# Patient Record
Sex: Female | Born: 1965
Health system: Southern US, Community
[De-identification: ages and names within clinical notes are randomized; demographics above are authoritative.]

## PROBLEM LIST (undated history)

## (undated) DIAGNOSIS — M722 Plantar fascial fibromatosis: Secondary | ICD-10-CM

## (undated) DIAGNOSIS — R7303 Prediabetes: Secondary | ICD-10-CM

## (undated) DIAGNOSIS — Z9289 Personal history of other medical treatment: Secondary | ICD-10-CM

## (undated) DIAGNOSIS — K219 Gastro-esophageal reflux disease without esophagitis: Secondary | ICD-10-CM

## (undated) DIAGNOSIS — C9590 Leukemia, unspecified not having achieved remission: Secondary | ICD-10-CM

## (undated) DIAGNOSIS — K589 Irritable bowel syndrome without diarrhea: Secondary | ICD-10-CM

## (undated) DIAGNOSIS — Z8719 Personal history of other diseases of the digestive system: Secondary | ICD-10-CM

## (undated) DIAGNOSIS — C4490 Unspecified malignant neoplasm of skin, unspecified: Secondary | ICD-10-CM

## (undated) DIAGNOSIS — E041 Nontoxic single thyroid nodule: Secondary | ICD-10-CM

## (undated) DIAGNOSIS — K6289 Other specified diseases of anus and rectum: Secondary | ICD-10-CM

## (undated) DIAGNOSIS — D649 Anemia, unspecified: Secondary | ICD-10-CM

## (undated) HISTORY — DX: Other specified diseases of anus and rectum: K62.89

## (undated) HISTORY — PX: PLANTAR FASCIA SURGERY: SHX746

## (undated) HISTORY — DX: Anemia, unspecified: D64.9

## (undated) HISTORY — DX: Plantar fascial fibromatosis: M72.2

## (undated) HISTORY — DX: Unspecified malignant neoplasm of skin, unspecified: C44.90

## (undated) HISTORY — DX: Leukemia, unspecified not having achieved remission: C95.90

## (undated) HISTORY — DX: Nontoxic single thyroid nodule: E04.1

## (undated) HISTORY — DX: Irritable bowel syndrome, unspecified: K58.9

## (undated) HISTORY — DX: Gastro-esophageal reflux disease without esophagitis: K21.9

## (undated) HISTORY — PX: RADICAL HYSTERECTOMY: SHX2283

---
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
## 1997-09-24 HISTORY — PX: NASAL SINUS SURGERY: SHX719

## 1998-02-08 ENCOUNTER — Other Ambulatory Visit: Admission: RE | Admit: 1998-02-08 | Discharge: 1998-02-08 | Payer: Self-pay | Admitting: *Deleted

## 1999-10-12 ENCOUNTER — Other Ambulatory Visit: Admission: RE | Admit: 1999-10-12 | Discharge: 1999-10-12 | Payer: Self-pay | Admitting: Obstetrics and Gynecology

## 2000-10-15 ENCOUNTER — Other Ambulatory Visit: Admission: RE | Admit: 2000-10-15 | Discharge: 2000-10-15 | Payer: Self-pay | Admitting: Obstetrics and Gynecology

## 2000-11-05 ENCOUNTER — Ambulatory Visit (HOSPITAL_COMMUNITY): Admission: RE | Admit: 2000-11-05 | Discharge: 2000-11-05 | Payer: Self-pay | Admitting: Internal Medicine

## 2000-11-05 ENCOUNTER — Encounter: Payer: Self-pay | Admitting: Internal Medicine

## 2001-09-24 HISTORY — PX: ABDOMINAL HYSTERECTOMY: SHX81

## 2001-09-25 ENCOUNTER — Ambulatory Visit (HOSPITAL_COMMUNITY): Admission: RE | Admit: 2001-09-25 | Discharge: 2001-09-25 | Payer: Self-pay | Admitting: Gynecology

## 2001-09-25 ENCOUNTER — Encounter (INDEPENDENT_AMBULATORY_CARE_PROVIDER_SITE_OTHER): Payer: Self-pay

## 2001-11-04 ENCOUNTER — Other Ambulatory Visit: Admission: RE | Admit: 2001-11-04 | Discharge: 2001-11-04 | Payer: Self-pay | Admitting: Gynecology

## 2002-03-19 ENCOUNTER — Inpatient Hospital Stay (HOSPITAL_COMMUNITY): Admission: RE | Admit: 2002-03-19 | Discharge: 2002-03-22 | Payer: Self-pay | Admitting: Gynecology

## 2002-03-19 ENCOUNTER — Encounter (INDEPENDENT_AMBULATORY_CARE_PROVIDER_SITE_OTHER): Payer: Self-pay | Admitting: Specialist

## 2002-09-24 HISTORY — PX: OTHER SURGICAL HISTORY: SHX169

## 2002-10-13 ENCOUNTER — Other Ambulatory Visit: Admission: RE | Admit: 2002-10-13 | Discharge: 2002-10-13 | Payer: Self-pay | Admitting: Gynecology

## 2003-07-12 ENCOUNTER — Emergency Department (HOSPITAL_COMMUNITY): Admission: AD | Admit: 2003-07-12 | Discharge: 2003-07-12 | Payer: Self-pay | Admitting: Family Medicine

## 2003-12-01 ENCOUNTER — Other Ambulatory Visit: Admission: RE | Admit: 2003-12-01 | Discharge: 2003-12-01 | Payer: Self-pay | Admitting: Gynecology

## 2005-01-09 ENCOUNTER — Other Ambulatory Visit: Admission: RE | Admit: 2005-01-09 | Discharge: 2005-01-09 | Payer: Self-pay | Admitting: Gynecology

## 2005-12-20 ENCOUNTER — Ambulatory Visit (HOSPITAL_COMMUNITY): Admission: RE | Admit: 2005-12-20 | Discharge: 2005-12-20 | Payer: Self-pay | Admitting: Gynecology

## 2006-01-14 ENCOUNTER — Other Ambulatory Visit: Admission: RE | Admit: 2006-01-14 | Discharge: 2006-01-14 | Payer: Self-pay | Admitting: Gynecology

## 2006-12-24 ENCOUNTER — Ambulatory Visit (HOSPITAL_COMMUNITY): Admission: RE | Admit: 2006-12-24 | Discharge: 2006-12-24 | Payer: Self-pay | Admitting: Gynecology

## 2007-01-16 ENCOUNTER — Other Ambulatory Visit: Admission: RE | Admit: 2007-01-16 | Discharge: 2007-01-16 | Payer: Self-pay | Admitting: Gynecology

## 2007-03-24 ENCOUNTER — Encounter: Admission: RE | Admit: 2007-03-24 | Discharge: 2007-03-24 | Payer: Self-pay | Admitting: Family Medicine

## 2007-12-14 ENCOUNTER — Encounter: Admission: RE | Admit: 2007-12-14 | Discharge: 2007-12-14 | Payer: Self-pay | Admitting: Family Medicine

## 2007-12-31 ENCOUNTER — Ambulatory Visit (HOSPITAL_COMMUNITY): Admission: RE | Admit: 2007-12-31 | Discharge: 2007-12-31 | Payer: Self-pay | Admitting: Gynecology

## 2008-01-19 ENCOUNTER — Other Ambulatory Visit: Admission: RE | Admit: 2008-01-19 | Discharge: 2008-01-19 | Payer: Self-pay | Admitting: Gynecology

## 2008-04-28 ENCOUNTER — Emergency Department (HOSPITAL_COMMUNITY): Admission: EM | Admit: 2008-04-28 | Discharge: 2008-04-29 | Payer: Self-pay | Admitting: Emergency Medicine

## 2008-11-10 ENCOUNTER — Ambulatory Visit: Payer: Self-pay | Admitting: Women's Health

## 2008-12-31 ENCOUNTER — Ambulatory Visit (HOSPITAL_COMMUNITY): Admission: RE | Admit: 2008-12-31 | Discharge: 2008-12-31 | Payer: Self-pay | Admitting: Gynecology

## 2009-01-24 ENCOUNTER — Ambulatory Visit: Payer: Self-pay | Admitting: Women's Health

## 2009-01-24 ENCOUNTER — Other Ambulatory Visit: Admission: RE | Admit: 2009-01-24 | Discharge: 2009-01-24 | Payer: Self-pay | Admitting: Gynecology

## 2009-01-24 ENCOUNTER — Encounter: Payer: Self-pay | Admitting: Women's Health

## 2009-02-09 ENCOUNTER — Ambulatory Visit: Payer: Self-pay | Admitting: Obstetrics and Gynecology

## 2009-09-21 ENCOUNTER — Encounter: Admission: RE | Admit: 2009-09-21 | Discharge: 2009-09-21 | Payer: Self-pay | Admitting: Gastroenterology

## 2009-11-22 ENCOUNTER — Emergency Department (HOSPITAL_COMMUNITY): Admission: EM | Admit: 2009-11-22 | Discharge: 2009-11-22 | Payer: Self-pay | Admitting: Family Medicine

## 2009-11-22 HISTORY — PX: BIOPSY THYROID: PRO38

## 2009-11-30 ENCOUNTER — Other Ambulatory Visit: Admission: RE | Admit: 2009-11-30 | Discharge: 2009-11-30 | Payer: Self-pay | Admitting: Diagnostic Radiology

## 2009-11-30 ENCOUNTER — Encounter: Admission: RE | Admit: 2009-11-30 | Discharge: 2009-11-30 | Payer: Self-pay | Admitting: Family Medicine

## 2009-12-30 ENCOUNTER — Encounter: Admission: RE | Admit: 2009-12-30 | Discharge: 2009-12-30 | Payer: Self-pay | Admitting: Gynecology

## 2010-01-25 ENCOUNTER — Other Ambulatory Visit: Admission: RE | Admit: 2010-01-25 | Discharge: 2010-01-25 | Payer: Self-pay | Admitting: Obstetrics and Gynecology

## 2010-01-25 ENCOUNTER — Ambulatory Visit: Payer: Self-pay | Admitting: Women's Health

## 2010-05-11 ENCOUNTER — Ambulatory Visit: Payer: Self-pay | Admitting: Women's Health

## 2010-11-29 ENCOUNTER — Ambulatory Visit
Admission: RE | Admit: 2010-11-29 | Discharge: 2010-11-29 | Disposition: A | Discharge status: Home / Self Care | Payer: BC Managed Care – PPO | Source: Ambulatory Visit | Attending: Allergy | Admitting: Allergy

## 2010-11-29 ENCOUNTER — Other Ambulatory Visit: Payer: Self-pay | Admitting: Allergy

## 2010-11-29 DIAGNOSIS — R05 Cough: Secondary | ICD-10-CM

## 2010-11-29 DIAGNOSIS — R062 Wheezing: Secondary | ICD-10-CM

## 2010-12-12 ENCOUNTER — Ambulatory Visit (INDEPENDENT_AMBULATORY_CARE_PROVIDER_SITE_OTHER): Payer: BC Managed Care – PPO | Admitting: Gynecology

## 2010-12-12 DIAGNOSIS — N898 Other specified noninflammatory disorders of vagina: Secondary | ICD-10-CM

## 2010-12-12 DIAGNOSIS — B373 Candidiasis of vulva and vagina: Secondary | ICD-10-CM

## 2010-12-12 DIAGNOSIS — M549 Dorsalgia, unspecified: Secondary | ICD-10-CM

## 2010-12-13 ENCOUNTER — Other Ambulatory Visit: Payer: Self-pay | Admitting: Gynecology

## 2010-12-13 DIAGNOSIS — Z1231 Encounter for screening mammogram for malignant neoplasm of breast: Secondary | ICD-10-CM

## 2011-01-01 ENCOUNTER — Ambulatory Visit
Admission: RE | Admit: 2011-01-01 | Discharge: 2011-01-01 | Disposition: A | Discharge status: Home / Self Care | Payer: BC Managed Care – PPO | Source: Ambulatory Visit | Attending: Gynecology | Admitting: Gynecology

## 2011-01-01 DIAGNOSIS — Z1231 Encounter for screening mammogram for malignant neoplasm of breast: Secondary | ICD-10-CM

## 2011-01-10 ENCOUNTER — Ambulatory Visit (INDEPENDENT_AMBULATORY_CARE_PROVIDER_SITE_OTHER): Payer: BC Managed Care – PPO | Admitting: Women's Health

## 2011-01-10 DIAGNOSIS — N898 Other specified noninflammatory disorders of vagina: Secondary | ICD-10-CM

## 2011-01-10 DIAGNOSIS — N3941 Urge incontinence: Secondary | ICD-10-CM

## 2011-01-10 DIAGNOSIS — R350 Frequency of micturition: Secondary | ICD-10-CM

## 2011-01-10 DIAGNOSIS — B373 Candidiasis of vulva and vagina: Secondary | ICD-10-CM

## 2011-01-10 DIAGNOSIS — R82998 Other abnormal findings in urine: Secondary | ICD-10-CM

## 2011-01-23 ENCOUNTER — Encounter (INDEPENDENT_AMBULATORY_CARE_PROVIDER_SITE_OTHER): Payer: Self-pay | Admitting: Surgery

## 2011-01-29 ENCOUNTER — Other Ambulatory Visit: Payer: Self-pay | Admitting: Women's Health

## 2011-01-29 ENCOUNTER — Encounter (INDEPENDENT_AMBULATORY_CARE_PROVIDER_SITE_OTHER): Payer: BC Managed Care – PPO | Admitting: Women's Health

## 2011-01-29 ENCOUNTER — Other Ambulatory Visit (HOSPITAL_COMMUNITY)
Admission: RE | Admit: 2011-01-29 | Discharge: 2011-01-29 | Disposition: A | Discharge status: Home / Self Care | Payer: BC Managed Care – PPO | Source: Ambulatory Visit | Attending: Gynecology | Admitting: Gynecology

## 2011-01-29 DIAGNOSIS — Z01419 Encounter for gynecological examination (general) (routine) without abnormal findings: Secondary | ICD-10-CM

## 2011-01-29 DIAGNOSIS — E079 Disorder of thyroid, unspecified: Secondary | ICD-10-CM

## 2011-01-29 DIAGNOSIS — Z124 Encounter for screening for malignant neoplasm of cervix: Secondary | ICD-10-CM | POA: Insufficient documentation

## 2011-01-29 DIAGNOSIS — Z13220 Encounter for screening for lipoid disorders: Secondary | ICD-10-CM

## 2011-02-09 NOTE — H&P (Signed)
[facility]  Patient:    Marissa [name], Marissa [name] Visit Number: [id number] MRN: [id number]          Service Type: GYN Location: 9300 [id number] Attending Physician:  Douglass Rivers Dictated by:   Douglass Rivers, M.D. Admit Date:  03/19/2002                           History and Physical  CHIEF COMPLAINT:              History of endometriosis, failed conservative management.  HISTORY OF PRESENT ILLNESS:   Patient is a 45 year old gravida 0 with approximately a 82-month history of pelvic pain which she states was approximately 20 of every 30 days, initially began on the right but, over the past month, has been increasing being more concentrated on the left, sometimes radiation around her back, most often radiation down her legs.  She had been evaluated by sports medicine including an MRI of the hip and pelvis which was remarkable only for a hydrosalpinx that was confirmed by an ultrasound.  The patient reports dyspareunia.  She underwent a laparoscopy in January 2002 and was indeed found to have endometriosis in her pelvis, left side greater than the right.  She was also noted to have a hydrosalpinx on the left which we thought might also attribute to her disease and was removed.  The patients cul de sac was felt to be inadequately treated and a lot of her disease was felt to be peritoneal.  As a result of that, she was placed on Depo-Lupron which she has been on since surgery, initially did well.  But, when she got her period, again had a flare of her pain.  The pain has not ever resolved. Since surgery, it is slightly better.  She is becoming increasingly tolerant of the side effects of Lupron, mainly the sleeplessness and leg cramps and, unfortunately, was intolerant of the quinine.  She was given Effexor and Elavil also to counter the effects of the Lupron but discontinued those, as well.  The patient feels that she is not interested in childbearing and  after six months of therapy without resolution of the symptoms, she would prefer to have definitive therapy including a hysterectomy with bilateral salpingo-oophorectomy and fulguration of any grossly evident endometriosis that we may see at the time.  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  She has never been able to obtain a pregnancy and is not currently interested in childbearing at this time.  PAST MEDICAL HISTORY:         Significant for ALL.  She has been disease-free and cleared by the oncologist for over 10 years now.  She had a TSH and prolactin done which were normal as well as an FSH despite which she has mostly irregular periods.  PAST SURGICAL HISTORY:        Significant for laparoscopy in January with fulguration of endometriosis and a surgery for a draining chalazion in 1979 and 1982.  MEDICATIONS:                  She is on allergy medications of Flonase and Allegra p.r.n.  She is currently on Lupron.  She discontinued her Elavil,  quinine, and Effexor.  ALLERGIES:                    ALL SEASONAL.  SOCIAL HISTORY:               She is single.  Negative for tobacco.  Social alcohol.  Rare caffeine and rare exercise.  FAMILY HISTORY:               Significant for heart disease in her father as well as diabetes in both her father and mother.  PHYSICAL EXAMINATION:  GENERAL:                      She is a well-appearing female in no acute distress.  HEENT:                        Unremarkable.  NECK:                         The thyroid is smooth, nontender, and mobile.  HEART:                        Regular rate.  LUNGS:                        Clear to auscultation.  BREASTS:                      Without mass, discharge,  retractions in supine and upright position.  ABDOMEN:                      Obese, soft, nontender.  GYNECOLOGIC:                  She has normal external female genitalia.  The BUS is negative.  The vagina is pink and moist.  There is slight cervical tenderness.  The uterus is anteverted and mobile.  Slight tenderness on the left.  The right adnexa is negative.  However, rectovaginal exam shows marked tenderness of the left uterosacral ligament, cul de sac, and posterior uterus, as well as the side wall on the left.  ASSESSMENT:                   Documented endometriosis with failed conservative management with Lupron.  PLAN:                         A total abdominal hysterectomy, bilateral salpingo-oophorectomy, fulguration of the endometriosis of the cul de sac as well as left side wall.  All questions were addressed.  The patient will present in the morning of June 26 for definitive surgery. Dictated by:   Douglass Rivers, M.D. Attending Physician:  Douglass Rivers DD:  03/18/02 TD:  03/18/02 Job: [id number]

## 2011-02-09 NOTE — Op Note (Signed)
[facility]  Patient:    Marissa [name], Marissa [name] Visit Number: [id number] MRN: [id number]          Service Type: GYN Location: 9300 [id number] Attending Physician:  Douglass Rivers Dictated by:   Douglass Rivers, M.D. Proc. Date: 03/19/02 Admit Date:  03/19/2002                             Operative Report  PREOPERATIVE DIAGNOSIS:       1. Pelvic pain.                               2. Endometriosis.                               3. History of ALL.  POSTOPERATIVE DIAGNOSES:      1. Pelvic pain.                               2. Endometriosis.                               3. History of ALL.  PROCEDURES:                   1. Total abdominal hysterectomy                               2. Right salpingo-oophorectomy.                               3. Left oophorectomy.  SURGEON:                      Douglass Rivers, M.D.  ASSISTANT:                    Katy Fitch, M.D.  ANESTHESIA:                   General.  ESTIMATED BLOOD LOSS:         100 cc.  URINE OUTPUT:                 400 cc clear urine.  FINDINGS:                     ________ perineum, anterior and posterior cul-de-sac with some scarring of the colon to the posterior sacrals.  The uterus and ovaries were unremarkable.  COMPLICATIONS:                None.  PATHOLOGY:                    Uterus, right tube and ovary and left ovary.  DESCRIPTION OF PROCEDURE:     The patient was taken to the operating room. General anesthesia was induced.  The patient was placed in dorsal supine position, then prepped and draped in the usual sterile fashion.  A Pfannenstiel skin incision was made with the scalpel, carried through to the underlying layer of fascia with the second blade.  The fascia was scored in the midline; the incision was extended laterally  with the Mayo scissors.  The inferior aspect of the fascial incision was done with Kochers.  The underlying rectus muscles were dissected off by blunt and  sharp dissection. In a similar fashion the superior aspect of the incision was grasped with Kochers, and left lying rectus muscles were dissected off.  The rectus muscles were separated in the midline and peritoneum was entered bluntly. Peritoneal incision was done inferiorly and superiorly.  Inferiorly there was good visualization of underlying bladder.  The bowels were packed away with moist pack lap x3, and Balfour retractor was then placed in the incision.  The uterine angles were then grasped with long Kelly clamps.  The round ligament was identified, transected and suture ligated bilaterally.  The anterior leaf of the broad ligament was incised.  The bladder flap was attempted.  The peritoneum was noted to be morbidly thickened, and the bladder was markedly adherent to the uterus anteriorly.  The posterior leaf was then extended and entered bluntly.  The infundibulopelvic ligament on the right was then transected and suture ligated.  Then similarly on the left retroperitoneal space was identified.  The ureter on that side was unable to be identified; however, the peritoneum over the IP was opened and the two vessels were clearly seen; transected and then suture ligated x2.  The uterine vessels were skeletonized; again the bladder was sharply taken down -- it was markedly scarred anteriorly.  The uterine vessels were then clamped, and that was tied with 0 Vicryl.  This, again, was done bilaterally.  The cardinal ligaments were also taken down sharply.  Careful attention to the bladder anteriorly, that again continued to be sharply taken down.  As we progressed down the cervix, the bowel was noted to be markedly adherent to the posterior cul-de-sac and uterosacral ligaments.  It was felt that this was not going to be safe to take any further bites with peritoneum opened posteriorly, after the uterus was transected and the rectovaginal spaces opened.  Once the space was opened we were  able to adequately dissect down.  The uterosacral ligaments were transected, suture ligated and held.  The cervix was then sharply dissected off.  The vaginal mucosa was then inverted and transfixed to the epsilateral cardinal ligament.  The vagina and posterior peritoneum were closed in three figure-of-eight sutures, and noted to hemostatic.  The pelvis was irrigated with copious amounts of warm saline, and hemostasis posteriorly as well as anteriorly was achieved.  Inspection of all pedicles also assured [place] of hemostasis.  The retractor was then removed as well as the lap sponges. There was some bleeding noted in the empiric bladder, and this is treated with cautery.  The pelvis again was irrigated and hemostasis of this area was assured.  The retractor was removed.  We gently separated the rectus muscles in the midline and reinspected the area over the bladder; failed to see any blood welling up into the area.  At this point the fascia was closed with 0 Vicryl in a running fashion bilaterally.  The subcutaneous was irrigated. Areas on the fascia, muscle, peritoneum and subcutaneous were treated appropriately with electrocautery.  The dead space was reapproximated with three interrupted sutures of 2-0 plain.  The skin was closed with staples. The patient tolerated the procedure well.  Sponge, lap and needle counts were correct x2.  She was transferred to the PACU in stable condition. Dictated by:   Douglass Rivers, M.D. Attending Physician:  Douglass Rivers DD:  03/19/02 TD:  03/20/02 Job: [id number]

## 2011-02-09 NOTE — Op Note (Signed)
[facility]  Patient:    Marissa [name], Marissa [name] Visit Number: [id number] MRN: [id number]          Service Type: DSU Location: [facility] Attending Physician:  Douglass Rivers Dictated by:   Douglass Rivers, M.D. Proc. Date: 09/25/01 Admit Date:  09/25/2001                             Operative Report  PREOPERATIVE DIAGNOSES:       1. Pelvic pain.                               2. Left hydrosalpinx.  POSTOPERATIVE DIAGNOSIS:      1. Pelvic pain.                               2. Left hydrosalpinx.                               3. Clinical endometriosis.  PROCEDURES:                   1. Left salpingectomy.                               2. Lysis of adhesions.                               3. Fulguration of endometriosis.  SURGEON:                      Douglass Rivers, M.D.  ANESTHESIA:                   General.  ESTIMATED BLOOD LOSS:         Minimal.  FINDINGS:                     There was grittiness noted of the uterosacral ligaments bilaterally.  Black plaques noted after treatment.  White plaques in the cul-de-sac.  Left hydrosalpinx.  Both ovaries and the right tube were normal.  Normal-appearing appendix and upper abdomen.  COMPLICATIONS:                None.  PATHOLOGY:                    Left tube.  INDICATIONS:                  The patient is a 45 year old G0 with a history of increasing pelvic pain since April 2002 with radiation to the gluteal area and right hip.  CT scan and MRI were remarkable only for a hydrosalpinx on the left.  She was noted to have tender uterosacral ligaments that appeared gritty on bimanual exam.  She presented for definitive therapy.  DESCRIPTION OF PROCEDURE:     The patient was taken to the operating room. General anesthesia was induced and she was placed in the dorsal lithotomy position, prepped and draped in the usual sterile fashion.  a bivalve speculum was placed in the vagina.  The cervix was visualized.  The  uterine manipulator was placed.  Gloves were changed.  Attention was then  turned to the abdomen, where an infraumbilical incision was made with a scalpel, through which a Veress needle was inserted.  Opening pressure was 4.  Pneumoperitoneum was created until tympani was appreciated above the liver.  The Veress needle was then removed and the #10 disposable trocar was inserted through the infraumbilical port.  Placement in the abdominal cavity was confirmed.  The patient was then placed in Trendelenburg position and inspection of the pelvis ensued.  Initially, the pelvis was only remarkable for the left hydrosalpinx. However, the patient had a long pelvis, and it was difficult to truly visualize the cul-de-sac.  Two 5 mm ports were placed laterally with careful attention to vessels.  Under direct visualization using these two ports, the bowel was able to be removed from the cul-de-sac.  The pelvis was irrigated with warm saline, at this point we were able to note a grittiness on the uterosacral ligaments bilaterally.  There also were thickened white plaques in the cul-de-sac.  A red plaque was also noted in the cul-de-sac.  THe ovaries were elevated and noted to be normal.  The peritoneum was noted to be normal there.  The anterior cul-de-sac was also unremarkable.  The left tube was then grasped at the fimbriated end and held on tension.  The proximal portion was treated with the Kleppinger in three serial regions and then sharply transected.  The tube was elevated and the mesosalpinx was also treated with cautery and then sharply dissected off.  The area was noted to be hemostatic. The tube was removed from the infraumbilical port with the trocar in place. Attention was then turned toward the cul-de-sac.  The uterus was held on gentle tension and the uterosacral ligaments were treated with a sweeping fashion with cautery.  As we cauterized, what appeared to be definitive black plaques  consistent with endometriosis was seen.  There were also a few scattered areas on the left of peritoneum that were able to be elevated and again treated with cautery.  In a similar fashion, black plaques were noted afterward.  Both uterosacral ligaments were treated.  The disease was more extensive on the left than on the right.  The area in the cul-de-sac was also treated with cautery.  The pelvis was then inspected and felt to be adequately treated.  There were bowel adhesions on the left proximal to where the left tube had been.  These filmy adhesions were sharply dissected off after hydrodissection.  A small area of bleeding on the side wall was treated with cautery and made hemostatic.  The appendix was identified and noted to be normal.  The upper abdomen, liver and gallbladder were also noted to be unremarkable.  The trocars were then removed under direct visualization. Pneumoperitoneum was also released under direct visualization.  The infraumbilical fascia was grasped with an Allis clamp, elevated and closed with a figure-of-eight 0 Vicryl.  The subcu was closed with 3-0 plain on the infraumbilical port only.  The three ports were injected with a total of 13 c of 0.25% Marcaine solution.  The 5 mm ports were reapproximated with tincture of benzoin and Steri-Strips.  Instruments were removed from the vagina and the cervix was noted to be hemostatic.  The patient tolerated the procedure well. Sponge, lap, and needle counts were correct x 2.  She was extubated in the OR and transferred to the PACU in stable condition. Dictated by:   Douglass Rivers, M.D. Attending Physician:  Douglass Rivers DD:  09/25/01 TD:  09/25/01 Job: [id number]

## 2011-02-09 NOTE — Discharge Summary (Signed)
[facility]  Patient:    Marissa [name], Marissa [name] Visit Number: [id number] MRN: [id number]          Service Type: GYN Location: 9300 [id number] Attending Physician:  Douglass Rivers Dictated by:   Antony Contras, [facility] Admit Date:  03/19/2002 Discharge Date: 03/22/2002                             Discharge Summary  DISCHARGE DIAGNOSES:          1. Pelvic pain.                               2. Endometriosis.                               3. History of acute lymphoblastic leukemia.  PROCEDURES:                   Total abdominal hysterectomy, right salpingo-oophorectomy, left oophorectomy.  HISTORY OF PRESENT ILLNESS:   The patient is a 45 year old nulliparous female with a 72-month history of pelvic pain which she states was approximately 20 to every 30 days.  She did undergo a laparoscopy in January 2002 and was found to have endometriosis in her pelvis and also noted to have a hydrosalpinx on the left, which was thought to be attributing to her disease and was removed. The patient also was placed on Depot Lupron after surgery.  Initially did well but then did have a flare of her pain after her period.  The patient did have a history of ALL.  She has been disease free for 10 years.  She has had a history of irregular periods, which was felt to be secondary to her chemotherapy and radiation.  She is now currently admitted for definitive therapy.  HOSPITAL COURSE AND TREATMENT:                    Total abdominal hysterectomy, right salpingo-oophorectomy, left oophorectomy performed by Dr. Farrel Gobble and assisted by Dr. Penni Homans under general anesthesia.  Findings included anteroposterior questionable/perineum, anterior and posterior cul-de-sac with some scarring of the colon, posterior sacrals.  Uterus and ovaries were unremarkable.  POSTOPERATIVE COURSE:         The patient remained afebrile and was able to be discharged on her second postoperative day in  satisfactory condition.  DISPOSITION:                  Followup in two weeks.  The patient already has pain medication at home.  LABORATORY DATA:              Postoperative CBC:  Hematocrit 34.1, hemoglobin 11.5, WBC 10.2, platelets 274. Dictated by:   Antony Contras, [facility] Attending Physician:  Douglass Rivers DD:  04/09/02 TD:  04/16/02 Job: [id number]

## 2011-03-21 ENCOUNTER — Encounter (INDEPENDENT_AMBULATORY_CARE_PROVIDER_SITE_OTHER): Payer: BC Managed Care – PPO

## 2011-03-21 DIAGNOSIS — Z13820 Encounter for screening for osteoporosis: Secondary | ICD-10-CM

## 2011-04-26 ENCOUNTER — Encounter: Payer: Self-pay | Admitting: Women's Health

## 2011-04-26 ENCOUNTER — Ambulatory Visit (INDEPENDENT_AMBULATORY_CARE_PROVIDER_SITE_OTHER): Payer: BC Managed Care – PPO | Admitting: Women's Health

## 2011-04-26 VITALS — BP 150/80

## 2011-04-26 DIAGNOSIS — N898 Other specified noninflammatory disorders of vagina: Secondary | ICD-10-CM

## 2011-04-26 DIAGNOSIS — B373 Candidiasis of vulva and vagina: Secondary | ICD-10-CM

## 2011-04-26 DIAGNOSIS — R350 Frequency of micturition: Secondary | ICD-10-CM

## 2011-04-26 MED ORDER — TERCONAZOLE 0.8 % VA CREA: 1.0000 | TOPICAL_CREAM | Freq: Every day | VAGINAL | Status: AC

## 2011-04-26 MED ORDER — NITROFURANTOIN MONOHYD MACRO 100 MG PO CAPS: 100.0000 mg | ORAL_CAPSULE | Freq: Two times a day (BID) | ORAL | Status: AC

## 2011-04-26 NOTE — Progress Notes (Signed)
  Presents with several complaints. States is having some vaginal itching and increased frequency, urgency, right-sided lower back pain. Of significance she is morbidly obese especially in the trunk area. She had been on a patient at the beach last [place] and had increased frequency of intercourse. UA is completely negative, will check a urine culture. States she was up 3-4 times last night, and that is not her normal she states she sleeps all night. We'll treat with Macrobid one by mouth twice a day for 7 days. If the urine culture is negative will only take for 3 days. Reviewed UTI prevention. External genitalia is erythematous and introitus speculum exam moderate amount of curdy discharge was noted. Wet prep is positive for yeast she does have a history of a TAH/BSO and is not on no ERT. We'll treat with Terazol 3 one applicator HS x3 prescription properties was given and reviewed. Will call or return if no relief. Reviewed importance of losing some weight especially in her abdomen, which may help with some her frequency and urgency which she has had a problem with in the past.

## 2011-04-26 NOTE — Progress Notes (Signed)
Addended by: Landis Martins R on: 04/26/2011 11:18 AM   Modules accepted: Orders

## 2011-04-27 ENCOUNTER — Telehealth: Payer: Self-pay | Admitting: Women's Health

## 2011-04-27 NOTE — Telephone Encounter (Signed)
Left message on cell phone. Informed urine culture was positive for 25,000 pure . Will finish out the Macrobid  for one week, return to the office if still symptoms.

## 2011-04-30 ENCOUNTER — Telehealth: Payer: Self-pay | Admitting: Women's Health

## 2011-04-30 NOTE — Telephone Encounter (Signed)
Left message on his cell phone to finish out antibiotic. Urine culture did show 25,000 pure, she was treated at the office visit with classic UTI symptoms. Instructed to call or return if no relief of symptoms.

## 2011-05-07 ENCOUNTER — Other Ambulatory Visit: Payer: Self-pay | Admitting: Family Medicine

## 2011-05-12 ENCOUNTER — Ambulatory Visit
Admission: RE | Admit: 2011-05-12 | Discharge: 2011-05-12 | Disposition: A | Discharge status: Home / Self Care | Payer: BC Managed Care – PPO | Source: Ambulatory Visit | Attending: Family Medicine | Admitting: Family Medicine

## 2011-07-16 ENCOUNTER — Other Ambulatory Visit (INDEPENDENT_AMBULATORY_CARE_PROVIDER_SITE_OTHER): Payer: Self-pay | Admitting: Surgery

## 2011-07-16 DIAGNOSIS — E041 Nontoxic single thyroid nodule: Secondary | ICD-10-CM

## 2011-07-17 ENCOUNTER — Ambulatory Visit
Admission: RE | Admit: 2011-07-17 | Discharge: 2011-07-17 | Disposition: A | Discharge status: Home / Self Care | Payer: BC Managed Care – PPO | Source: Ambulatory Visit | Attending: Surgery | Admitting: Surgery

## 2011-07-17 ENCOUNTER — Encounter (INDEPENDENT_AMBULATORY_CARE_PROVIDER_SITE_OTHER): Payer: Self-pay | Admitting: Surgery

## 2011-07-17 DIAGNOSIS — E041 Nontoxic single thyroid nodule: Secondary | ICD-10-CM

## 2011-07-18 ENCOUNTER — Ambulatory Visit (INDEPENDENT_AMBULATORY_CARE_PROVIDER_SITE_OTHER): Payer: BC Managed Care – PPO | Admitting: Surgery

## 2011-07-18 ENCOUNTER — Encounter (INDEPENDENT_AMBULATORY_CARE_PROVIDER_SITE_OTHER): Payer: Self-pay | Admitting: Surgery

## 2011-07-18 VITALS — BP 132/88 | HR 64 | Temp 97.4°F | Resp 20 | Ht 63.5 in | Wt 273.2 lb

## 2011-07-18 DIAGNOSIS — E042 Nontoxic multinodular goiter: Secondary | ICD-10-CM | POA: Insufficient documentation

## 2011-07-18 NOTE — Progress Notes (Signed)
Visit Diagnoses: 1. Multinodular goiter (nontoxic), dominant nodule left lobe     HISTORY: Patient returns for scheduled followup of thyroid nodule. It has been one year since her last office visit. Patient notes no particular change in self-examination. She has developed no new symptoms.  At my request the patient underwent a followup thyroid ultrasound. This shows a normal sized right thyroid lobe at 5.0 cm. Left thyroid lobe was enlarged at 6.8 cm. The dominant nodule in the left lobe has become less distinct. The cystic component has diminished while the solid component remains. Margins are indistinct. Overall the nodule measures approximately 2.9 cm in size which is slightly decreased from my prior measurement of 3.2 cm. No lymphadenopathy is identified.  Patient had a TSH level drawn yesterday. It is normal at 2.6-2 on no thyroid medication.   PERTINENT REVIEW OF SYSTEMS: Patient denies compressive symptoms. She denies tremor. She denies palpitations.   EXAM: HEENT: normocephalic; pupils equal and reactive; sclerae clear; dentition good; mucous membranes moist NECK:  Palpation of the neck shows no dominant or discrete masses. There is some fullness in the lower left neck. It is difficult to appreciate a nodule as it extends beneath the left clavicle; symmetric on extension; no palpable anterior or posterior cervical lymphadenopathy; no supraclavicular masses; no tenderness CHEST: clear to auscultation bilaterally without rales, rhonchi, or wheezes CARDIAC: regular rate and rhythm without significant murmur; peripheral pulses are full EXT:  non-tender without edema; no deformity NEURO: no gross focal deficits; no sign of tremor   IMPRESSION: #1 left thyroid nodule, 2.9 cm, less distinct than on previous studies #2 small thyroid goiter   PLAN: The patient will return in one year for followup. We will repeat a thyroid ultrasound and a TSH level at that time. At this point she does  not require any intervention nor thyroid hormone supplementation.   Marissa Heckler, MD, FACS General & Endocrine Surgery [facility]

## 2011-07-24 ENCOUNTER — Encounter: Payer: Self-pay | Admitting: *Deleted

## 2011-07-24 ENCOUNTER — Ambulatory Visit (INDEPENDENT_AMBULATORY_CARE_PROVIDER_SITE_OTHER): Payer: BC Managed Care – PPO | Admitting: Gynecology

## 2011-07-24 ENCOUNTER — Encounter: Payer: Self-pay | Admitting: Gynecology

## 2011-07-24 VITALS — BP 130/84

## 2011-07-24 DIAGNOSIS — N898 Other specified noninflammatory disorders of vagina: Secondary | ICD-10-CM

## 2011-07-24 DIAGNOSIS — L293 Anogenital pruritus, unspecified: Secondary | ICD-10-CM

## 2011-07-24 DIAGNOSIS — B373 Candidiasis of vulva and vagina: Secondary | ICD-10-CM

## 2011-07-24 MED ORDER — FLUCONAZOLE 200 MG PO TABS: 200.0000 mg | ORAL_TABLET | Freq: Every day | ORAL | Status: AC

## 2011-07-24 NOTE — Progress Notes (Signed)
  Did a prior auth on Fluconazole 200 mg #5.  Was approved through insurance for one year and would be a $5 copay.  Informed [facility] [phone].

## 2011-07-24 NOTE — Progress Notes (Signed)
Patient presents with one-week history of vaginal discharge and itching. She does have a history of recurrent yeast infections in the past.  Patient also has some cracking around her lips that she attributes to yeast the dentist told her that it would cause that at times.  Exam Oral with cracking around corners of her mouth no overt evidence of oral thrush Pelvic external BUS vagina White discharge, bimanual without masses or tenderness.  Assessment and plan: Wet prep is positive for yeast. Given the cracking around her mouth I'm going to treat her with a higher and longer dose of Diflucan 200 daily x5. Follow up if symptoms persist. I discussed the possible treatment for her recurrent yeast such as a suppressive regimen of boric acid suppositories but she wants to wait and see how she responds this higher longer dose of Diflucan.

## 2011-08-03 ENCOUNTER — Other Ambulatory Visit: Payer: Self-pay | Admitting: Gastroenterology

## 2011-08-27 ENCOUNTER — Encounter: Payer: Self-pay | Admitting: Women's Health

## 2011-09-19 ENCOUNTER — Encounter: Payer: Self-pay | Admitting: Women's Health

## 2011-09-19 ENCOUNTER — Ambulatory Visit (INDEPENDENT_AMBULATORY_CARE_PROVIDER_SITE_OTHER): Payer: BC Managed Care – PPO | Admitting: Women's Health

## 2011-09-19 DIAGNOSIS — B373 Candidiasis of vulva and vagina: Secondary | ICD-10-CM

## 2011-09-19 DIAGNOSIS — R319 Hematuria, unspecified: Secondary | ICD-10-CM

## 2011-09-19 DIAGNOSIS — R109 Unspecified abdominal pain: Secondary | ICD-10-CM

## 2011-09-19 DIAGNOSIS — N390 Urinary tract infection, site not specified: Secondary | ICD-10-CM

## 2011-09-19 MED ORDER — FLUCONAZOLE 150 MG PO TABS: 150.0000 mg | ORAL_TABLET | Freq: Once | ORAL | Status: AC

## 2011-09-19 MED ORDER — NITROFURANTOIN MONOHYD MACRO 100 MG PO CAPS: 100.0000 mg | ORAL_CAPSULE | Freq: Two times a day (BID) | ORAL | Status: AC

## 2011-09-19 NOTE — Progress Notes (Signed)
Patient ID: Marissa [name], female   DOB: [birth year], 45 y.o.   MRN: [id number] Presents with a complaint of increased urinary frequency, urgency, [name] at the end of the stream of urination. Has also had increased IBS problems. Abdominal [name] mostly on the left lower quadrant. Slight burning at initiation of urination and some vaginal itching. Has had chills none now.  Exam: No CVAT, abdomen morbidly obese, soft, nontender. External genitalia erythematous, speculum exam scant white discharge vaginal walls are also erythematous. Wet prep positive for yeast. UA 15-20 WBCs and +2 bacteria.  UTI Yeast  Plan: Macrobid one by mouth twice a day for 7 days. Instructed to take with food. Diflucan 150 by mouth x1 dose with a refill. Yeast and UTI prevention discussed. Call if no relief of symptoms.

## 2011-09-27 ENCOUNTER — Telehealth: Payer: Self-pay | Admitting: *Deleted

## 2011-09-27 MED ORDER — CIPROFLOXACIN HCL 500 MG PO TABS: 500.0000 mg | ORAL_TABLET | Freq: Two times a day (BID) | ORAL | Status: AC

## 2011-09-27 NOTE — Telephone Encounter (Signed)
PT WAS TREATED FOR UTI ON 09/19/11 AND GIVING MACROBID 100 MG. PT HAS TAKEN ALL RX AND HER SYMPTOMS ARE STILL THERE. PT SYMPTOMS DID GO AWAY BUT LAST COUPLE OF DAY THEY HAVE RETURNED NOT AS SEVERE. PLEASE ADVISE

## 2011-09-27 NOTE — Telephone Encounter (Signed)
Please call patient and call in Cipro 500 by mouth twice a day for 3 days. Office visit for UA and culture if no relief.

## 2011-09-27 NOTE — Telephone Encounter (Signed)
Pt informed with the below note, rx sent to pharmacy. Pt will do the below if no relief.

## 2011-11-28 ENCOUNTER — Encounter: Payer: Self-pay | Admitting: Women's Health

## 2011-11-28 ENCOUNTER — Other Ambulatory Visit: Payer: Self-pay | Admitting: Women's Health

## 2011-11-28 ENCOUNTER — Ambulatory Visit (INDEPENDENT_AMBULATORY_CARE_PROVIDER_SITE_OTHER): Payer: BC Managed Care – PPO | Admitting: Women's Health

## 2011-11-28 DIAGNOSIS — R300 Dysuria: Secondary | ICD-10-CM

## 2011-11-28 DIAGNOSIS — N898 Other specified noninflammatory disorders of vagina: Secondary | ICD-10-CM

## 2011-11-28 DIAGNOSIS — N899 Noninflammatory disorder of vagina, unspecified: Secondary | ICD-10-CM

## 2011-11-28 DIAGNOSIS — N390 Urinary tract infection, site not specified: Secondary | ICD-10-CM

## 2011-11-28 DIAGNOSIS — E669 Obesity, unspecified: Secondary | ICD-10-CM

## 2011-11-28 LAB — WET PREP FOR TRICH, YEAST, CLUE: Trich, Wet Prep: NONE SEEN

## 2011-11-28 LAB — URINALYSIS W MICROSCOPIC + REFLEX CULTURE
Glucose, UA: NEGATIVE mg/dL
Nitrite: NEGATIVE
Protein, ur: NEGATIVE mg/dL
pH: 5 (ref 5.0–8.0)

## 2011-11-28 MED ORDER — NYSTATIN 100000 UNIT/GM EX CREA: TOPICAL_CREAM | Freq: Two times a day (BID) | CUTANEOUS | Status: DC

## 2011-11-28 MED ORDER — FLUCONAZOLE 150 MG PO TABS: 150.0000 mg | ORAL_TABLET | Freq: Once | ORAL | Status: AC

## 2011-11-28 MED ORDER — NITROFURANTOIN MONOHYD MACRO 100 MG PO CAPS: 100.0000 mg | ORAL_CAPSULE | Freq: Two times a day (BID) | ORAL | Status: AC

## 2011-11-28 NOTE — Progress Notes (Signed)
Patient ID: Marissa [name], female   DOB: 08/19/1966, 46 y.o.   MRN: [id number] Presents with complaint of vaginal burning with urination mostly at the initiation of stream, frequency, pressure and some low back discomfort. Denies a fever. Also states is being treated for MRSA/sinus with doxycycline per primary care.  Exam: No CVAT, abdomen obese, nontender. External genitalia is extremely erythematous, wet prep done with a Q-tip, negative wet prep. UA-TNTC WBCs, 21-50 RBCs, few bacteria.  Symptomatic yeast UTI  Plan: Macrobid 1 by mouth twice a day for 7 days with food, nystatin cream to external genitalia twice daily, Diflucan 150 by mouth x1 dose prescription proper use given and reviewed. Urine culture pending. Will call if no relief.

## 2011-11-28 NOTE — Patient Instructions (Signed)

## 2011-12-01 LAB — URINE CULTURE

## 2011-12-10 ENCOUNTER — Ambulatory Visit (INDEPENDENT_AMBULATORY_CARE_PROVIDER_SITE_OTHER): Payer: BC Managed Care – PPO | Admitting: Women's Health

## 2011-12-10 ENCOUNTER — Encounter: Payer: Self-pay | Admitting: Women's Health

## 2011-12-10 DIAGNOSIS — R109 Unspecified abdominal pain: Secondary | ICD-10-CM

## 2011-12-10 DIAGNOSIS — N899 Noninflammatory disorder of vagina, unspecified: Secondary | ICD-10-CM

## 2011-12-10 DIAGNOSIS — N898 Other specified noninflammatory disorders of vagina: Secondary | ICD-10-CM

## 2011-12-10 DIAGNOSIS — R1030 Lower abdominal pain, unspecified: Secondary | ICD-10-CM

## 2011-12-10 LAB — WET PREP FOR TRICH, YEAST, CLUE
Clue Cells Wet Prep HPF POC: NONE SEEN
Trich, Wet Prep: NONE SEEN
Yeast Wet Prep HPF POC: NONE SEEN

## 2011-12-10 LAB — URINALYSIS W MICROSCOPIC + REFLEX CULTURE
Ketones, ur: NEGATIVE mg/dL
Leukocytes, UA: NEGATIVE
Nitrite: NEGATIVE
Specific Gravity, Urine: 1.025 (ref 1.005–1.030)
pH: 5.5 (ref 5.0–8.0)

## 2011-12-10 MED ORDER — FLUCONAZOLE 150 MG PO TABS: 150.0000 mg | ORAL_TABLET | Freq: Once | ORAL | Status: AC

## 2011-12-10 NOTE — Progress Notes (Signed)
Addended by: Venora Maples on: 12/10/2011 02:40 PM   Modules accepted: Orders

## 2011-12-10 NOTE — Progress Notes (Signed)
Patient ID: Marissa [name], female   DOB: 1966/04/14, 46 y.o.   MRN: [id number] Presents with complaint of vaginal irritation, states is somewhat better but not 100% from office visit 11/28/11 when treated for yeast and a UTI. Took one Diflucan 150 for yeast.  Finished Macrobid, urinary symptoms relieved . Denies a fever. History of the TAH/BSO.  UA today negative. Exam: No CVAT, abdomen obese, external genitalia erythematous at introitus. Wet prep done with a Q-tip. Wet prep negative.  Vaginal erythema/irritation  Plan: Continue A&D ointment twice a day,  Diflucan 150 mg times one dose, yeast prevention discussed,  call if symptoms persist.

## 2011-12-12 LAB — URINE CULTURE

## 2011-12-13 ENCOUNTER — Other Ambulatory Visit: Payer: Self-pay | Admitting: *Deleted

## 2011-12-13 MED ORDER — CIPROFLOXACIN HCL 500 MG PO TABS: 500.0000 mg | ORAL_TABLET | Freq: Two times a day (BID) | ORAL | Status: AC

## 2011-12-20 ENCOUNTER — Other Ambulatory Visit: Payer: BC Managed Care – PPO

## 2011-12-20 ENCOUNTER — Other Ambulatory Visit: Payer: Self-pay | Admitting: *Deleted

## 2011-12-20 DIAGNOSIS — N390 Urinary tract infection, site not specified: Secondary | ICD-10-CM

## 2011-12-21 LAB — URINALYSIS W MICROSCOPIC + REFLEX CULTURE
Casts: NONE SEEN
Hgb urine dipstick: NEGATIVE
Leukocytes, UA: NEGATIVE
Nitrite: NEGATIVE
Urobilinogen, UA: 0.2 mg/dL (ref 0.0–1.0)
pH: 5.5 (ref 5.0–8.0)

## 2012-01-09 ENCOUNTER — Other Ambulatory Visit: Payer: Self-pay | Admitting: Gynecology

## 2012-01-09 DIAGNOSIS — Z1231 Encounter for screening mammogram for malignant neoplasm of breast: Secondary | ICD-10-CM

## 2012-01-21 ENCOUNTER — Other Ambulatory Visit: Payer: Self-pay | Admitting: Women's Health

## 2012-01-21 ENCOUNTER — Encounter: Payer: Self-pay | Admitting: Women's Health

## 2012-01-21 ENCOUNTER — Ambulatory Visit (INDEPENDENT_AMBULATORY_CARE_PROVIDER_SITE_OTHER): Payer: BC Managed Care – PPO | Admitting: Women's Health

## 2012-01-21 DIAGNOSIS — N898 Other specified noninflammatory disorders of vagina: Secondary | ICD-10-CM

## 2012-01-21 DIAGNOSIS — C9101 Acute lymphoblastic leukemia, in remission: Secondary | ICD-10-CM | POA: Insufficient documentation

## 2012-01-21 DIAGNOSIS — N809 Endometriosis, unspecified: Secondary | ICD-10-CM | POA: Insufficient documentation

## 2012-01-21 DIAGNOSIS — Z856 Personal history of leukemia: Secondary | ICD-10-CM | POA: Insufficient documentation

## 2012-01-21 DIAGNOSIS — R109 Unspecified abdominal pain: Secondary | ICD-10-CM

## 2012-01-21 LAB — WET PREP FOR TRICH, YEAST, CLUE
Clue Cells Wet Prep HPF POC: NONE SEEN
Trich, Wet Prep: NONE SEEN
Yeast Wet Prep HPF POC: NONE SEEN

## 2012-01-21 LAB — URINALYSIS W MICROSCOPIC + REFLEX CULTURE
Nitrite: POSITIVE — AB
Protein, ur: 30 mg/dL — AB
Urobilinogen, UA: 0.2 mg/dL (ref 0.0–1.0)
pH: 6 (ref 5.0–8.0)

## 2012-01-21 MED ORDER — NYSTATIN 100000 UNIT/GM EX CREA: TOPICAL_CREAM | Freq: Two times a day (BID) | CUTANEOUS | Status: DC

## 2012-01-21 MED ORDER — CIPROFLOXACIN HCL 500 MG PO TABS: 500.0000 mg | ORAL_TABLET | Freq: Two times a day (BID) | ORAL | Status: AC

## 2012-01-21 MED ORDER — FLUCONAZOLE 150 MG PO TABS: 150.0000 mg | ORAL_TABLET | Freq: Once | ORAL | Status: AC

## 2012-01-21 NOTE — Progress Notes (Signed)
Patient ID: Marissa [name], female   DOB: 1966/03/20, 46 y.o.   MRN: [id number] Presents with a two-day history of [name] and burning with urination at initiation and at the end of stream. Complained of some vaginal irritation with increased yellow discharge. Denies any fever.  History of TAH with BSO in 03. Not sexually active at this time. Questions why she is getting more  UTIs. Review of chart, she had one March of 2013 with a negative urine culture following treatment. One prior to that was March of 2012.  Exam: No CVAT, external genitalia erythematous, speculum exam scant discharge, wet prep negative. UA: Large leukocytes, moderate blood, WBCs 11-20 rbc's 7-10.  UTI  Plan: Cipro 500 by mouth twice a day for 7 days, Diflucan 150 by mouth x1 dose if become symptomatic after Cipro. Will check a test of cure urine in 2 weeks. UTI prevention discussed. Urine culture pending. Nystatin cream to external genitalia.

## 2012-01-21 NOTE — Patient Instructions (Addendum)
Vit E twice dailyUrinary Tract Infection Infections of the urinary tract can start in several places. A bladder infection (cystitis), a kidney infection (pyelonephritis), and a prostate infection (prostatitis) are different types of urinary tract infections (UTIs). They usually get better if treated with medicines (antibiotics) that kill germs. Take all the medicine until it is gone. You or your child may feel better in a few days, but TAKE ALL MEDICINE or the infection may not respond and may become more difficult to treat. HOME CARE INSTRUCTIONS   Drink enough water and fluids to keep the urine clear or pale yellow. Cranberry juice is especially recommended, in addition to large amounts of water.   Avoid caffeine, tea, and carbonated beverages. They tend to irritate the bladder.   Alcohol may irritate the prostate.   Only take over-the-counter or prescription medicines for pain, discomfort, or fever as directed by your caregiver.  To prevent further infections:  Empty the bladder often. Avoid holding urine for long periods of time.   After a bowel movement, women should cleanse from front to back. Use each tissue only once.   Empty the bladder before and after sexual intercourse.  FINDING OUT THE RESULTS OF YOUR TEST Not all test results are available during your visit. If your or your child's test results are not back during the visit, make an appointment with your caregiver to find out the results. Do not assume everything is normal if you have not heard from your caregiver or the medical facility. It is important for you to follow up on all test results. SEEK MEDICAL CARE IF:   There is back pain.   Your baby is older than 3 months with a rectal temperature of 100.5 F (38.1 C) or higher for more than 1 day.   Your or your child's problems (symptoms) are no better in 3 days. Return sooner if you or your child is getting worse.  SEEK IMMEDIATE MEDICAL CARE IF:   There is severe back  pain or lower abdominal pain.   You or your child develops chills.   You have a fever.   Your baby is older than 3 months with a rectal temperature of 102 F (38.9 C) or higher.   Your baby is 100 months old or younger with a rectal temperature of 100.4 F (38 C) or higher.   There is nausea or vomiting.   There is continued burning or discomfort with urination.  MAKE SURE YOU:   Understand these instructions.   Will watch your condition.   Will get help right away if you are not doing well or get worse.  Document Released: 06/20/2005 Document Revised: 08/30/2011 Document Reviewed: 01/23/2007 [facility] Patient Information 2012 [place], [place].

## 2012-01-23 ENCOUNTER — Other Ambulatory Visit: Payer: Self-pay | Admitting: *Deleted

## 2012-01-23 DIAGNOSIS — R8290 Unspecified abnormal findings in urine: Secondary | ICD-10-CM

## 2012-01-23 DIAGNOSIS — R7989 Other specified abnormal findings of blood chemistry: Secondary | ICD-10-CM

## 2012-01-23 LAB — URINE CULTURE: Colony Count: >=100000

## 2012-01-23 NOTE — Progress Notes (Signed)
Addended byValeda Malm L on: 01/23/2012 03:23 PM   Modules accepted: Orders

## 2012-01-25 ENCOUNTER — Other Ambulatory Visit: Payer: BC Managed Care – PPO

## 2012-01-30 ENCOUNTER — Ambulatory Visit: Payer: BC Managed Care – PPO

## 2012-01-30 ENCOUNTER — Ambulatory Visit
Admission: RE | Admit: 2012-01-30 | Discharge: 2012-01-30 | Disposition: A | Discharge status: Home / Self Care | Payer: BC Managed Care – PPO | Source: Ambulatory Visit | Attending: Gynecology | Admitting: Gynecology

## 2012-01-30 DIAGNOSIS — Z1231 Encounter for screening mammogram for malignant neoplasm of breast: Secondary | ICD-10-CM

## 2012-02-01 ENCOUNTER — Encounter: Payer: Self-pay | Admitting: Women's Health

## 2012-02-01 ENCOUNTER — Ambulatory Visit (INDEPENDENT_AMBULATORY_CARE_PROVIDER_SITE_OTHER): Payer: BC Managed Care – PPO | Admitting: Women's Health

## 2012-02-01 VITALS — BP 120/80 | Ht 63.5 in | Wt 280.0 lb

## 2012-02-01 DIAGNOSIS — Z01419 Encounter for gynecological examination (general) (routine) without abnormal findings: Secondary | ICD-10-CM

## 2012-02-01 DIAGNOSIS — Z833 Family history of diabetes mellitus: Secondary | ICD-10-CM

## 2012-02-01 DIAGNOSIS — Z13220 Encounter for screening for lipoid disorders: Secondary | ICD-10-CM

## 2012-02-01 LAB — CBC WITH DIFFERENTIAL/PLATELET
Basophils Absolute: 0 10*3/uL (ref 0.0–0.1)
Basophils Relative: 0 % (ref 0–1)
HCT: 43.6 % (ref 36.0–46.0)
Lymphocytes Relative: 36 % (ref 12–46)
MCHC: 32.8 g/dL (ref 30.0–36.0)
Monocytes Absolute: 0.5 10*3/uL (ref 0.1–1.0)
Neutro Abs: 5.1 10*3/uL (ref 1.7–7.7)
Neutrophils Relative %: 56 % (ref 43–77)
Platelets: 268 10*3/uL (ref 150–400)
RDW: 13.3 % (ref 11.5–15.5)
WBC: 9 10*3/uL (ref 4.0–10.5)

## 2012-02-01 LAB — LIPID PANEL
HDL: 50 mg/dL (ref >39)
LDL Cholesterol: 89 mg/dL (ref 0–99)
Triglycerides: 195 mg/dL — ABNORMAL HIGH (ref <150)

## 2012-02-01 NOTE — Progress Notes (Signed)
Marissa [name] 10-Feb-1966 [id number]    History:    The patient presents for annual exam.  History of a TAH with BSO for endometriosis on no HRT. 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 Normal bone density in 2012. Morbid obesity, only medical problem is asthma. History of normal mammograms and Paps. Had a normal colonoscopy  2012.   Past medical history, past surgical history, family history and social history were all reviewed and documented in the EPIC chart. 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 Currently laid off from a job of greater than 20 years.   ROS:  A  ROS was performed and pertinent positives and negatives are included in the history.  Exam:  Filed Vitals:   02/01/12 0930  BP: 120/80    General appearance:  Normal Head/Neck:  Normal, without cervical or supraclavicular adenopathy. Thyroid:  Symmetrical, normal in size, without palpable masses or nodularity. Respiratory  Effort:  Normal  Auscultation:  Clear without wheezing or rhonchi Cardiovascular  Auscultation:  Regular rate, without rubs, murmurs or gallops  Edema/varicosities:  Not grossly evident Abdominal  Soft,nontender, without masses, guarding or rebound.  Liver/spleen:  No organomegaly noted  Hernia:  None appreciated  Skin  Inspection:  Grossly normal  Palpation:  Grossly normal Neurologic/psychiatric  Orientation:  Normal with appropriate conversation.  Mood/affect:  Normal  Genitourinary    Breasts: Examined lying and sitting.     Right: Without masses, retractions, discharge or axillary adenopathy.     Left: Without masses, retractions, discharge or axillary adenopathy.   Inguinal/mons:  Normal without inguinal adenopathy  External genitalia:  Normal  BUS/Urethra/Skene's glands:  Normal  Bladder:  Normal  Vagina:  Normal  Cervix:    Uterus:  Absent  Adnexa/parametria:     Rt: Without masses or tenderness.   Lt: Without masses or tenderness.  Anus and  perineum: Normal  Digital rectal exam: Normal sphincter tone without palpated masses or tenderness  Assessment/Plan:  46 y.o. SWF G0 for annual exam.     History of a TAH with BSO for endometriosis on no ERT Morbid obesity Normal DEXA - bilateral hip average T score 0  02/2011  Plan: CBC, hemoglobin A1c, lipid panel, UA. SBE's, continue annual mammogram, calcium rich diet, vitamin D 2000 daily encouraged. Reviewed importance of cutting calories and increasing exercise for weight loss and health.   Harrington Challenger WHNP, 1:06 PM 02/01/2012

## 2012-02-01 NOTE — Patient Instructions (Signed)

## 2012-02-02 LAB — URINALYSIS W MICROSCOPIC + REFLEX CULTURE
Casts: NONE SEEN
Crystals: NONE SEEN
Glucose, UA: NEGATIVE mg/dL
Ketones, ur: NEGATIVE mg/dL
Leukocytes, UA: NEGATIVE
Nitrite: NEGATIVE
Specific Gravity, Urine: 1.018 (ref 1.005–1.030)
pH: 5.5 (ref 5.0–8.0)

## 2012-05-30 ENCOUNTER — Encounter (INDEPENDENT_AMBULATORY_CARE_PROVIDER_SITE_OTHER): Payer: Self-pay

## 2012-05-30 ENCOUNTER — Telehealth (INDEPENDENT_AMBULATORY_CARE_PROVIDER_SITE_OTHER): Payer: Self-pay

## 2012-05-30 NOTE — Telephone Encounter (Signed)
Recall letter mailed to pt . Due for thyroid ultrasound and tsh. Orders in epic.

## 2012-06-11 ENCOUNTER — Ambulatory Visit (INDEPENDENT_AMBULATORY_CARE_PROVIDER_SITE_OTHER): Payer: BC Managed Care – PPO | Admitting: Women's Health

## 2012-06-11 ENCOUNTER — Encounter: Payer: Self-pay | Admitting: Women's Health

## 2012-06-11 DIAGNOSIS — Z23 Encounter for immunization: Secondary | ICD-10-CM

## 2012-06-11 DIAGNOSIS — N899 Noninflammatory disorder of vagina, unspecified: Secondary | ICD-10-CM

## 2012-06-11 DIAGNOSIS — N898 Other specified noninflammatory disorders of vagina: Secondary | ICD-10-CM

## 2012-06-11 LAB — WET PREP FOR TRICH, YEAST, CLUE
Clue Cells Wet Prep HPF POC: NONE SEEN
Trich, Wet Prep: NONE SEEN

## 2012-06-11 LAB — URINALYSIS W MICROSCOPIC + REFLEX CULTURE
Leukocytes, UA: NEGATIVE
Nitrite: NEGATIVE
Protein, ur: NEGATIVE mg/dL
Specific Gravity, Urine: 1.025 (ref 1.005–1.030)
Urobilinogen, UA: 0.2 mg/dL (ref 0.0–1.0)

## 2012-06-11 MED ORDER — FLUCONAZOLE 100 MG PO TABS: ORAL_TABLET | ORAL | Status: DC

## 2012-06-11 MED ORDER — NYSTATIN-TRIAMCINOLONE 100000-0.1 UNIT/GM-% EX OINT: TOPICAL_OINTMENT | Freq: Two times a day (BID) | CUTANEOUS | Status: DC

## 2012-06-11 NOTE — Patient Instructions (Signed)
Monilial Vaginitis Vaginitis in a soreness, swelling and redness (inflammation) of the vagina and vulva. Monilial vaginitis is not a sexually transmitted infection. CAUSES  Yeast vaginitis is caused by yeast (candida) that is normally found in your vagina. With a yeast infection, the candida has overgrown in number to a point that upsets the chemical balance. SYMPTOMS   White, thick vaginal discharge.   Swelling, itching, redness and irritation of the vagina and possibly the lips of the vagina (vulva).   Burning or painful urination.   Painful intercourse.  DIAGNOSIS  Things that may contribute to monilial vaginitis are:  Postmenopausal and virginal states.   Pregnancy.   Infections.   Being tired, sick or stressed, especially if you had monilial vaginitis in the past.   Diabetes. Good control will help lower the chance.   Birth control pills.   Tight fitting garments.   Using bubble bath, feminine sprays, douches or deodorant tampons.   Taking certain medications that kill germs (antibiotics).   Sporadic recurrence can occur if you become ill.  TREATMENT  Your caregiver will give you medication.  There are several kinds of anti monilial vaginal creams and suppositories specific for monilial vaginitis. For recurrent yeast infections, use a suppository or cream in the vagina 2 times a week, or as directed.   Anti-monilial or steroid cream for the itching or irritation of the vulva may also be used. Get your caregiver's permission.   Painting the vagina with methylene blue solution may help if the monilial cream does not work.   Eating yogurt may help prevent monilial vaginitis.  HOME CARE INSTRUCTIONS   Finish all medication as prescribed.   Do not have sex until treatment is completed or after your caregiver tells you it is okay.   Take warm sitz baths.   Do not douche.   Do not use tampons, especially scented ones.   Wear cotton underwear.   Avoid tight  pants and panty hose.   Tell your sexual partner that you have a yeast infection. They should go to their caregiver if they have symptoms such as mild rash or itching.   Your sexual partner should be treated as well if your infection is difficult to eliminate.   Practice safer sex. Use condoms.   Some vaginal medications cause latex condoms to fail. Vaginal medications that harm condoms are:   Cleocin cream.   Butoconazole (Femstat).   Terconazole (Terazol) vaginal suppository.   Miconazole (Monistat) (may be purchased over the counter).  SEEK MEDICAL CARE IF:   You have a temperature by mouth above 102 F (38.9 C).   The infection is getting worse after 2 days of treatment.   The infection is not getting better after 3 days of treatment.   You develop blisters in or around your vagina.   You develop vaginal bleeding, and it is not your menstrual period.   You have pain when you urinate.   You develop intestinal problems.   You have pain with sexual intercourse.  Document Released: 06/20/2005 Document Revised: 08/30/2011 Document Reviewed: 03/04/2009 [employer] Patient Information 2012 [employer]. 

## 2012-06-11 NOTE — Progress Notes (Signed)
Patient ID: Marissa [name], female   DOB: December 17, 1965, 46 y.o.   MRN: [id number] Presents with the complaint of vaginal irritation with itching and skin irritation at groin. Denies urinary symptoms. History of TAH with BSO 03 for endometriosis.  Exam: Morbidly obese, erythematous at pannus, groin, vaginal introitus. Speculum exam vaginal walls slightly erythematous but dry, wet prep negative. UA negative leukocytes with few bacteria.  Probable skin monilial infection  Plan: Encouraged to try to keep area clean and dry, cotton clothing, Mycolog twice daily to affected areas, instructed to call if no relief of symptoms. It is aware of need for weight loss. Urine culture pending the

## 2012-06-13 LAB — URINE CULTURE: Colony Count: 25000

## 2012-07-07 ENCOUNTER — Ambulatory Visit (INDEPENDENT_AMBULATORY_CARE_PROVIDER_SITE_OTHER): Payer: BC Managed Care – PPO | Admitting: Surgery

## 2012-07-07 ENCOUNTER — Encounter (INDEPENDENT_AMBULATORY_CARE_PROVIDER_SITE_OTHER): Payer: Self-pay | Admitting: Surgery

## 2012-07-07 VITALS — BP 130/86 | HR 91 | Temp 97.7°F | Resp 18 | Ht 63.0 in | Wt 290.8 lb

## 2012-07-07 DIAGNOSIS — K602 Anal fissure, unspecified: Secondary | ICD-10-CM

## 2012-07-07 DIAGNOSIS — E042 Nontoxic multinodular goiter: Secondary | ICD-10-CM

## 2012-07-07 NOTE — Patient Instructions (Signed)
ANORECTAL PROCEDURES: 1.  Tub soaks 2-3 times daily in warm water (may add Epsom salts if desired) 2.  Stool softener for one month (store brand Miralax or Colace) 3.  Avoid toilet paper - use baby wipes or Tucks pads 4.  Increase water intake - 6-8 glasses daily 5.  Apply dry pad to area until drainage stops 

## 2012-07-07 NOTE — Progress Notes (Signed)
General Surgery [facility]  Visit Diagnoses: 1. Multinodular goiter (nontoxic), dominant nodule left lobe   2. Anal fissure     HISTORY: Patient is a 46 year old white female followed in our practice for dominant left thyroid nodule. It has been a little over one year since her last office visit. She does not take thyroid hormone suppression. She did have a TSH level checked in May 2013 at the time of her gynecologic assessment.  Patient has a long-standing problem with hemorrhoids. She has had anal fissures in the past. She had a colonoscopy last year. She has had no prior anorectal surgery.  Over the past several weeks she has noted bleeding with bowel movements. She is having pain. She presents today for assessment.  PERTINENT REVIEW OF SYSTEMS: Bleeding with bowel movements, pain with bowel movements. No current laxative use. Denies tremors. Denies palpitations. Denies new neck masses. Denies dysphagia.  EXAM: HEENT: normocephalic; pupils equal and reactive; sclerae clear; dentition good; mucous membranes moist NECK:  Palpation reveals dominant nodule in inferior left thyroid lobe, approximately 3 cm, mobile with swallowing, nontender; right thyroid lobe is without palpable abnormality; symmetric on extension; no palpable anterior or posterior cervical lymphadenopathy; no supraclavicular masses; no tenderness CHEST: clear to auscultation bilaterally without rales, rhonchi, or wheezes CARDIAC: regular rate and rhythm without significant murmur; peripheral pulses are full GU:  External examination shows multiple anal skin tags. There is a prominent tag in the anterior midline. There are some superficial fissures in the right posterior position externally. Digital rectal exam is performed. There are no palpable masses. Anoscopy is performed and a good 360 view of the lower rectal mucosa is obtained. Grade 1 internal hemorrhoids are noted. There is no evidence of recent  bleeding. On removal of the scope however there is a acute fissure in the anterior midline which appears friable and moderately deep. EXT:  non-tender without edema; no deformity NEURO: no gross focal deficits; no sign of tremor   IMPRESSION: #1 acute anal fissure, symptomatic #2 grade 1-2 internal hemorrhoids, quiescent #3 dominant left thyroid nodule, clinically stable  PLAN: Patient will undergo thyroid ultrasound in the near future. I will review the study and contact her with the results. We will obtain a copy of her TSH level from her gynecologist.  Patient has an acute anal fissure. I have started her on treatment with topical diltiazem cream. I've asked her to begin a stool softener and use baby wipes. I've asked her to begin tub soaks. She will return in 4 weeks for evaluation. If there is not significant improvement, she may require surgical management.  Velora Heckler, MD, [facility] Office: [phone]

## 2012-07-10 ENCOUNTER — Ambulatory Visit
Admission: RE | Admit: 2012-07-10 | Discharge: 2012-07-10 | Disposition: A | Discharge status: Home / Self Care | Payer: BC Managed Care – PPO | Source: Ambulatory Visit | Attending: Surgery | Admitting: Surgery

## 2012-07-10 ENCOUNTER — Other Ambulatory Visit (INDEPENDENT_AMBULATORY_CARE_PROVIDER_SITE_OTHER): Payer: Self-pay | Admitting: Surgery

## 2012-07-10 DIAGNOSIS — E042 Nontoxic multinodular goiter: Secondary | ICD-10-CM

## 2012-08-18 ENCOUNTER — Encounter (INDEPENDENT_AMBULATORY_CARE_PROVIDER_SITE_OTHER): Payer: Self-pay | Admitting: Surgery

## 2012-08-18 ENCOUNTER — Ambulatory Visit (INDEPENDENT_AMBULATORY_CARE_PROVIDER_SITE_OTHER): Payer: BC Managed Care – PPO | Admitting: Surgery

## 2012-08-18 DIAGNOSIS — K602 Anal fissure, unspecified: Secondary | ICD-10-CM

## 2012-08-18 DIAGNOSIS — E042 Nontoxic multinodular goiter: Secondary | ICD-10-CM

## 2012-08-18 NOTE — Progress Notes (Signed)
General Surgery [facility]  Visit Diagnoses: 1. Multinodular goiter (nontoxic), dominant nodule left lobe   2. Anal fissure     HISTORY: The patient returns for followup. As for her thyroid, she did undergo thyroid ultrasound. This demonstrated slight interval enlargement of the left lower lobe nodule from 2.9 cm to 3.5 cm over the interval since March of 2011.  2 additional subcentimeter nodules have also developed in the left lobe. There were no findings suspicious for malignancy. TSH level remains normal at 2.011 without thyroid hormone supplementation.  In regards to her anal fissure, the patient has noted symptomatic improvement. She is having minimal pain with bowel movements. She is only having a small amount of intermittent bleeding associated with bowel movements. Overall she feels she is markedly improved.  EXAM: External examination of the anus shows benign anal skin tags. Eversion shows epithelialization of the fissure. There is minimal granulation tissue still present. There is no sign of fistula. There is minimal tenderness.  IMPRESSION: #1 multinodular thyroid goiter, clinically stable #2 acute anal fissure, resolving with medical management  PLAN: Patient will continue topical creams to the anus for one additional month. She will remain on stool softeners for the next 6 weeks. If her symptoms continue to resolve, she does not have to return for followup regarding the anal fissure. Certainly if she develops worsening symptoms she should notify our office immediately.  Patient will return in one year for follow-up of her thyroid nodules. We will repeat a thyroid ultrasound and a TSH level prior to that office visit.  Velora Heckler, MD, FACS General & Endocrine Surgery [facility]

## 2012-08-18 NOTE — Patient Instructions (Signed)
ANORECTAL PROCEDURES: 1.  Tub soaks 2-3 times daily in warm water (may add Epsom salts if desired) 2.  Stool softener for one month (store brand Miralax or Colace) 3.  Avoid toilet paper - use baby wipes or Tucks pads 4.  Increase water intake - 6-8 glasses daily 5.  Apply dry pad to area until drainage stops 

## 2012-10-22 ENCOUNTER — Other Ambulatory Visit: Payer: Self-pay

## 2012-10-25 DIAGNOSIS — C4490 Unspecified malignant neoplasm of skin, unspecified: Secondary | ICD-10-CM

## 2012-10-25 HISTORY — PX: OTHER SURGICAL HISTORY: SHX169

## 2012-10-25 HISTORY — DX: Unspecified malignant neoplasm of skin, unspecified: C44.90

## 2012-11-04 ENCOUNTER — Encounter (INDEPENDENT_AMBULATORY_CARE_PROVIDER_SITE_OTHER): Payer: Self-pay

## 2012-11-11 ENCOUNTER — Other Ambulatory Visit: Payer: Self-pay | Admitting: Dermatology

## 2012-12-30 ENCOUNTER — Other Ambulatory Visit: Payer: Self-pay

## 2012-12-30 DIAGNOSIS — Z1231 Encounter for screening mammogram for malignant neoplasm of breast: Secondary | ICD-10-CM

## 2013-01-01 ENCOUNTER — Encounter: Payer: Self-pay | Admitting: Women's Health

## 2013-01-01 ENCOUNTER — Ambulatory Visit (INDEPENDENT_AMBULATORY_CARE_PROVIDER_SITE_OTHER): Payer: BC Managed Care – PPO | Admitting: Women's Health

## 2013-01-01 DIAGNOSIS — N390 Urinary tract infection, site not specified: Secondary | ICD-10-CM

## 2013-01-01 DIAGNOSIS — N760 Acute vaginitis: Secondary | ICD-10-CM

## 2013-01-01 DIAGNOSIS — A499 Bacterial infection, unspecified: Secondary | ICD-10-CM

## 2013-01-01 LAB — URINALYSIS W MICROSCOPIC + REFLEX CULTURE
Casts: NONE SEEN
Glucose, UA: 500 mg/dL — AB
Ketones, ur: NEGATIVE mg/dL
Leukocytes, UA: NEGATIVE
pH: 7 (ref 5.0–8.0)

## 2013-01-01 MED ORDER — METRONIDAZOLE 500 MG PO TABS: 500.0000 mg | ORAL_TABLET | Freq: Two times a day (BID) | ORAL | Status: DC

## 2013-01-01 NOTE — Progress Notes (Signed)
Patient ID: Marissa [name], female   DOB: 1966/05/04, 47 y.o.   MRN: [id number] Presents with questionable blood in urine, slight [name] burning with urination, has used AZO. Also had some vaginal discharge with minimal odor. Denies abdominal [name], fever. TAH with BSO.   Exam: Morbidly obese, external genitalia erythematous, no visible ulcerated areas, moderate irritation. Wet prep with a Q-tip, wet prep positive for amines, clue cells, moderate bacteria. UA 0 - 2 WBCs,  21-50 RBC's, moderate blood, moderate bacteria.  Bacteria vaginosis  Plan: Flagyl 500 twice daily twice a day for 7 days #14, alcohol precautions reviewed. Urine culture pending, encouraged to increase by mouth fluids.

## 2013-01-03 LAB — URINE CULTURE: Colony Count: 75000

## 2013-01-07 ENCOUNTER — Other Ambulatory Visit: Payer: Self-pay | Admitting: Women's Health

## 2013-01-07 DIAGNOSIS — R3129 Other microscopic hematuria: Secondary | ICD-10-CM

## 2013-01-07 DIAGNOSIS — R81 Glycosuria: Secondary | ICD-10-CM

## 2013-01-07 LAB — WET PREP FOR TRICH, YEAST, CLUE: Yeast Wet Prep HPF POC: NONE SEEN

## 2013-01-08 ENCOUNTER — Other Ambulatory Visit: Payer: BC Managed Care – PPO

## 2013-01-08 DIAGNOSIS — R81 Glycosuria: Secondary | ICD-10-CM

## 2013-01-08 DIAGNOSIS — R3129 Other microscopic hematuria: Secondary | ICD-10-CM

## 2013-01-08 LAB — URINALYSIS W MICROSCOPIC + REFLEX CULTURE
Hgb urine dipstick: NEGATIVE
Nitrite: NEGATIVE
pH: 5.5 (ref 5.0–8.0)

## 2013-01-10 LAB — URINE CULTURE: Colony Count: 40000

## 2013-01-30 ENCOUNTER — Ambulatory Visit
Admission: RE | Admit: 2013-01-30 | Discharge: 2013-01-30 | Disposition: A | Discharge status: Home / Self Care | Payer: BC Managed Care – PPO | Source: Ambulatory Visit

## 2013-01-30 DIAGNOSIS — Z1231 Encounter for screening mammogram for malignant neoplasm of breast: Secondary | ICD-10-CM

## 2013-02-02 ENCOUNTER — Other Ambulatory Visit (HOSPITAL_COMMUNITY)
Admission: RE | Admit: 2013-02-02 | Discharge: 2013-02-02 | Disposition: A | Discharge status: Home / Self Care | Payer: BC Managed Care – PPO | Source: Ambulatory Visit | Attending: Obstetrics and Gynecology | Admitting: Obstetrics and Gynecology

## 2013-02-02 ENCOUNTER — Ambulatory Visit (INDEPENDENT_AMBULATORY_CARE_PROVIDER_SITE_OTHER): Payer: BC Managed Care – PPO | Admitting: Women's Health

## 2013-02-02 ENCOUNTER — Encounter: Payer: Self-pay | Admitting: Women's Health

## 2013-02-02 VITALS — BP 118/74 | Ht 63.5 in | Wt 283.0 lb

## 2013-02-02 DIAGNOSIS — Z01419 Encounter for gynecological examination (general) (routine) without abnormal findings: Secondary | ICD-10-CM | POA: Insufficient documentation

## 2013-02-02 DIAGNOSIS — Z833 Family history of diabetes mellitus: Secondary | ICD-10-CM

## 2013-02-02 DIAGNOSIS — N898 Other specified noninflammatory disorders of vagina: Secondary | ICD-10-CM

## 2013-02-02 DIAGNOSIS — Z780 Asymptomatic menopausal state: Secondary | ICD-10-CM

## 2013-02-02 DIAGNOSIS — Z1272 Encounter for screening for malignant neoplasm of vagina: Secondary | ICD-10-CM

## 2013-02-02 DIAGNOSIS — Z13220 Encounter for screening for lipoid disorders: Secondary | ICD-10-CM

## 2013-02-02 DIAGNOSIS — N899 Noninflammatory disorder of vagina, unspecified: Secondary | ICD-10-CM

## 2013-02-02 LAB — LIPID PANEL
HDL: 64 mg/dL (ref >39)
LDL Cholesterol: 100 mg/dL — ABNORMAL HIGH (ref 0–99)

## 2013-02-02 LAB — CBC WITH DIFFERENTIAL/PLATELET
Basophils Absolute: 0 10*3/uL (ref 0.0–0.1)
Basophils Relative: 0 % (ref 0–1)
Eosinophils Absolute: 0.1 10*3/uL (ref 0.0–0.7)
Eosinophils Relative: 2 % (ref 0–5)
Lymphs Abs: 3 10*3/uL (ref 0.7–4.0)
MCH: 30.9 pg (ref 26.0–34.0)
Neutrophils Relative %: 56 % (ref 43–77)
Platelets: 264 10*3/uL (ref 150–400)
RBC: 4.85 MIL/uL (ref 3.87–5.11)
RDW: 13.6 % (ref 11.5–15.5)
WBC: 8.2 10*3/uL (ref 4.0–10.5)

## 2013-02-02 LAB — GLUCOSE, RANDOM: Glucose, Bld: 125 mg/dL — ABNORMAL HIGH (ref 70–99)

## 2013-02-02 MED ORDER — FLUCONAZOLE 100 MG PO TABS: ORAL_TABLET | ORAL | Status: DC

## 2013-02-02 MED ORDER — NYSTATIN-TRIAMCINOLONE 100000-0.1 UNIT/GM-% EX OINT: TOPICAL_OINTMENT | Freq: Two times a day (BID) | CUTANEOUS | Status: DC

## 2013-02-02 NOTE — Patient Instructions (Signed)

## 2013-02-02 NOTE — Progress Notes (Signed)
Marissa [name] 1966/06/18 [id number]    History:    The patient presents for annual exam.  TAH with BSO in 2003 for pelvic pain and endometriosis. A LL in 79 has been in remission since. History of thyroid nodules being followed by ultrasound by endocrinologist/benign biopsy. IBS. Normal DEXA 2012 T score -1 at right femoral neck but has had a decrease in both right and left hip. On no HRT. Normal Pap and mammogram history.  Past medical history, past surgical history, family history and social history were all reviewed and documented in the EPIC chart. Was laid off from a job she had 20 years. 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 Mother died of lung cancer, diabetes also. Father diabetic, stroke, heart disease.   ROS:  A  ROS was performed and pertinent positives and negatives are included in the history.  Exam:  Filed Vitals:   02/02/13 0932  BP: 118/74    General appearance:  Normal Head/Neck:  Normal, without cervical or supraclavicular adenopathy. Thyroid:  Symmetrical, normal in size, without palpable masses or nodularity. Respiratory  Effort:  Normal  Auscultation:  Clear without wheezing or rhonchi Cardiovascular  Auscultation:  Regular rate, without rubs, murmurs or gallops  Edema/varicosities:  Not grossly evident Abdominal  Soft,nontender, without masses, guarding or rebound.  Liver/spleen:  No organomegaly noted  Hernia:  None appreciated  Skin  Inspection:  Grossly normal  Palpation:  Grossly normal Neurologic/psychiatric  Orientation:  Normal with appropriate conversation.  Mood/affect:  Normal  Genitourinary    Breasts: Examined lying and sitting.     Right: Without masses, retractions, discharge or axillary adenopathy.     Left: Without masses, retractions, discharge or axillary adenopathy.   Inguinal/mons:  Normal without inguinal adenopathy  External genitalia:  Normal  BUS/Urethra/Skene's glands:  Normal  Bladder:  Normal  Vagina:   Normal  Cervix:  absent  Uterus: absent  Adnexa/parametria:     Rt: Without masses or tenderness.   Lt: Without masses or tenderness.  Anus and perineum: Normal  Digital rectal exam: Normal sphincter tone without palpated masses or tenderness  Assessment/Plan:  47 y.o. SWF G0 for annual exam With no complaints.  TAH with BSO 2003  ALL 1979 remission Benign thyroid nodule IBS Normal DEXA 2012 obesity  Plan: CBC, glucose, lipid panel, TSH, UA. SBE's, continue annual mammogram, repeat DEXA. Reviewed importance of weight loss and exercise for health. Condoms encouraged if become sexually active.   Harrington Challenger WHNP, 2:16 PM 02/02/2013

## 2013-02-04 ENCOUNTER — Other Ambulatory Visit: Payer: Self-pay | Admitting: *Deleted

## 2013-02-04 DIAGNOSIS — Z8639 Personal history of other endocrine, nutritional and metabolic disease: Secondary | ICD-10-CM

## 2013-07-07 ENCOUNTER — Ambulatory Visit (INDEPENDENT_AMBULATORY_CARE_PROVIDER_SITE_OTHER): Payer: BC Managed Care – PPO | Admitting: Women's Health

## 2013-07-07 ENCOUNTER — Encounter: Payer: Self-pay | Admitting: Women's Health

## 2013-07-07 DIAGNOSIS — R350 Frequency of micturition: Secondary | ICD-10-CM

## 2013-07-07 DIAGNOSIS — N390 Urinary tract infection, site not specified: Secondary | ICD-10-CM

## 2013-07-07 LAB — URINALYSIS W MICROSCOPIC + REFLEX CULTURE
Casts: NONE SEEN
Crystals: NONE SEEN
Glucose, UA: NEGATIVE mg/dL
Nitrite: POSITIVE — AB
Specific Gravity, Urine: >1.03 — ABNORMAL HIGH (ref 1.005–1.030)
pH: 5.5 (ref 5.0–8.0)

## 2013-07-07 MED ORDER — CIPROFLOXACIN HCL 250 MG PO TABS: 250.0000 mg | ORAL_TABLET | Freq: Two times a day (BID) | ORAL | Status: DC

## 2013-07-07 NOTE — Patient Instructions (Signed)
Urinary Tract Infection  Urinary tract infections (UTIs) can develop anywhere along your urinary tract. Your urinary tract is your body's drainage system for removing wastes and extra water. Your urinary tract includes two kidneys, two ureters, a bladder, and a urethra. Your kidneys are a pair of bean-shaped organs. Each kidney is about the size of your fist. They are located below your ribs, one on each side of your spine.  CAUSES  Infections are caused by microbes, which are microscopic organisms, including fungi, viruses, and bacteria. These organisms are so small that they can only be seen through a microscope. Bacteria are the microbes that most commonly cause UTIs.  SYMPTOMS   Symptoms of UTIs may vary by age and gender of the patient and by the location of the infection. Symptoms in Marissa [name] women typically include a frequent and intense urge to urinate and a painful, burning feeling in the bladder or urethra during urination. Older women and men are more likely to be tired, shaky, and weak and have muscle aches and abdominal pain. A fever may mean the infection is in your kidneys. Other symptoms of a kidney infection include pain in your back or sides below the ribs, nausea, and vomiting.  DIAGNOSIS  To diagnose a UTI, your caregiver will ask you about your symptoms. Your caregiver also will ask to provide a urine sample. The urine sample will be tested for bacteria and white blood cells. White blood cells are made by your body to help fight infection.  TREATMENT   Typically, UTIs can be treated with medication. Because most UTIs are caused by a bacterial infection, they usually can be treated with the use of antibiotics. The choice of antibiotic and length of treatment depend on your symptoms and the type of bacteria causing your infection.  HOME CARE INSTRUCTIONS   If you were prescribed antibiotics, take them exactly as your caregiver instructs you. Finish the medication even if you feel better after you  have only taken some of the medication.   Drink enough water and fluids to keep your urine clear or pale yellow.   Avoid caffeine, tea, and carbonated beverages. They tend to irritate your bladder.   Empty your bladder often. Avoid holding urine for long periods of time.   Empty your bladder before and after sexual intercourse.   After a bowel movement, women should cleanse from front to back. Use each tissue only once.  SEEK MEDICAL CARE IF:    You have back pain.   You develop a fever.   Your symptoms do not begin to resolve within 3 days.  SEEK IMMEDIATE MEDICAL CARE IF:    You have severe back pain or lower abdominal pain.   You develop chills.   You have nausea or vomiting.   You have continued burning or discomfort with urination.  MAKE SURE YOU:    Understand these instructions.   Will watch your condition.   Will get help right away if you are not doing well or get worse.  Document Released: 06/20/2005 Document Revised: 03/11/2012 Document Reviewed: 10/19/2011  ExitCare Patient Information 2014 [employer].

## 2013-07-07 NOTE — Progress Notes (Signed)
Patient ID: Marissa [name], female   DOB: 1965-10-22, 46 y.o.   MRN: [id number] Presents with UTI-like symptoms with urgency, frequency, [name] with urination, lower abdominal/ back [name] x 3 days.  Waking up 2-3 times per night, urine has had a strong odor/ darker color.  Took AZO OTC without relief.  Not sexually active x 1 year.  Denies vaginal discharge, odor, irritation or fever. Second UTI  this year. TAH BSO on no HRT.  Exam:  Patient appears well, obese.  No CVA tenderness.  UA: orange, cloudy with positive nitrites, moderate leukocytes, TNTC WBC/ RBCs, many bacteria.  Urinary Tract Infection  Plan:  Ciprofloxacin 250 mg po x 5 days, take only for 3 days if better.  Diflucan 150 mg po once if needed. Administered Flu vaccine.  Encouraged cranberry juice for UTI prevention.  Urine culture pending. Instructed to call if if symptoms do not resolve.

## 2013-07-10 LAB — URINE CULTURE: Colony Count: >=100000

## 2013-07-13 ENCOUNTER — Ambulatory Visit (INDEPENDENT_AMBULATORY_CARE_PROVIDER_SITE_OTHER): Payer: BC Managed Care – PPO | Admitting: Gynecology

## 2013-07-13 ENCOUNTER — Encounter: Payer: Self-pay | Admitting: Gynecology

## 2013-07-13 DIAGNOSIS — R300 Dysuria: Secondary | ICD-10-CM

## 2013-07-13 DIAGNOSIS — N390 Urinary tract infection, site not specified: Secondary | ICD-10-CM

## 2013-07-13 LAB — URINALYSIS W MICROSCOPIC + REFLEX CULTURE
Bilirubin Urine: NEGATIVE
Casts: NONE SEEN
Crystals: NONE SEEN
Ketones, ur: NEGATIVE mg/dL
Specific Gravity, Urine: 1.025 (ref 1.005–1.030)
Urobilinogen, UA: 0.2 mg/dL (ref 0.0–1.0)
pH: 5.5 (ref 5.0–8.0)

## 2013-07-13 MED ORDER — NITROFURANTOIN MONOHYD MACRO 100 MG PO CAPS: 100.0000 mg | ORAL_CAPSULE | Freq: Two times a day (BID) | ORAL | Status: DC

## 2013-07-13 NOTE — Progress Notes (Signed)
Patient presents complaining of dysuria. Was recently seen by Harriett Sine and treated for UTI with ciprofloxacin. Her urine did grow out Escherichia coli that was resistant to ciprofloxacin. Her symptoms seemed to transiently get better but now she has stopped her ciprofloxacin her symptoms have returned with dysuria. No back pain fever chills nausea vomiting or other constitutional symptoms.  Exam Spine straight without CVA tenderness Abdomen soft without gross masses or tenderness.  Urinalysis shows positive nitrites with 0-2 RBC 0-2 PC rare bacteria and many epithelial cells. Urine culture pending.  Assessment and plan: Return of urinary symptoms following treatment with antibiotics at the bacteria is resistant to. It is sensitive to Macrobid. Patient is allergic to sulfa. Will treat with Macrobid 100 mg twice a day x7 days. Followup if symptoms persist worsen or recur.

## 2013-07-13 NOTE — Patient Instructions (Signed)
Take Macrobid antibiotic twice daily for 7 days. Followup if symptoms persist, worsen or recur.

## 2013-07-14 LAB — URINE CULTURE

## 2013-08-05 ENCOUNTER — Telehealth (INDEPENDENT_AMBULATORY_CARE_PROVIDER_SITE_OTHER): Payer: Self-pay | Admitting: General Surgery

## 2013-08-05 ENCOUNTER — Other Ambulatory Visit (INDEPENDENT_AMBULATORY_CARE_PROVIDER_SITE_OTHER): Payer: Self-pay

## 2013-08-05 ENCOUNTER — Telehealth (INDEPENDENT_AMBULATORY_CARE_PROVIDER_SITE_OTHER): Payer: Self-pay | Admitting: *Deleted

## 2013-08-05 DIAGNOSIS — E041 Nontoxic single thyroid nodule: Secondary | ICD-10-CM

## 2013-08-05 NOTE — Telephone Encounter (Signed)
Received a call from the [facility] that patient just walked in over there stating she is suppose to have a soft tissue neck XR.  There is documentation of this study but it is from 2013.  Dr. Ardine Eng previous note states: "Patient will return in one year for follow-up of her thyroid nodules. We will repeat a thyroid ultrasound and a TSH level prior to that office visit." however there is not a current order in the system.  I explained that I would send a message to Dr. Gerrit Friends to review then someone would give the patient a call to update her.  She will let the patient know at this time.

## 2013-08-05 NOTE — Telephone Encounter (Signed)
Patient called and tried to make follow up Ultrasound at [facility]. They state we need to make appt. [place] Order printed and given to referral coordinator to schedule.

## 2013-08-05 NOTE — Telephone Encounter (Signed)
I spoke with pt to inform her of her appt for the [place] head/neck at [facility] on 11/13 with an arrival time of 1:00pm.  Pt is agreeable to this appt.

## 2013-08-05 NOTE — Telephone Encounter (Signed)
Marissa [name] has the orders and labs slip. She is working on contacting pt re: testing and will call pt.

## 2013-08-06 ENCOUNTER — Ambulatory Visit
Admission: RE | Admit: 2013-08-06 | Discharge: 2013-08-06 | Disposition: A | Discharge status: Home / Self Care | Payer: BC Managed Care – PPO | Source: Ambulatory Visit | Attending: Surgery | Admitting: Surgery

## 2013-08-06 DIAGNOSIS — E042 Nontoxic multinodular goiter: Secondary | ICD-10-CM

## 2013-08-18 ENCOUNTER — Ambulatory Visit (INDEPENDENT_AMBULATORY_CARE_PROVIDER_SITE_OTHER): Payer: BC Managed Care – PPO | Admitting: Surgery

## 2013-08-18 ENCOUNTER — Telehealth (INDEPENDENT_AMBULATORY_CARE_PROVIDER_SITE_OTHER): Payer: Self-pay | Admitting: Surgery

## 2013-08-18 ENCOUNTER — Encounter (INDEPENDENT_AMBULATORY_CARE_PROVIDER_SITE_OTHER): Payer: Self-pay

## 2013-08-18 ENCOUNTER — Encounter (INDEPENDENT_AMBULATORY_CARE_PROVIDER_SITE_OTHER): Payer: Self-pay | Admitting: Surgery

## 2013-08-18 VITALS — BP 130/74 | HR 68 | Temp 97.0°F | Resp 18 | Ht 62.0 in | Wt 300.0 lb

## 2013-08-18 DIAGNOSIS — E042 Nontoxic multinodular goiter: Secondary | ICD-10-CM

## 2013-08-18 NOTE — Patient Instructions (Signed)

## 2013-08-18 NOTE — Progress Notes (Signed)
General Surgery [facility]  Chief Complaint  Patient presents with  . Follow-up    thyroid nodules    HISTORY: Patient is a 47 year old female followed for a dominant left-sided thyroid nodule. She has undergone previous fine-needle aspiration biopsies which showed this to be benign. However on sequential ultrasound scanning it has continued to increase in size from 2.9 cm to 3.5 cm to its current maximum dimension of 4.4 cm. Right thyroid lobe has subcentimeter nodules present and is relatively normal in size.  Patient underwent testing for TSH level. This has increased since one year ago and now measures 3.42.  PERTINENT REVIEW OF SYSTEMS: Patient does note increasing pressure on the neck when lying recumbent. She has had minor discomfort. She has had mild shortness of breath when supine.  EXAM: HEENT: normocephalic; pupils equal and reactive; sclerae clear; dentition good; mucous membranes moist NECK:  Dominant mass palpable in the lower left thyroid lobe extending beneath the left clavicle; mobile with swallowing; nontender; symmetric on extension; no palpable anterior or posterior cervical lymphadenopathy; no supraclavicular masses; no tenderness CHEST: clear to auscultation bilaterally without rales, rhonchi, or wheezes CARDIAC: regular rate and rhythm without significant murmur; peripheral pulses are full EXT:  non-tender without edema; no deformity NEURO: no gross focal deficits; no sign of tremor   IMPRESSION: Dominant left thyroid nodule with interval enlargement, 4.4 cm  PLAN: Patient and I discussed the above findings at length. I am recommending that she undergo a left thyroid lobectomy for removal of the dominant left thyroid nodule. This has persistently increased in size and she is now developing mild compressive symptoms.  Patient and I also discussed her TSH level which has risen to 3.42. With partial thyroidectomy, she will likely require  thyroid hormone replacement. She understands.  Patient would like to discuss left thyroid lobectomy surgery with her family. She will contact us regarding timing of surgery if she decides to proceed. I will go ahead and enter hours into the system so we are prepared to move ahead at her request.  The risks and benefits of the procedure have been discussed at length with the patient.  The patient understands the proposed procedure, potential alternative treatments, and the course of recovery to be expected.  All of the patient's questions have been answered at this time.  The patient wishes to proceed with surgery.  Velora Heckler, MD, [facility] Office: [phone]  Visit Diagnoses: 1. Multinodular goiter (nontoxic), dominant nodule left lobe

## 2013-08-18 NOTE — Telephone Encounter (Signed)
08/18/13 I met with patient and went over benefits and financial responsibilities. She will call me back when she is ready to schedule. skm

## 2013-09-10 ENCOUNTER — Encounter (HOSPITAL_COMMUNITY): Payer: Self-pay | Admitting: Pharmacy Technician

## 2013-09-14 ENCOUNTER — Encounter (HOSPITAL_COMMUNITY)
Admission: RE | Admit: 2013-09-14 | Discharge: 2013-09-14 | Disposition: A | Discharge status: Home / Self Care | Payer: BC Managed Care – PPO | Source: Ambulatory Visit | Attending: Surgery | Admitting: Surgery

## 2013-09-14 ENCOUNTER — Encounter (HOSPITAL_COMMUNITY): Payer: Self-pay

## 2013-09-14 ENCOUNTER — Encounter (INDEPENDENT_AMBULATORY_CARE_PROVIDER_SITE_OTHER): Payer: Self-pay

## 2013-09-14 ENCOUNTER — Ambulatory Visit (HOSPITAL_COMMUNITY)
Admission: RE | Admit: 2013-09-14 | Discharge: 2013-09-14 | Disposition: A | Discharge status: Home / Self Care | Payer: BC Managed Care – PPO | Source: Ambulatory Visit | Attending: Surgery | Admitting: Surgery

## 2013-09-14 DIAGNOSIS — Z01818 Encounter for other preprocedural examination: Secondary | ICD-10-CM | POA: Insufficient documentation

## 2013-09-14 DIAGNOSIS — Z01812 Encounter for preprocedural laboratory examination: Secondary | ICD-10-CM | POA: Insufficient documentation

## 2013-09-14 DIAGNOSIS — E079 Disorder of thyroid, unspecified: Secondary | ICD-10-CM | POA: Insufficient documentation

## 2013-09-14 HISTORY — DX: Prediabetes: R73.03

## 2013-09-14 HISTORY — DX: Personal history of other diseases of the digestive system: Z87.19

## 2013-09-14 HISTORY — DX: Personal history of other medical treatment: Z92.89

## 2013-09-14 LAB — BASIC METABOLIC PANEL
BUN: 7 mg/dL (ref 6–23)
CO2: 28 mEq/L (ref 19–32)
Glucose, Bld: 197 mg/dL — ABNORMAL HIGH (ref 70–99)
Potassium: 4 mEq/L (ref 3.5–5.1)
Sodium: 137 mEq/L (ref 135–145)

## 2013-09-14 LAB — CBC
HCT: 42.6 % (ref 36.0–46.0)
Hemoglobin: 14.9 g/dL (ref 12.0–15.0)
MCH: 32.5 pg (ref 26.0–34.0)
RBC: 4.59 MIL/uL (ref 3.87–5.11)

## 2013-09-14 NOTE — Patient Instructions (Signed)
NANDA BITTICK  09/14/2013                           YOUR PROCEDURE IS SCHEDULED ON: 09/25/13               PLEASE REPORT TO SHORT STAY CENTER AT : 7:00 AM               CALL THIS NUMBER IF ANY PROBLEMS THE DAY OF SURGERY :               [phone]                      REMEMBER:   Do not eat food or drink liquids AFTER MIDNIGHT   Take these medicines the morning of surgery with A SIP OF WATER:  ALLEGRA   Do not wear jewelry, make-up   Do not wear lotions, powders, or perfumes.   Do not shave legs or underarms 12 hrs. before surgery (men may shave face)  Do not bring valuables to the hospital.  Contacts, dentures or bridgework may not be worn into surgery.  Leave suitcase in the car. After surgery it may be brought to your room.  For patients admitted to the hospital more than one night, checkout time is 11:00                          The day of discharge.   Patients discharged the day of surgery will not be allowed to drive home                             If going home same day of surgery, must have someone stay with you first                           24 hrs at home and arrange for some one to drive you home from hospital.    Special Instructions:   Please read over the following fact sheets that you were given:                                 1. [facility] PREPARING FOR SURGERY SHEET                2. STOP ALL ASPIRIN AND HERBAL MEDS 5 DAYS PREOP                                                X_____________________________________________________________________        Failure to follow these instructions may result in cancellation of your surgery

## 2013-09-25 ENCOUNTER — Encounter (HOSPITAL_COMMUNITY)
Admission: RE | Disposition: A | Discharge status: Home / Self Care | Payer: Self-pay | Source: Ambulatory Visit | Attending: Surgery

## 2013-09-25 ENCOUNTER — Encounter (HOSPITAL_COMMUNITY): Payer: BC Managed Care – PPO | Admitting: Certified Registered Nurse Anesthetist

## 2013-09-25 ENCOUNTER — Observation Stay (HOSPITAL_COMMUNITY)
Admission: RE | Admit: 2013-09-25 | Discharge: 2013-09-26 | Disposition: A | Discharge status: Home / Self Care | Payer: BC Managed Care – PPO | Source: Ambulatory Visit | Attending: Surgery | Admitting: Surgery

## 2013-09-25 ENCOUNTER — Ambulatory Visit (HOSPITAL_COMMUNITY): Payer: BC Managed Care – PPO | Admitting: Certified Registered Nurse Anesthetist

## 2013-09-25 ENCOUNTER — Encounter (HOSPITAL_COMMUNITY): Payer: Self-pay | Admitting: *Deleted

## 2013-09-25 DIAGNOSIS — J45909 Unspecified asthma, uncomplicated: Secondary | ICD-10-CM | POA: Insufficient documentation

## 2013-09-25 DIAGNOSIS — K219 Gastro-esophageal reflux disease without esophagitis: Secondary | ICD-10-CM | POA: Insufficient documentation

## 2013-09-25 DIAGNOSIS — Z880 Allergy status to penicillin: Secondary | ICD-10-CM | POA: Insufficient documentation

## 2013-09-25 DIAGNOSIS — Z882 Allergy status to sulfonamides status: Secondary | ICD-10-CM | POA: Insufficient documentation

## 2013-09-25 DIAGNOSIS — E041 Nontoxic single thyroid nodule: Secondary | ICD-10-CM | POA: Diagnosis present

## 2013-09-25 DIAGNOSIS — K449 Diaphragmatic hernia without obstruction or gangrene: Secondary | ICD-10-CM | POA: Insufficient documentation

## 2013-09-25 DIAGNOSIS — D34 Benign neoplasm of thyroid gland: Principal | ICD-10-CM | POA: Insufficient documentation

## 2013-09-25 DIAGNOSIS — Z856 Personal history of leukemia: Secondary | ICD-10-CM | POA: Insufficient documentation

## 2013-09-25 DIAGNOSIS — E042 Nontoxic multinodular goiter: Secondary | ICD-10-CM | POA: Insufficient documentation

## 2013-09-25 HISTORY — PX: THYROID LOBECTOMY: SHX420

## 2013-09-25 SURGERY — LOBECTOMY, THYROID
Anesthesia: General | Site: Neck | Laterality: Left

## 2013-09-25 MED ORDER — LACTATED RINGERS IV SOLN
INTRAVENOUS | Status: DC
  Administered 2013-09-25 (×3): via INTRAVENOUS

## 2013-09-25 MED ORDER — DEXAMETHASONE SODIUM PHOSPHATE 10 MG/ML IJ SOLN
INTRAMUSCULAR | Status: AC
  Filled 2013-09-25: qty 1

## 2013-09-25 MED ORDER — FENTANYL CITRATE 0.05 MG/ML IJ SOLN
INTRAMUSCULAR | Status: DC | PRN
  Administered 2013-09-25: 100 ug via INTRAVENOUS
  Administered 2013-09-25 (×2): 50 ug via INTRAVENOUS
  Administered 2013-09-25: 100 ug via INTRAVENOUS

## 2013-09-25 MED ORDER — HYDROCODONE-ACETAMINOPHEN 5-325 MG PO TABS
1.0000 | ORAL_TABLET | ORAL | Status: DC | PRN
  Administered 2013-09-26 (×2): 2 via ORAL
  Filled 2013-09-25 (×2): qty 2

## 2013-09-25 MED ORDER — HYDROMORPHONE HCL PF 1 MG/ML IJ SOLN
0.2500 mg | INTRAMUSCULAR | Status: DC | PRN
  Administered 2013-09-25 (×4): 0.5 mg via INTRAVENOUS

## 2013-09-25 MED ORDER — ONDANSETRON HCL 4 MG/2ML IJ SOLN
INTRAMUSCULAR | Status: AC
  Filled 2013-09-25: qty 2

## 2013-09-25 MED ORDER — ONDANSETRON HCL 4 MG/2ML IJ SOLN
INTRAMUSCULAR | Status: DC | PRN
  Administered 2013-09-25: 4 mg via INTRAVENOUS

## 2013-09-25 MED ORDER — PROMETHAZINE HCL 25 MG/ML IJ SOLN: 6.2500 mg | INTRAMUSCULAR | Status: DC | PRN

## 2013-09-25 MED ORDER — ROCURONIUM BROMIDE 100 MG/10ML IV SOLN
INTRAVENOUS | Status: DC | PRN
  Administered 2013-09-25: 60 mg via INTRAVENOUS

## 2013-09-25 MED ORDER — ROCURONIUM BROMIDE 100 MG/10ML IV SOLN
INTRAVENOUS | Status: AC
  Filled 2013-09-25: qty 1

## 2013-09-25 MED ORDER — 0.9 % SODIUM CHLORIDE (POUR BTL) OPTIME
TOPICAL | Status: DC | PRN
  Administered 2013-09-25: 1000 mL

## 2013-09-25 MED ORDER — GLYCOPYRROLATE 0.2 MG/ML IJ SOLN
INTRAMUSCULAR | Status: DC | PRN
  Administered 2013-09-25: .8 mg via INTRAVENOUS

## 2013-09-25 MED ORDER — LIDOCAINE HCL (CARDIAC) 20 MG/ML IV SOLN
INTRAVENOUS | Status: AC
  Filled 2013-09-25: qty 5

## 2013-09-25 MED ORDER — VANCOMYCIN HCL 10 G IV SOLR
1500.0000 mg | INTRAVENOUS | Status: AC
  Administered 2013-09-25: 1500 mg via INTRAVENOUS
  Filled 2013-09-25: qty 1500

## 2013-09-25 MED ORDER — PROPOFOL 10 MG/ML IV BOLUS
INTRAVENOUS | Status: AC
  Filled 2013-09-25: qty 20

## 2013-09-25 MED ORDER — HYDROMORPHONE HCL PF 1 MG/ML IJ SOLN
1.0000 mg | INTRAMUSCULAR | Status: DC | PRN
  Administered 2013-09-25 (×3): 1 mg via INTRAVENOUS
  Filled 2013-09-25 (×3): qty 1

## 2013-09-25 MED ORDER — FENTANYL CITRATE 0.05 MG/ML IJ SOLN
INTRAMUSCULAR | Status: AC
  Filled 2013-09-25: qty 5

## 2013-09-25 MED ORDER — MIDAZOLAM HCL 2 MG/2ML IJ SOLN
INTRAMUSCULAR | Status: AC
  Filled 2013-09-25: qty 2

## 2013-09-25 MED ORDER — MIDAZOLAM HCL 5 MG/5ML IJ SOLN
INTRAMUSCULAR | Status: DC | PRN
  Administered 2013-09-25: 2 mg via INTRAVENOUS

## 2013-09-25 MED ORDER — DEXAMETHASONE SODIUM PHOSPHATE 10 MG/ML IJ SOLN
INTRAMUSCULAR | Status: DC | PRN
  Administered 2013-09-25: 10 mg via INTRAVENOUS

## 2013-09-25 MED ORDER — ONDANSETRON HCL 4 MG PO TABS: 4.0000 mg | ORAL_TABLET | Freq: Four times a day (QID) | ORAL | Status: DC | PRN

## 2013-09-25 MED ORDER — ONDANSETRON HCL 4 MG/2ML IJ SOLN: 4.0000 mg | Freq: Four times a day (QID) | INTRAMUSCULAR | Status: DC | PRN

## 2013-09-25 MED ORDER — ACETAMINOPHEN 325 MG PO TABS: 650.0000 mg | ORAL_TABLET | ORAL | Status: DC | PRN

## 2013-09-25 MED ORDER — FENTANYL CITRATE 0.05 MG/ML IJ SOLN
INTRAMUSCULAR | Status: AC
  Filled 2013-09-25: qty 2

## 2013-09-25 MED ORDER — NEOSTIGMINE METHYLSULFATE 1 MG/ML IJ SOLN
INTRAMUSCULAR | Status: DC | PRN
  Administered 2013-09-25: 5 mg via INTRAVENOUS

## 2013-09-25 MED ORDER — HYDROMORPHONE HCL PF 1 MG/ML IJ SOLN
INTRAMUSCULAR | Status: AC
  Filled 2013-09-25: qty 1

## 2013-09-25 MED ORDER — LIDOCAINE HCL (PF) 2 % IJ SOLN
INTRAMUSCULAR | Status: DC | PRN
  Administered 2013-09-25: 100 mg via INTRADERMAL

## 2013-09-25 MED ORDER — SUCCINYLCHOLINE CHLORIDE 20 MG/ML IJ SOLN
INTRAMUSCULAR | Status: DC | PRN
  Administered 2013-09-25: 200 mg via INTRAVENOUS

## 2013-09-25 MED ORDER — KCL IN DEXTROSE-NACL 20-5-0.45 MEQ/L-%-% IV SOLN
INTRAVENOUS | Status: AC
  Filled 2013-09-25: qty 1000

## 2013-09-25 MED ORDER — KCL IN DEXTROSE-NACL 20-5-0.45 MEQ/L-%-% IV SOLN
INTRAVENOUS | Status: DC
  Administered 2013-09-25: 16:00:00 via INTRAVENOUS
  Filled 2013-09-25 (×2): qty 1000

## 2013-09-25 MED ORDER — PROPOFOL 10 MG/ML IV BOLUS
INTRAVENOUS | Status: DC | PRN
  Administered 2013-09-25: 200 mg via INTRAVENOUS

## 2013-09-25 SURGICAL SUPPLY — 37 items
APL SKNCLS STERI-STRIP NONHPOA (GAUZE/BANDAGES/DRESSINGS) ×1
ATTRACTOMAT 16X20 MAGNETIC DRP (DRAPES) ×2 IMPLANT
BENZOIN TINCTURE PRP APPL 2/3 (GAUZE/BANDAGES/DRESSINGS) ×2 IMPLANT
BLADE HEX COATED 2.75 (ELECTRODE) ×2 IMPLANT
BLADE SURG 15 STRL LF DISP TIS (BLADE) ×1 IMPLANT
BLADE SURG 15 STRL SS (BLADE) ×2
CANISTER SUCTION 2500CC (MISCELLANEOUS) ×2 IMPLANT
CHLORAPREP W/TINT 10.5 ML (MISCELLANEOUS) ×2 IMPLANT
CLIP TI MEDIUM 6 (CLIP) ×8 IMPLANT
CLIP TI WIDE RED SMALL 6 (CLIP) ×5 IMPLANT
DISSECTOR ROUND CHERRY 3/8 STR (MISCELLANEOUS) IMPLANT
DRAPE PED LAPAROTOMY (DRAPES) ×2 IMPLANT
DRESSING SURGICEL FIBRLLR 1X2 (HEMOSTASIS) ×1 IMPLANT
DRSG SURGICEL FIBRILLAR 1X2 (HEMOSTASIS) ×2
ELECT REM PT RETURN 9FT ADLT (ELECTROSURGICAL) ×2
ELECTRODE REM PT RTRN 9FT ADLT (ELECTROSURGICAL) ×1 IMPLANT
GAUZE SPONGE 4X4 16PLY XRAY LF (GAUZE/BANDAGES/DRESSINGS) ×2 IMPLANT
GLOVE SURG ORTHO 8.0 STRL STRW (GLOVE) ×2 IMPLANT
GOWN PREVENTION PLUS LG XLONG (DISPOSABLE) ×2 IMPLANT
GOWN STRL REIN XL XLG (GOWN DISPOSABLE) ×4 IMPLANT
KIT BASIN OR (CUSTOM PROCEDURE TRAY) ×2 IMPLANT
NS IRRIG 1000ML POUR BTL (IV SOLUTION) ×2 IMPLANT
PACK BASIC VI WITH GOWN DISP (CUSTOM PROCEDURE TRAY) ×2 IMPLANT
PENCIL BUTTON HOLSTER BLD 10FT (ELECTRODE) ×2 IMPLANT
SHEARS HARMONIC 9CM CVD (BLADE) ×2 IMPLANT
SPONGE GAUZE 4X4 12PLY (GAUZE/BANDAGES/DRESSINGS) ×1 IMPLANT
STAPLER VISISTAT 35W (STAPLE) ×1 IMPLANT
STRIP CLOSURE SKIN 1/2X4 (GAUZE/BANDAGES/DRESSINGS) ×2 IMPLANT
SUT MNCRL AB 4-0 PS2 18 (SUTURE) ×2 IMPLANT
SUT SILK 2 0 (SUTURE) ×2
SUT SILK 2-0 18XBRD TIE 12 (SUTURE) ×1 IMPLANT
SUT SILK 3 0 (SUTURE)
SUT SILK 3-0 18XBRD TIE 12 (SUTURE) IMPLANT
SUT VIC AB 3-0 SH 18 (SUTURE) ×3 IMPLANT
SYR BULB IRRIGATION 50ML (SYRINGE) ×2 IMPLANT
TOWEL OR 17X26 10 PK STRL BLUE (TOWEL DISPOSABLE) ×2 IMPLANT
YANKAUER SUCT BULB TIP 10FT TU (MISCELLANEOUS) ×2 IMPLANT

## 2013-09-25 NOTE — Discharge Instructions (Signed)

## 2013-09-25 NOTE — H&P (Signed)
Marissa [name] is an 48 y.o. female.    General Surgery [facility]  Chief Complaint:  Enlarging left thyroid nodule  HPI: Patient is a 48 year old female followed for a dominant left-sided thyroid nodule. She has undergone previous fine-needle aspiration biopsies which showed this to be benign. However on sequential ultrasound scanning it has continued to increase in size from 2.9 cm to 3.5 cm to its current maximum dimension of 4.4 cm. Right thyroid lobe has subcentimeter nodules present and is relatively normal in size.  Plan left thyroid lobectomy for definitive diagnosis.  Past Medical History  Diagnosis Date  . IBS (irritable bowel syndrome)   . Anemia   . Hemorrhoids   . Thyroid nodule   . Plantar fasciitis   . Asthma   . GERD (gastroesophageal reflux disease)   . Abdominal pain     INTERMITANT  . Rectal pain   . Plantar fasciitis of left foot FALL 2012    HAD SURGERY TO FIX IT  . 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 H/O hiatal hernia   . History of transfusion   . Prediabetes     Past Surgical History  Procedure Laterality Date  . Carpel tunnel  2004    rt hand  . Plantar fascia surgery  2004, 2012    rt foot, left foot  . Nasal sinus surgery  1999  . Biopsy thyroid  11/2009    benign  . Eye biospy  1979    dx with leukemia from this biospy  . Eye biospy  1979    rt eye, dx: leukemia  . Eye surgery  1982    rt eye  . Radical hysterectomy    . Abdominal hysterectomy  2003    BSO  . Skin cancer removal on shoulder  2/14    Family History  Problem Relation Age of Onset  . Cancer Mother 74    LUNG  . Diabetes Mother   . Diabetes Father   . Stroke Father   . Heart disease Father   . Vision loss Father     GLAUCOMA  . Cancer Maternal Aunt     breast  . Diverticulosis     Social History:  reports that she quit smoking about 22 years ago. She has never used smokeless  tobacco. She reports that she does not drink alcohol or use illicit drugs.  Allergies:  Allergies  Allergen Reactions  . Erythromycin Shortness Of Breath  . Penicillins Shortness Of Breath  . Quinine Derivatives Shortness Of Breath  . Sulfa Antibiotics Shortness Of Breath    Medications Prior to Admission  Medication Sig Dispense Refill  . fexofenadine (ALLEGRA) 180 MG tablet Take 180 mg by mouth daily.      Marland Kitchen ibuprofen (ADVIL,MOTRIN) 200 MG tablet Take 800 mg by mouth every 6 (six) hours as needed for mild pain or moderate pain.         No results found for this or any previous visit (from the past 48 hour(s)). No results found.  Review of Systems  Constitutional: Negative.   HENT: Negative.   Eyes: Negative.   Respiratory: Negative.   Cardiovascular: Negative.   Gastrointestinal: Negative.   Genitourinary: Negative.   Musculoskeletal: Negative.   Skin: Negative.   Neurological: Negative.   Endo/Heme/Allergies: Negative.   Psychiatric/Behavioral: Negative.     Blood pressure  151/78, pulse 96, temperature 98.3 F (36.8 C), temperature source Oral, resp. rate 18, last menstrual period 07/23/2002, SpO2 99.00%. Physical Exam  Constitutional: She is oriented to person, place, and time. She appears well-developed and well-nourished. No distress.  HENT:  Head: Normocephalic and atraumatic.  Right Ear: External ear normal.  Left Ear: External ear normal.  Eyes: Conjunctivae are normal. Pupils are equal, round, and reactive to light. No scleral icterus.  Neck: Normal range of motion. Neck supple. No tracheal deviation present. Thyromegaly present.  Palpable left thyroid nodule  Cardiovascular: Normal rate, regular rhythm and normal heart sounds.   Respiratory: Effort normal and breath sounds normal. She has no wheezes.  GI: Soft. Bowel sounds are normal. She exhibits no distension.  Musculoskeletal: Normal range of motion. She exhibits no edema.  Lymphadenopathy:    She has  no cervical adenopathy.  Neurological: She is alert and oriented to person, place, and time.  Skin: Skin is warm and dry.  Psychiatric: She has a normal mood and affect. Her behavior is normal.     Assessment/Plan Enlarging left thyroid nodule  Plan left thyroid lobectomy  The risks and benefits of the procedure have been discussed at length with the patient.  The patient understands the proposed procedure, potential alternative treatments, and the course of recovery to be expected.  All of the patient's questions have been answered at this time.  The patient wishes to proceed with surgery.  Earnstine Regal, MD, [facility] Office: [place] 09/25/2013, 10:40 AM

## 2013-09-25 NOTE — Op Note (Signed)
NAMEALMINA, Marissa [name]               ACCOUNT NO.:  [id number]  MEDICAL RECORD NO.:  [id number]  LOCATION:  [facility]                         FACILITY:  [facility]  PHYSICIAN:  Earnstine Regal, MD      DATE OF BIRTH:  [birth year]  DATE OF PROCEDURE:  09/25/2013                               OPERATIVE REPORT   PREOPERATIVE DIAGNOSIS:  Enlarging left thyroid nodule.  POSTOPERATIVE DIAGNOSIS:  Enlarging left thyroid nodule.  PROCEDURE:  Left thyroid lobectomy.  SURGEON:  Earnstine Regal, MD, FACS  ANESTHESIA:  General per Dr. Myrtie Soman.  ESTIMATED BLOOD LOSS:  Minimal.  PREPARATION:  ChloraPrep.  COMPLICATIONS:  None.  INDICATIONS:  The patient is a 48 year old female followed in my practice for a dominant left-sided thyroid nodule.  Previous fine needle aspiration biopsy has shown benign cytopathology.  However, sequential ultrasound scanning has shown a significant increase in size to its current dimension of 4.4 cm.  The patient now comes to Surgery for resection for definitive diagnosis.  BODY OF REPORT:  Procedure was done in OR #1 at the [facility].  The patient was brought to the operating room, placed in supine position on the operating room table.  Following administration of general anesthesia, the patient was positioned and then prepped and draped in the usual aseptic fashion.  After ascertaining that an adequate level of anesthesia had been achieved, a Kocher incision was made with a #15 blade.  Dissection was carried through subcutaneous tissues and platysma.  Hemostasis was achieved with the electrocautery.  Skin flaps were elevated cephalad and caudad from the thyroid notch to the sternal notch.  A Mahorner self-retaining retractor was placed for exposure.  Strap muscles were incised in the midline.  Palpation of the right thyroid lobe shows no dominant masses. Left thyroid lobe was exposed by reflecting the strap muscles laterally. Left lobe was gently  dissected out.  There was a dominant mass in the inferior and posterior portion of the left lobe.  Using gentle blunt dissection, the gland was mobilized.  Venous tributaries were divided between Ligaclips with the Harmonic scalpel.  Superior pole was dissected out and superior pole vessels divided between small and medium Ligaclips with the Harmonic scalpel.  Parathyroid tissue was identified and preserved.  Gland was rolled anteriorly.  Branches of the inferior thyroid artery were divided between small and medium Ligaclips. Recurrent laryngeal nerve was identified and preserved along its course. Inferior venous tributaries were divided between Ligaclips.  The dominant nodule appears somewhat inflammatory.  It was adherent to the surrounding tissues.  It was gently separated with blunt dissection and use of the Harmonic scalpel.  The entire lobe was then mobilized anteriorly.  Ligament of Gwenlyn Found was released with the electrocautery and the isthmus was mobilized across the midline.  Isthmus was transected at its junction with the right thyroid lobe with the Harmonic scalpel. Specimen was submitted in its entirety to Pathology for review.  The neck was irrigated with warm saline.  Good hemostasis was noted. Fibrillar was placed throughout the operative field.  Strap muscles were reapproximated in the midline with interrupted 3-0 Vicryl sutures. Platysma was closed with interrupted 3-0 Vicryl  sutures.  Skin was closed with a running 4-0 Monocryl subcuticular suture.  Wound was washed and dried.  Benzoin and Steri-Strips were applied.  Sterile dressings were applied.  The patient was awakened from anesthesia and brought to the recovery room.  The patient tolerated the procedure well.   Earnstine Regal, MD, [facility] Office: [phone]   TMG/MEDQ  D:  09/25/2013  T:  09/25/2013  Job:  [id number]

## 2013-09-25 NOTE — Transfer of Care (Signed)
Immediate Anesthesia Transfer of Care Note  Patient: Marissa [name] Reasons  Procedure(s) Performed: Procedure(s): LEFT THYROID LOBECTOMY (Left)  Patient Location: PACU  Anesthesia Type:General  Level of Consciousness: awake, alert  and oriented  Airway & Oxygen Therapy: Patient Spontanous Breathing, Patient connected to face mask oxygen and patient removed nasal airway on arrival to PACU  Post-op Assessment: Report given to PACU RN, Post -op Vital signs reviewed and stable and Patient moving all extremities X 4  Post vital signs: Reviewed and stable  Complications: No apparent anesthesia complications

## 2013-09-25 NOTE — Anesthesia Postprocedure Evaluation (Signed)
  Anesthesia Post-op Note  Patient: Marissa [name]  Procedure(s) Performed: Procedure(s) (LRB): LEFT THYROID LOBECTOMY (Left)  Patient Location: PACU  Anesthesia Type: General  Level of Consciousness: awake and alert   Airway and Oxygen Therapy: Patient Spontanous Breathing  Post-op Pain: mild  Post-op Assessment: Post-op Vital signs reviewed, Patient's Cardiovascular Status Stable, Respiratory Function Stable, Patent Airway and No signs of Nausea or vomiting  Last Vitals:  Filed Vitals:   09/25/13 1245  BP: 150/86  Pulse: 98  Temp:   Resp: 23    Post-op Vital Signs: stable   Complications: No apparent anesthesia complications

## 2013-09-25 NOTE — Brief Op Note (Signed)
09/25/2013  12:22 PM  PATIENT:  Marissa [name]  48 y.o. female  PRE-OPERATIVE DIAGNOSIS:  enlarging thyroid nodules  POST-OPERATIVE DIAGNOSIS:  enlarging thyroid nodules  PROCEDURE:  Procedure(s): LEFT THYROID LOBECTOMY (Left)  SURGEON:  Surgeon(s) and Role:    * Earnstine Regal, MD - Primary  ANESTHESIA:   general  EBL:  Total I/O In: 800 [I.V.:800] Out: -   BLOOD ADMINISTERED:none  DRAINS: none   LOCAL MEDICATIONS USED:  NONE  SPECIMEN:  Excision  DISPOSITION OF SPECIMEN:  PATHOLOGY  COUNTS:  YES  TOURNIQUET:  * No tourniquets in log *  DICTATION: .Other Dictation: Dictation Number [id number]  PLAN OF CARE: Admit for overnight observation  PATIENT DISPOSITION:  PACU - hemodynamically stable.   Delay start of Pharmacological VTE agent (>24hrs) due to surgical blood loss or risk of bleeding: yes  Earnstine Regal, MD, [facility] Office: [phone]

## 2013-09-25 NOTE — Anesthesia Preprocedure Evaluation (Signed)
Anesthesia Evaluation  Patient identified by MRN, date of birth, ID band Patient awake    Reviewed: Allergy & Precautions, H&P , NPO status , Patient's Chart, lab work & pertinent test results  Airway Mallampati: III TM Distance: <3 FB Neck ROM: Full    Dental no notable dental hx.    Pulmonary neg pulmonary ROS, former smoker,  breath sounds clear to auscultation  + decreased breath sounds      Cardiovascular negative cardio ROS  Rhythm:Regular Rate:Normal     Neuro/Psych negative neurological ROS  negative psych ROS   GI/Hepatic Neg liver ROS, GERD-  ,  Endo/Other  Morbid obesity  Renal/GU negative Renal ROS  negative genitourinary   Musculoskeletal negative musculoskeletal ROS (+)   Abdominal (+) + obese,   Peds negative pediatric ROS (+)  Hematology negative hematology ROS (+)   Anesthesia Other Findings   Reproductive/Obstetrics negative OB ROS                           Anesthesia Physical Anesthesia Plan  ASA: III  Anesthesia Plan: General   Post-op Pain Management:    Induction: Intravenous  Airway Management Planned: Oral ETT  Additional Equipment:   Intra-op Plan:   Post-operative Plan: Extubation in OR  Informed Consent: I have reviewed the patients History and Physical, chart, labs and discussed the procedure including the risks, benefits and alternatives for the proposed anesthesia with the patient or authorized representative who has indicated his/her understanding and acceptance.   Dental advisory given  Plan Discussed with: CRNA and Surgeon  Anesthesia Plan Comments:         Anesthesia Quick Evaluation

## 2013-09-26 MED ORDER — OXYCODONE-ACETAMINOPHEN 5-325 MG PO TABS: 1.0000 | ORAL_TABLET | ORAL | Status: DC | PRN

## 2013-09-26 NOTE — Progress Notes (Signed)
Utilization Review completed.  

## 2013-09-26 NOTE — Discharge Summary (Signed)
Physician Discharge Summary  Patient ID: Marissa [name] MRN: [id number] DOB/AGE: [birth year] 48 y.o.  Admit date: 09/25/2013 Discharge date: 09/26/2013  Admission Diagnoses:  Discharge Diagnoses:  Principal Problem:   Multinodular goiter (nontoxic), dominant nodule left lobe Active Problems:   Thyroid nodule   Discharged Condition: good  Hospital Course: uneventful post op recovery  Consults: None  Significant Diagnostic Studies:   Treatments: surgery: left thyroid lobectomy  Discharge Exam: Blood pressure 120/76, pulse 84, temperature 98.2 F (36.8 C), temperature source Oral, resp. rate 20, height 5' 3.5" (1.613 m), weight 300 lb 2 oz (136.136 kg), last menstrual period 07/23/2002, SpO2 94.00%. General appearance: alert, cooperative and no distress Incision/Wound:no neck hematoma  Disposition:    Future Appointments Provider Department Dept Phone   10/14/2013 11:00 AM Earnstine Regal, MD [facility], [place] [phone]       Medication List         fexofenadine 180 MG tablet  Commonly known as:  ALLEGRA  Take 180 mg by mouth daily.     ibuprofen 200 MG tablet  Commonly known as:  ADVIL,MOTRIN  Take 800 mg by mouth every 6 (six) hours as needed for mild pain or moderate pain.     oxyCODONE-acetaminophen 5-325 MG per tablet  Commonly known as:  ROXICET  Take 1-2 tablets by mouth every 4 (four) hours as needed for severe pain.           Follow-up Information   Follow up with Earnstine Regal, MD. Schedule an appointment as soon as possible for a visit in 3 weeks.   Specialty:  General Surgery   Contact information:   [address] [place] [address] [phone]       Signed: Harl Bowie 09/26/2013, 8:28 AM

## 2013-09-26 NOTE — Progress Notes (Signed)
Assessment unchanged. Pt verbalized understanding of dc instructions and My Chart through teach back. Script x 1 given as provided by MD. Verbalized s/s of tetany to nurse. Discharged via wc to front entrance to meet awaiting vehicle and sister to carry home. Accompanied by NT.

## 2013-09-26 NOTE — Progress Notes (Signed)
1 Day Post-Op  Subjective: Doing well  Objective: Vital signs in last 24 hours: Temp:  [97.4 F (36.3 C)-99 F (37.2 C)] 98.2 F (36.8 C) (01/03 0610) Pulse Rate:  [84-103] 84 (01/03 0610) Resp:  [17-25] 20 (01/03 0610) BP: (120-168)/(71-89) 120/76 mmHg (01/03 0610) SpO2:  [91 %-96 %] 94 % (01/03 0610) Weight:  [300 lb 2 oz (136.136 kg)] 300 lb 2 oz (136.136 kg) (01/02 1500) Last BM Date: 09/25/13  Intake/Output from previous day: 01/02 0701 - 01/03 0700 In: 2778.3 [P.O.:600; I.V.:2178.3] Out: 4000 [Urine:4000] Intake/Output this shift: Total I/O In: -  Out: 700 [Urine:700]  Comfortable Neck with no hematoma, incision clean   Lab Results:  No results found for this basename: WBC, HGB, HCT, PLT,  in the last 72 hours BMET No results found for this basename: NA, K, CL, CO2, GLUCOSE, BUN, CREATININE, CALCIUM,  in the last 72 hours PT/INR No results found for this basename: LABPROT, INR,  in the last 72 hours ABG No results found for this basename: PHART, PCO2, PO2, HCO3,  in the last 72 hours  Studies/Results: No results found.  Anti-infectives: Anti-infectives   Start     Dose/Rate Route Frequency Ordered Stop   09/25/13 0730  vancomycin (VANCOCIN) 1,500 mg in sodium chloride 0.9 % 500 mL IVPB     1,500 mg 250 mL/hr over 120 Minutes Intravenous On call to O.R. 09/25/13 0708 09/25/13 0950      Assessment/Plan: s/p Procedure(s): LEFT THYROID LOBECTOMY (Left)  Discharge home  LOS: 1 day    Elva Mauro A 09/26/2013

## 2013-09-28 ENCOUNTER — Encounter (HOSPITAL_COMMUNITY): Payer: Self-pay | Admitting: Surgery

## 2013-09-28 ENCOUNTER — Telehealth (INDEPENDENT_AMBULATORY_CARE_PROVIDER_SITE_OTHER): Payer: Self-pay

## 2013-09-28 NOTE — Telephone Encounter (Signed)
Patient called to report she has had bad headaches since before she was discharged from the hospital.  Her thyroid surgery was 09/25/13.  It is throbbing at her temples.  She is not on pain medicine.  Her incision site is sore and tender but she is not really hurting.  She wants to know if the headache is normal.

## 2013-09-28 NOTE — Progress Notes (Signed)
Quick Note:  Please contact patient and notify of benign pathology results.  Abbiegail Landgren M. Kristl Morioka, MD, FACS [facility] Office: [phone]   ______ 

## 2013-09-28 NOTE — Telephone Encounter (Signed)
Returned call to pt. Per Dr Gala Lewandowsky request pt advised to d/c narcotic meds and got to advil for headache. Pt states head ache comes and goes and is not constant.Pt will monitor blood pressure.  Pt will take advil and call if headache does not resolve or other symptoms occur.

## 2013-09-30 ENCOUNTER — Telehealth (INDEPENDENT_AMBULATORY_CARE_PROVIDER_SITE_OTHER): Payer: Self-pay

## 2013-09-30 NOTE — Telephone Encounter (Signed)
Per Dr Gala Lewandowsky request pt notified of path result(benign).

## 2013-10-14 ENCOUNTER — Encounter (INDEPENDENT_AMBULATORY_CARE_PROVIDER_SITE_OTHER): Payer: Self-pay | Admitting: Surgery

## 2013-10-14 ENCOUNTER — Ambulatory Visit (INDEPENDENT_AMBULATORY_CARE_PROVIDER_SITE_OTHER): Payer: BC Managed Care – PPO | Admitting: Surgery

## 2013-10-14 ENCOUNTER — Encounter (INDEPENDENT_AMBULATORY_CARE_PROVIDER_SITE_OTHER): Payer: Self-pay

## 2013-10-14 ENCOUNTER — Other Ambulatory Visit (INDEPENDENT_AMBULATORY_CARE_PROVIDER_SITE_OTHER): Payer: Self-pay

## 2013-10-14 VITALS — BP 138/80 | HR 100 | Temp 97.4°F | Resp 24 | Ht 64.0 in | Wt 295.2 lb

## 2013-10-14 DIAGNOSIS — E039 Hypothyroidism, unspecified: Secondary | ICD-10-CM

## 2013-10-14 DIAGNOSIS — E042 Nontoxic multinodular goiter: Secondary | ICD-10-CM

## 2013-10-14 NOTE — Patient Instructions (Signed)
  COCOA BUTTER & VITAMIN E CREAM  ([employer] or other brand)  Apply cocoa butter/vitamin E cream to your incision 2 - 3 times daily.  Massage cream into incision for one minute with each application.  Use sunscreen (50 SPF or higher) for first 6 months after surgery if area is exposed to sun.  You may substitute Mederma or other scar reducing creams as desired.   

## 2013-10-14 NOTE — Progress Notes (Signed)
General Surgery [facility]  Chief Complaint  Patient presents with  . Routine Post Op    left thyroid lobectomy 09/25/2013    HISTORY: Patient is a 48 year old female who underwent left thyroid lobectomy on 09/25/2013. Final pathology showed a follicular adenoma. There was no evidence of malignancy.  Patient is not taking thyroid hormone supplementation at this time.  EXAM: Surgical incision is healing nicely. Mild erythema. No sign of infection. No sign of seroma. Voice quality is normal.  IMPRESSION: Status post left thyroid lobectomy for a follicular adenoma  PLAN: Patient will begin applying topical creams to her incision. We will check a TSH level in 2 weeks. Patient will return in 6 weeks for final wound check.  Earnstine Regal, MD, [facility]   Visit Diagnoses: 1. Multinodular goiter (nontoxic), dominant nodule left lobe

## 2013-10-28 LAB — TSH: TSH: 3.493 u[IU]/mL (ref 0.350–4.500)

## 2013-12-08 ENCOUNTER — Encounter (INDEPENDENT_AMBULATORY_CARE_PROVIDER_SITE_OTHER): Payer: Self-pay | Admitting: Surgery

## 2013-12-08 ENCOUNTER — Ambulatory Visit (INDEPENDENT_AMBULATORY_CARE_PROVIDER_SITE_OTHER): Payer: BC Managed Care – PPO | Admitting: Surgery

## 2013-12-08 VITALS — BP 146/84 | HR 84 | Temp 98.0°F | Resp 20 | Wt 294.0 lb

## 2013-12-08 DIAGNOSIS — E042 Nontoxic multinodular goiter: Secondary | ICD-10-CM

## 2013-12-08 NOTE — Progress Notes (Signed)
General Surgery [facility]  Chief Complaint  Patient presents with  . Routine Post Op    left thyroid lobectomy 09/25/2013    HISTORY: Patient is a 48 year old female who underwent left thyroid lobectomy in January 2015 for benign thyroid nodule. Patient is not on thyroid hormone supplementation. Recent TSH level remains in the normal range postoperatively at 3.49.  EXAM: Surgical wound is healed nicely. There is still some violaceous coloring to the incision but this should fade over the coming months. No sign of seroma or infection. Voice quality is normal.  IMPRESSION: Status post left thyroid lobectomy  PLAN: Patient should have her TSH level checked in approximately 6 months. I will leave that up to her primary care physician.  Patient will continue to apply topical creams to her incision and to use sunscreen during the coming summer months.  Patient will return for surgical care as needed.  Earnstine Regal, MD, [facility]   Visit Diagnoses: 1. Multinodular goiter (nontoxic), dominant nodule left lobe

## 2013-12-08 NOTE — Patient Instructions (Signed)

## 2014-01-05 ENCOUNTER — Other Ambulatory Visit: Payer: Self-pay

## 2014-01-05 DIAGNOSIS — Z1231 Encounter for screening mammogram for malignant neoplasm of breast: Secondary | ICD-10-CM

## 2014-02-02 ENCOUNTER — Ambulatory Visit
Admission: RE | Admit: 2014-02-02 | Discharge: 2014-02-02 | Disposition: A | Discharge status: Home / Self Care | Payer: BC Managed Care – PPO | Source: Ambulatory Visit

## 2014-02-02 ENCOUNTER — Encounter (INDEPENDENT_AMBULATORY_CARE_PROVIDER_SITE_OTHER): Payer: Self-pay

## 2014-02-02 DIAGNOSIS — Z1231 Encounter for screening mammogram for malignant neoplasm of breast: Secondary | ICD-10-CM

## 2014-02-03 ENCOUNTER — Encounter: Payer: Self-pay | Admitting: Women's Health

## 2014-02-03 ENCOUNTER — Ambulatory Visit (INDEPENDENT_AMBULATORY_CARE_PROVIDER_SITE_OTHER): Payer: BC Managed Care – PPO | Admitting: Women's Health

## 2014-02-03 VITALS — BP 116/80 | Ht 63.5 in | Wt 296.0 lb

## 2014-02-03 DIAGNOSIS — Z01419 Encounter for gynecological examination (general) (routine) without abnormal findings: Secondary | ICD-10-CM

## 2014-02-03 NOTE — Patient Instructions (Signed)
Health Recommendations for Postmenopausal Women Respected and ongoing research has looked at the most common causes of death, disability, and poor quality of life in postmenopausal women. The causes include heart disease, diseases of blood vessels, diabetes, depression, cancer, and bone loss (osteoporosis). Many things can be done to help lower the chances of developing these and other common problems: CARDIOVASCULAR DISEASE Heart Disease: A heart attack is a medical emergency. Know the signs and symptoms of a heart attack. Below are things women can do to reduce their risk for heart disease.   Do not smoke. If you smoke, quit.  Aim for a healthy weight. Being overweight causes many preventable deaths. Eat a healthy and balanced diet and drink an adequate amount of liquids.  Get moving. Make a commitment to be more physically active. Aim for 30 minutes of activity on most, if not all days of the week.  Eat for heart health. Choose a diet that is low in saturated fat and cholesterol and eliminate trans fat. Include whole grains, vegetables, and fruits. Read and understand the labels on food containers before buying.  Know your numbers. Ask your caregiver to check your blood pressure, cholesterol (total, HDL, LDL, triglycerides) and blood glucose. Work with your caregiver on improving your entire clinical picture.  High blood pressure. Limit or stop your table salt intake (try salt substitute and food seasonings). Avoid salty foods and drinks. Read labels on food containers before buying. Eating well and exercising can help control high blood pressure. STROKE  Stroke is a medical emergency. Stroke may be the result of a blood clot in a blood vessel in the brain or by a brain hemorrhage (bleeding). Know the signs and symptoms of a stroke. To lower the risk of developing a stroke:  Avoid fatty foods.  Quit smoking.  Control your diabetes, blood pressure, and irregular heart rate. THROMBOPHLEBITIS  (BLOOD CLOT) OF THE LEG  Becoming overweight and leading a stationary lifestyle may also contribute to developing blood clots. Controlling your diet and exercising will help lower the risk of developing blood clots. CANCER SCREENING  Breast Cancer: Take steps to reduce your risk of breast cancer.  You should practice "breast self-awareness." This means understanding the normal appearance and feel of your breasts and should include breast self-examination. Any changes detected, no matter how small, should be reported to your caregiver.  After age 40, you should have a clinical breast exam (CBE) every year.  Starting at age 40, you should consider having a mammogram (breast X-ray) every year.  If you have a family history of breast cancer, talk to your caregiver about genetic screening.  If you are at high risk for breast cancer, talk to your caregiver about having an MRI and a mammogram every year.  Intestinal or Stomach Cancer: Tests to consider are a rectal exam, fecal occult blood, sigmoidoscopy, and colonoscopy. Women who are high risk may need to be screened at an earlier age and more often.  Cervical Cancer:  Beginning at age 30, you should have a Pap test every 3 years as long as the past 3 Pap tests have been normal.  If you have had past treatment for cervical cancer or a condition that could lead to cancer, you need Pap tests and screening for cancer for at least 20 years after your treatment.  If you had a hysterectomy for a problem that was not cancer or a condition that could lead to cancer, then you no longer need Pap tests.    If you are between ages 65 and 70, and you have had normal Pap tests going back 10 years, you no longer need Pap tests.  If Pap tests have been discontinued, risk factors (such as a new sexual partner) need to be reassessed to determine if screening should be resumed.  Some medical problems can increase the chance of getting cervical cancer. In these  cases, your caregiver may recommend more frequent screening and Pap tests.  Uterine Cancer: If you have vaginal bleeding after reaching menopause, you should notify your caregiver.  Ovarian cancer: Other than yearly pelvic exams, there are no reliable tests available to screen for ovarian cancer at this time except for yearly pelvic exams.  Lung Cancer: Yearly chest X-rays can detect lung cancer and should be done on high risk women, such as cigarette smokers and women with chronic lung disease (emphysema).  Skin Cancer: A complete body skin exam should be done at your yearly examination. Avoid overexposure to the sun and ultraviolet light lamps. Use a strong sun block cream when in the sun. All of these things are important in lowering the risk of skin cancer. MENOPAUSE Menopause Symptoms: Hormone therapy products are effective for treating symptoms associated with menopause:  Moderate to severe hot flashes.  Night sweats.  Mood swings.  Headaches.  Tiredness.  Loss of sex drive.  Insomnia.  Other symptoms. Hormone replacement carries certain risks, especially in older women. Women who use or are thinking about using estrogen or estrogen with progestin treatments should discuss that with their caregiver. Your caregiver will help you understand the benefits and risks. The ideal dose of hormone replacement therapy is not known. The Food and Drug Administration (FDA) has concluded that hormone therapy should be used only at the lowest doses and for the shortest amount of time to reach treatment goals.  OSTEOPOROSIS Protecting Against Bone Loss and Preventing Fracture: If you use hormone therapy for prevention of bone loss (osteoporosis), the risks for bone loss must outweigh the risk of the therapy. Ask your caregiver about other medications known to be safe and effective for preventing bone loss and fractures. To guard against bone loss or fractures, the following is recommended:  If  you are less than age 50, take 1000 mg of calcium and at least 600 mg of Vitamin D per day.  If you are greater than age 50 but less than age 70, take 1200 mg of calcium and at least 600 mg of Vitamin D per day.  If you are greater than age 70, take 1200 mg of calcium and at least 800 mg of Vitamin D per day. Smoking and excessive alcohol intake increases the risk of osteoporosis. Eat foods rich in calcium and vitamin D and do weight bearing exercises several times a week as your caregiver suggests. DIABETES Diabetes Melitus: If you have Type I or Type 2 diabetes, you should keep your blood sugar under control with diet, exercise and recommended medication. Avoid too many sweets, starchy and fatty foods. Being overweight can make control more difficult. COGNITION AND MEMORY Cognition and Memory: Menopausal hormone therapy is not recommended for the prevention of cognitive disorders such as Alzheimer's disease or memory loss.  DEPRESSION  Depression may occur at any age, but is common in elderly women. The reasons may be because of physical, medical, social (loneliness), or financial problems and needs. If you are experiencing depression because of medical problems and control of symptoms, talk to your caregiver about this. Physical activity and   exercise may help with mood and sleep. Community and volunteer involvement may help your sense of value and worth. If you have depression and you feel that the problem is getting worse or becoming severe, talk to your caregiver about treatment options that are best for you. ACCIDENTS  Accidents are common and can be serious in the elderly woman. Prepare your house to prevent accidents. Eliminate throw rugs, place hand bars in the bath, shower and toilet areas. Avoid wearing high heeled shoes or walking on wet, snowy, and icy areas. Limit or stop driving if you have vision or hearing problems, or you feel you are unsteady with you movements and  reflexes. HEPATITIS C Hepatitis C is a type of viral infection affecting the liver. It is spread mainly through contact with blood from an infected person. It can be treated, but if left untreated, it can lead to severe liver damage over years. Many people who are infected do not know that the virus is in their blood. If you are a "baby-boomer", it is recommended that you have one screening test for Hepatitis C. IMMUNIZATIONS  Several immunizations are important to consider having during your senior years, including:   Tetanus, diptheria, and pertussis booster shot.  Influenza every year before the flu season begins.  Pneumonia vaccine.  Shingles vaccine.  Others as indicated based on your specific needs. Talk to your caregiver about these. Document Released: 11/02/2005 Document Revised: 08/27/2012 Document Reviewed: 06/28/2008 Exit[employer] Patient Information 2014 [employer].  

## 2014-02-03 NOTE — Progress Notes (Signed)
Marissa [name] [birth year] [id number]    History:    Presents for annual exam.  2003 TAH with BSO for endometriosis/no HRT. 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 Thyroid nodules 09/2013 large benign thyroid nodule removed, TSH stable. Blood sugars have been elevated, has followup with primary care scheduled. Normal Pap and mammogram history. 2013 negative colonoscopy.  2012 DEXA -1 right femoral neck.  Past medical history, past surgical history, family history and social history were all reviewed and documented in the EPIC chart. [employer]employ[employer]. 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 Parents diabetes, father stroke and heart disease.  ROS:  A  12 point ROS was performed and pertinent positives and negatives are included.  Exam:  Filed Vitals:   02/03/14 0925  BP: 116/80    General appearance:  Normal Thyroid:  Symmetrical, normal in size, without palpable masses or nodularity. Respiratory  Auscultation:  Clear without wheezing or rhonchi Cardiovascular  Auscultation:  Regular rate, without rubs, murmurs or gallops  Edema/varicosities:  Not grossly evident Abdominal  Soft,nontender, without masses, guarding or rebound.  Liver/spleen:  No organomegaly noted  Hernia:  None appreciated  Skin  Inspection:  Grossly normal   Breasts: Examined lying and sitting.     Right: Without masses, retractions, discharge or axillary adenopathy.     Left: Without masses, retractions, discharge or axillary adenopathy. Gentitourinary   Inguinal/mons:  Normal without inguinal adenopathy  External genitalia:  Normal  BUS/Urethra/Skene's glands:  Normal  Vagina:  Normal  Cervix:  Absent Uterus:  Absent  Adnexa/parametria:     Rt: Without masses or tenderness.   Lt: Without masses or tenderness.  Anus and perineum: Normal  Digital rectal exam: Normal sphincter tone without palpated masses or tenderness  Assessment/Plan:  [name] for annual exam.     2003 TAH with BSO for endometriosis no HRT Benign  thyroid nodule 09/2013/ normal TSH Elevated blood sugars followup at primary care Morbid obesity  Plan: Reviewed importance of increasing regular exercise and decreasing calories for weight loss and blood sugar control. Keep scheduled followup with primary care. SBE's, continue annual mammogram, calcium rich diet, vitamin D 2000 daily encouraged. Repeat DEXA next year. Encouraged condoms if sexually active.   [place], 9:53 AM 02/03/2014

## 2014-06-17 ENCOUNTER — Ambulatory Visit: Payer: BC Managed Care – PPO

## 2014-06-24 ENCOUNTER — Ambulatory Visit: Payer: BC Managed Care – PPO

## 2014-07-01 ENCOUNTER — Ambulatory Visit: Payer: BC Managed Care – PPO

## 2014-07-15 ENCOUNTER — Other Ambulatory Visit: Payer: Self-pay

## 2014-12-27 ENCOUNTER — Other Ambulatory Visit: Payer: Self-pay

## 2014-12-27 DIAGNOSIS — Z1231 Encounter for screening mammogram for malignant neoplasm of breast: Secondary | ICD-10-CM

## 2015-02-04 ENCOUNTER — Ambulatory Visit
Admission: RE | Admit: 2015-02-04 | Discharge: 2015-02-04 | Disposition: A | Discharge status: Home / Self Care | Payer: BLUE CROSS/BLUE SHIELD | Source: Ambulatory Visit

## 2015-02-04 DIAGNOSIS — Z1231 Encounter for screening mammogram for malignant neoplasm of breast: Secondary | ICD-10-CM

## 2015-02-14 ENCOUNTER — Ambulatory Visit (HOSPITAL_COMMUNITY): Admission: RE | Admit: 2015-02-14 | Payer: Self-pay | Source: Ambulatory Visit | Admitting: Gastroenterology

## 2015-02-14 ENCOUNTER — Encounter (HOSPITAL_COMMUNITY): Admission: RE | Payer: Self-pay | Source: Ambulatory Visit

## 2015-02-14 SURGERY — COLONOSCOPY WITH PROPOFOL
Anesthesia: Monitor Anesthesia Care

## 2015-02-16 ENCOUNTER — Ambulatory Visit (INDEPENDENT_AMBULATORY_CARE_PROVIDER_SITE_OTHER): Payer: BLUE CROSS/BLUE SHIELD | Admitting: Women's Health

## 2015-02-16 ENCOUNTER — Encounter: Payer: Self-pay | Admitting: Women's Health

## 2015-02-16 VITALS — BP 132/84 | Ht 63.0 in | Wt 313.0 lb

## 2015-02-16 DIAGNOSIS — B373 Candidiasis of vulva and vagina: Secondary | ICD-10-CM | POA: Diagnosis not present

## 2015-02-16 DIAGNOSIS — M8580 Other specified disorders of bone density and structure, unspecified site: Secondary | ICD-10-CM

## 2015-02-16 DIAGNOSIS — Z13820 Encounter for screening for osteoporosis: Secondary | ICD-10-CM

## 2015-02-16 DIAGNOSIS — Z01419 Encounter for gynecological examination (general) (routine) without abnormal findings: Secondary | ICD-10-CM | POA: Diagnosis not present

## 2015-02-16 DIAGNOSIS — B3731 Acute candidiasis of vulva and vagina: Secondary | ICD-10-CM

## 2015-02-16 LAB — WET PREP FOR TRICH, YEAST, CLUE
CLUE CELLS WET PREP: NONE SEEN
Trich, Wet Prep: NONE SEEN

## 2015-02-16 MED ORDER — NYSTATIN 100000 UNIT/GM EX CREA: TOPICAL_CREAM | Freq: Two times a day (BID) | CUTANEOUS | Status: DC

## 2015-02-16 MED ORDER — FLUCONAZOLE 150 MG PO TABS: 150.0000 mg | ORAL_TABLET | Freq: Once | ORAL | Status: DC

## 2015-02-16 NOTE — Patient Instructions (Signed)
Diabetes Mellitus and Food It is important for you to manage your blood sugar (glucose) level. Your blood glucose level can be greatly affected by what you eat. Eating healthier foods in the appropriate amounts throughout the day at about the same time each day will help you control your blood glucose level. It can also help slow or prevent worsening of your diabetes mellitus. Healthy eating may even help you improve the level of your blood pressure and reach or maintain a healthy weight.  HOW CAN FOOD AFFECT ME? Carbohydrates Carbohydrates affect your blood glucose level more than any other type of food. Your dietitian will help you determine how many carbohydrates to eat at each meal and teach you how to count carbohydrates. Counting carbohydrates is important to keep your blood glucose at a healthy level, especially if you are using insulin or taking certain medicines for diabetes mellitus. Alcohol Alcohol can cause sudden decreases in blood glucose (hypoglycemia), especially if you use insulin or take certain medicines for diabetes mellitus. Hypoglycemia can be a life-threatening condition. Symptoms of hypoglycemia (sleepiness, dizziness, and disorientation) are similar to symptoms of having too much alcohol.  If your health care provider has given you approval to drink alcohol, do so in moderation and use the following guidelines:  Women should not have more than one drink per day, and men should not have more than two drinks per day. One drink is equal to:  12 oz of beer.  5 oz of wine.  1 oz of hard liquor.  Do not drink on an empty stomach.  Keep yourself hydrated. Have water, diet soda, or unsweetened iced tea.  Regular soda, juice, and other mixers might contain a lot of carbohydrates and should be counted. WHAT FOODS ARE NOT RECOMMENDED? As you make food choices, it is important to remember that all foods are not the same. Some foods have fewer nutrients per serving than other  foods, even though they might have the same number of calories or carbohydrates. It is difficult to get your body what it needs when you eat foods with fewer nutrients. Examples of foods that you should avoid that are high in calories and carbohydrates but low in nutrients include:  Trans fats (most processed foods list trans fats on the Nutrition Facts label).  Regular soda.  Juice.  Candy.  Sweets, such as cake, pie, doughnuts, and cookies.  Fried foods. WHAT FOODS CAN I EAT? Have nutrient-rich foods, which will nourish your body and keep you healthy. The food you should eat also will depend on several factors, including:  The calories you need.  The medicines you take.  Your weight.  Your blood glucose level.  Your blood pressure level.  Your cholesterol level. You also should eat a variety of foods, including:  Protein, such as meat, poultry, fish, tofu, nuts, and seeds (lean animal proteins are best).  Fruits.  Vegetables.  Dairy products, such as milk, cheese, and yogurt (low fat is best).  Breads, grains, pasta, cereal, rice, and beans.  Fats such as olive oil, trans fat-free margarine, canola oil, avocado, and olives. DOES EVERYONE WITH DIABETES MELLITUS HAVE THE SAME MEAL PLAN? Because every person with diabetes mellitus is different, there is not one meal plan that works for everyone. It is very important that you meet with a dietitian who will help you create a meal plan that is just right for you. Document Released: 06/07/2005 Document Revised: 09/15/2013 Document Reviewed: 08/07/2013 [employer] Patient Information 2015 [employer]. This   information is not intended to replace advice given to you by your health care provider. Make sure you discuss any questions you have with your health care provider. Cardiac Diet This diet can help prevent heart disease and stroke. Many factors influence your heart health, including eating and exercise habits. Coronary risk  rises a lot with abnormal blood fat (lipid) levels. Cardiac meal planning includes limiting unhealthy fats, increasing healthy fats, and making other small dietary changes. General guidelines are as follows:  Adjust calorie intake to reach and maintain desirable body weight.  Limit total fat intake to less than 30% of total calories. Saturated fat should be less than 7% of calories.  Saturated fats are found in animal products and in some vegetable products. Saturated vegetable fats are found in coconut oil, cocoa butter, palm oil, and palm kernel oil. Read labels carefully to avoid these products as much as possible. Use butter in moderation. Choose tub margarines and oils that have 2 grams of fat or less. Good cooking oils are canola and olive oils.  Practice low-fat cooking techniques. Do not fry food. Instead, broil, bake, boil, steam, grill, roast on a rack, stir-fry, or microwave it. Other fat reducing suggestions include:  Remove the skin from poultry.  Remove all visible fat from meats.  Skim the fat off stews, soups, and gravies before serving them.  Steam vegetables in water or broth instead of sauting them in fat.  Avoid foods with trans fat (or hydrogenated oils), such as commercially fried foods and commercially baked goods. Commercial shortening and deep-frying fats will contain trans fat.  Increase intake of fruits, vegetables, whole grains, and legumes to replace foods high in fat.  Increase consumption of nuts, legumes, and seeds to at least 4 servings weekly. One serving of a legume equals  cup, and 1 serving of nuts or seeds equals  cup.  Choose whole grains more often. Have 3 servings per day (a serving is 1 ounce [oz]).  Eat 4 to 5 servings of vegetables per day. A serving of vegetables is 1 cup of raw leafy vegetables;  cup of raw or cooked cut-up vegetables;  cup of vegetable juice.  Eat 4 to 5 servings of fruit per day. A serving of fruit is 1 medium whole  fruit;  cup of dried fruit;  cup of fresh, frozen, or canned fruit;  cup of 100% fruit juice.  Increase your intake of dietary fiber to 20 to 30 grams per day. Insoluble fiber may help lower your risk of heart disease and may help curb your appetite.  Soluble fiber binds cholesterol to be removed from the blood. Foods high in soluble fiber are dried beans, citrus fruits, oats, apples, bananas, broccoli, Brussels sprouts, and eggplant.  Try to include foods fortified with plant sterols or stanols, such as yogurt, breads, juices, or margarines. Choose several fortified foods to achieve a daily intake of 2 to 3 grams of plant sterols or stanols.  Foods with omega-3 fats can help reduce your risk of heart disease. Aim to have a 3.5 oz portion of fatty fish twice per week, such as salmon, mackerel, albacore tuna, sardines, lake trout, or herring. If you wish to take a fish oil supplement, choose one that contains 1 gram of both DHA and EPA.  Limit processed meats to 2 servings (3 oz portion) weekly.  Limit the sodium in your diet to 1500 milligrams (mg) per day. If you have high blood pressure, talk to a registered dietitian about  a DASH (Dietary Approaches to Stop Hypertension) eating plan.  Limit sweets and beverages with added sugar, such as soda, to no more than 5 servings per week. One serving is:   1 tablespoon sugar.  1 tablespoon jelly or jam.   cup sorbet.  1 cup lemonade.   cup regular soda. CHOOSING FOODS Starches  Allowed: Breads: All kinds (wheat, rye, raisin, white, oatmeal, [place], [place], and English muffin bread). Low-fat rolls: English muffins, frankfurter and hamburger buns, bagels, pita bread, tortillas (not fried). Pancakes, waffles, biscuits, and muffins made with recommended oil.  Avoid: Products made with saturated or trans fats, oils, or whole milk products. Butter rolls, cheese breads, croissants. Commercial doughnuts, muffins, sweet rolls, biscuits, waffles,  pancakes, store-bought mixes. Crackers  Allowed: Low-fat crackers and snacks: Animal, graham, rye, saltine (with recommended oil, no lard), oyster, and matzo crackers. Bread sticks, melba toast, rusks, flatbread, pretzels, and light popcorn.  Avoid: High-fat crackers: cheese crackers, butter crackers, and those made with coconut, palm oil, or trans fat (hydrogenated oils). Buttered popcorn. Cereals  Allowed: Hot or cold whole-grain cereals.  Avoid: Cereals containing coconut, hydrogenated vegetable fat, or animal fat. Potatoes / Pasta / Rice  Allowed: All kinds of potatoes, rice, and pasta (such as macaroni, spaghetti, and noodles).  Avoid: Pasta or rice prepared with cream sauce or high-fat cheese. Chow mein noodles, [place] fries. Vegetables  Allowed: All vegetables and vegetable juices.  Avoid: Fried vegetables. Vegetables in cream, butter, or high-fat cheese sauces. Limit coconut. Fruit in cream or custard. Protein  Allowed: Limit your intake of meat, seafood, and poultry to no more than 6 oz (cooked weight) per day. All lean, well-trimmed beef, veal, pork, and lamb. All chicken and [place] without skin. All fish and shellfish. Wild game: wild duck, rabbit, pheasant, and venison. Egg whites or low-cholesterol egg substitutes may be used as desired. Meatless dishes: recipes with dried beans, peas, lentils, and tofu (soybean curd). Seeds and nuts: all seeds and most nuts.  Avoid: Prime grade and other heavily marbled and fatty meats, such as short ribs, spare ribs, rib eye roast or steak, frankfurters, sausage, bacon, and high-fat luncheon meats, mutton. Caviar. Commercially fried fish. Domestic duck, goose, venison sausage. Organ meats: liver, gizzard, heart, chitterlings, brains, kidney, sweetbreads. Dairy  Allowed: Low-fat cheeses: nonfat or low-fat cottage cheese (1% or 2% fat), cheeses made with part skim milk, such as mozzarella, farmers, string, or ricotta. (Cheeses should be  labeled no more than 2 to 6 grams fat per oz.). Skim (or 1%) milk: liquid, powdered, or evaporated. Buttermilk made with low-fat milk. Drinks made with skim or low-fat milk or cocoa. Chocolate milk or cocoa made with skim or low-fat (1%) milk. Nonfat or low-fat yogurt.  Avoid: Whole milk cheeses, including colby, cheddar, [place], Monterey Jack, [place], [place], [place], American, Swiss, and blue. Creamed cottage cheese, cream cheese. Whole milk and whole milk products, including buttermilk or yogurt made from whole milk, drinks made from whole milk. Condensed milk, evaporated whole milk, and 2% milk. Soups and Combination Foods  Allowed: Low-fat low-sodium soups: broth, dehydrated soups, homemade broth, soups with the fat removed, homemade cream soups made with skim or low-fat milk. Low-fat spaghetti, lasagna, chili, and Spanish rice if low-fat ingredients and low-fat cooking techniques are used.  Avoid: Cream soups made with whole milk, cream, or high-fat cheese. All other soups. Desserts and Sweets  Allowed: Sherbet, fruit ices, gelatins, meringues, and angel food cake. Homemade desserts with recommended fats, oils, and milk products. Cathren Laine,  jelly, honey, marmalade, sugars, and syrups. Pure sugar candy, such as gum drops, hard candy, jelly beans, marshmallows, mints, and small amounts of dark chocolate.  Avoid: Commercially prepared cakes, pies, cookies, frosting, pudding, or mixes for these products. Desserts containing whole milk products, chocolate, coconut, lard, palm oil, or palm kernel oil. Ice cream or ice cream drinks. Candy that contains chocolate, coconut, butter, hydrogenated fat, or unknown ingredients. Buttered syrups. Fats and Oils  Allowed: Vegetable oils: safflower, sunflower, corn, soybean, cottonseed, sesame, canola, olive, or peanut. Non-hydrogenated margarines. Salad dressing or mayonnaise: homemade or commercial, made with a recommended oil. Low or nonfat salad dressing or  mayonnaise.  Limit added fats and oils to 6 to 8 tsp per day (includes fats used in cooking, baking, salads, and spreads on bread). Remember to count the "hidden fats" in foods.  Avoid: Solid fats and shortenings: butter, lard, salt pork, bacon drippings. Gravy containing meat fat, shortening, or suet. Cocoa butter, coconut. Coconut oil, palm oil, palm kernel oil, or hydrogenated oils: these ingredients are often used in bakery products, nondairy creamers, whipped toppings, candy, and commercially fried foods. Read labels carefully. Salad dressings made of unknown oils, sour cream, or cheese, such as blue cheese and Roquefort. Cream, all kinds: half-and-half, light, heavy, or whipping. Sour cream or cream cheese (even if "light" or low-fat). Nondairy cream substitutes: coffee creamers and sour cream substitutes made with palm, palm kernel, hydrogenated oils, or coconut oil. Beverages  Allowed: Coffee (regular or decaffeinated), tea. Diet carbonated beverages, mineral water. Alcohol: Check with your caregiver. Moderation is recommended.  Avoid: Whole milk, regular sodas, and juice drinks with added sugar. Condiments  Allowed: All seasonings and condiments. Cocoa powder. "Cream" sauces made with recommended ingredients.  Avoid: Carob powder made with hydrogenated fats. SAMPLE MENU Breakfast   cup orange juice   cup oatmeal  1 slice toast  1 tsp margarine  1 cup skim milk Lunch  [place] sandwich with 2 oz [place], 2 slices bread  Lettuce and tomato slices  Fresh fruit  Carrot sticks  Coffee or tea Snack  Fresh fruit or low-fat crackers Dinner  3 oz lean ground beef  1 baked potato  1 tsp margarine   cup asparagus  Lettuce salad  1 tbs non-creamy dressing   cup peach slices  1 cup skim milk Document Released: 06/19/2008 Document Revised: 03/11/2012 Document Reviewed: 11/10/2013 Exit[employer] Patient Information 2015 [place], [place]. This information is not intended  to replace advice given to you by your health care provider. Make sure you discuss any questions you have with your health care provider. Exercise to Stay Healthy Exercise helps you become and stay healthy. EXERCISE IDEAS AND TIPS Choose exercises that:  You enjoy.  Fit into your day. You do not need to exercise really hard to be healthy. You can do exercises at a slow or medium level and stay healthy. You can:  Stretch before and after working out.  Try yoga, Pilates, or tai chi.  Lift weights.  Walk fast, swim, jog, run, climb stairs, bicycle, dance, or rollerskate.  Take aerobic classes. Exercises that burn about 150 calories:  Running 1  miles in 15 minutes.  Playing volleyball for 45 to 60 minutes.  Washing and waxing a car for 45 to 60 minutes.  Playing touch football for 45 minutes.  Walking 1  miles in 35 minutes.  Pushing a stroller 1  miles in 30 minutes.  Playing basketball for 30 minutes.  Raking leaves for 30 minutes.  Bicycling 5 miles in 30 minutes.  Walking 2 miles in 30 minutes.  Dancing for 30 minutes.  Shoveling snow for 15 minutes.  Swimming laps for 20 minutes.  Walking up stairs for 15 minutes.  Bicycling 4 miles in 15 minutes.  Gardening for 30 to 45 minutes.  Jumping rope for 15 minutes.  Washing windows or floors for 45 to 60 minutes. Document Released: 10/13/2010 Document Revised: 12/03/2011 Document Reviewed: 10/13/2010 [facility] Patient Information 2015 [place], [place]. This information is not intended to replace advice given to you by your health care provider. Make sure you discuss any questions you have with your health care provider.

## 2015-02-16 NOTE — Progress Notes (Signed)
Marissa [name] 08-30-1966 [id number]    History:    Presents for annual exam.  2003 TAH with BSO for endometriosis. 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 History of normal Paps and mammograms. 2013 benign colon polyp was scheduled for repeat colonoscopy unable to afford due to deductible. 2012 DEXA T score -1 at the femoral neck. Primary care manages labs has had an elevated hemoglobin A1c. Benign thyroid nodule removed 09/2013. Morbid obesity.  Past medical history, past surgical history, family history and social history were all reviewed and documented in the EPIC chart. Unemployed. Sister currently living with her helping with expenses. Father stroke helped care for him for 2 years prior to his death. Parents diabetes.  ROS:  A ROS was performed and pertinent positives and negatives are included.  Exam:  Filed Vitals:   02/16/15 0815  BP: 132/84    General appearance:  Normal Thyroid:  Symmetrical, normal in size, without palpable masses or nodularity. Respiratory  Auscultation:  Clear without wheezing or rhonchi Cardiovascular  Auscultation:  Regular rate, without rubs, murmurs or gallops  Edema/varicosities:  Not grossly evident Abdominal  Soft,nontender, without masses, guarding or rebound.  Liver/spleen:  No organomegaly noted  Hernia:  None appreciated  Skin  Inspection:  Grossly normal   Breasts: Examined lying and sitting.     Right: Without masses, retractions, discharge or axillary adenopathy.     Left: Without masses, retractions, discharge or axillary adenopathy. Gentitourinary   Inguinal/mons:  Normal without inguinal adenopathy  External genitalia:  Normal  BUS/Urethra/Skene's glands:  Normal  Vagina:  Normal  Cervix:  Absent  Uterus:  Absent  Adnexa/parametria:     Rt: Without masses or tenderness.   Lt: Without masses or tenderness.  Anus and perineum: Normal  Digital rectal exam: Normal sphincter tone without palpated masses or tenderness  Assessment/Plan:  49 y.o.  S WF G0 for annual exam with complaint of vaginal itching at times.  TAH with BSO on no HRT/not sexually active 1979  ALL remission Osteopenia Labs primary care has follow-up scheduled for an elevated hemoglobin A1c Morbid obesity  Plan: Condoms encouraged if sexually active. SBE's, continue annual screening mammogram, calcium rich diet, vitamin D 1000 daily encouraged. Reviewed importance of increasing regular exercise, decreasing calories for weight loss. Repeat DEXA. Home safety, fall prevention discussed. [name], 8:53 AM 02/16/2015

## 2015-02-17 LAB — URINALYSIS W MICROSCOPIC + REFLEX CULTURE
BILIRUBIN URINE: NEGATIVE
Bacteria, UA: NONE SEEN
Casts: NONE SEEN
Glucose, UA: NEGATIVE mg/dL
HGB URINE DIPSTICK: NEGATIVE
Ketones, ur: NEGATIVE mg/dL
Leukocytes, UA: NEGATIVE
Nitrite: NEGATIVE
Protein, ur: NEGATIVE mg/dL
Specific Gravity, Urine: 1.015 (ref 1.005–1.030)
UROBILINOGEN UA: 0.2 mg/dL (ref 0.0–1.0)
pH: 5 (ref 5.0–8.0)

## 2015-07-20 ENCOUNTER — Other Ambulatory Visit: Payer: Self-pay | Admitting: Orthopedic Surgery

## 2015-07-20 DIAGNOSIS — M25511 Pain in right shoulder: Secondary | ICD-10-CM

## 2015-08-06 ENCOUNTER — Other Ambulatory Visit: Payer: BLUE CROSS/BLUE SHIELD

## 2015-09-27 ENCOUNTER — Ambulatory Visit
Admission: RE | Admit: 2015-09-27 | Discharge: 2015-09-27 | Disposition: A | Discharge status: Home / Self Care | Payer: BLUE CROSS/BLUE SHIELD | Source: Ambulatory Visit | Attending: Orthopedic Surgery | Admitting: Orthopedic Surgery

## 2015-09-27 DIAGNOSIS — M25511 Pain in right shoulder: Secondary | ICD-10-CM

## 2015-09-29 ENCOUNTER — Other Ambulatory Visit: Payer: Self-pay | Admitting: Orthopedic Surgery

## 2015-09-29 DIAGNOSIS — R2230 Localized swelling, mass and lump, unspecified upper limb: Secondary | ICD-10-CM

## 2015-10-04 ENCOUNTER — Ambulatory Visit
Admission: RE | Admit: 2015-10-04 | Discharge: 2015-10-04 | Disposition: A | Discharge status: Home / Self Care | Payer: BLUE CROSS/BLUE SHIELD | Source: Ambulatory Visit | Attending: Orthopedic Surgery | Admitting: Orthopedic Surgery

## 2015-10-04 DIAGNOSIS — R2230 Localized swelling, mass and lump, unspecified upper limb: Secondary | ICD-10-CM

## 2015-10-04 MED ORDER — IOPAMIDOL (ISOVUE-300) INJECTION 61%
100.0000 mL | Freq: Once | INTRAVENOUS | Status: AC | PRN
  Administered 2015-10-04: 100 mL via INTRAVENOUS

## 2016-01-17 ENCOUNTER — Other Ambulatory Visit: Payer: Self-pay

## 2016-01-17 DIAGNOSIS — Z1231 Encounter for screening mammogram for malignant neoplasm of breast: Secondary | ICD-10-CM

## 2016-02-06 ENCOUNTER — Ambulatory Visit
Admission: RE | Admit: 2016-02-06 | Discharge: 2016-02-06 | Disposition: A | Discharge status: Home / Self Care | Payer: BLUE CROSS/BLUE SHIELD | Source: Ambulatory Visit

## 2016-02-06 DIAGNOSIS — Z1231 Encounter for screening mammogram for malignant neoplasm of breast: Secondary | ICD-10-CM

## 2016-03-06 ENCOUNTER — Encounter: Payer: Self-pay | Admitting: Women's Health

## 2016-03-06 ENCOUNTER — Ambulatory Visit (INDEPENDENT_AMBULATORY_CARE_PROVIDER_SITE_OTHER): Payer: BLUE CROSS/BLUE SHIELD | Admitting: Women's Health

## 2016-03-06 VITALS — BP 130/80 | Ht 63.0 in | Wt 329.0 lb

## 2016-03-06 DIAGNOSIS — Z01419 Encounter for gynecological examination (general) (routine) without abnormal findings: Secondary | ICD-10-CM | POA: Diagnosis not present

## 2016-03-06 DIAGNOSIS — B373 Candidiasis of vulva and vagina: Secondary | ICD-10-CM

## 2016-03-06 DIAGNOSIS — B3731 Acute candidiasis of vulva and vagina: Secondary | ICD-10-CM

## 2016-03-06 MED ORDER — NYSTATIN-TRIAMCINOLONE 100000-0.1 UNIT/GM-% EX OINT: 1.0000 "application " | TOPICAL_OINTMENT | Freq: Two times a day (BID) | CUTANEOUS | Status: DC

## 2016-03-06 MED ORDER — NYSTATIN 100000 UNIT/GM EX POWD: Freq: Four times a day (QID) | CUTANEOUS | Status: DC

## 2016-03-06 NOTE — Patient Instructions (Signed)
Monilial Vaginitis Vaginitis in a soreness, swelling and redness (inflammation) of the vagina and vulva. Monilial vaginitis is not a sexually transmitted infection. CAUSES  Yeast vaginitis is caused by yeast (candida) that is normally found in your vagina. With a yeast infection, the candida has overgrown in number to a point that upsets the chemical balance. SYMPTOMS   White, thick vaginal discharge.  Swelling, itching, redness and irritation of the vagina and possibly the lips of the vagina (vulva).  Burning or painful urination.  Painful intercourse. DIAGNOSIS  Things that may contribute to monilial vaginitis are:  Postmenopausal and virginal states.  Pregnancy.  Infections.  Being tired, sick or stressed, especially if you had monilial vaginitis in the past.  Diabetes. Good control will help lower the chance.  Birth control pills.  Tight fitting garments.  Using bubble bath, feminine sprays, douches or deodorant tampons.  Taking certain medications that kill germs (antibiotics).  Sporadic recurrence can occur if you become ill. TREATMENT  Your caregiver will give you medication.  There are several kinds of anti monilial vaginal creams and suppositories specific for monilial vaginitis. For recurrent yeast infections, use a suppository or cream in the vagina 2 times a week, or as directed.  Anti-monilial or steroid cream for the itching or irritation of the vulva may also be used. Get your caregiver's permission.  Painting the vagina with methylene blue solution may help if the monilial cream does not work.  Eating yogurt may help prevent monilial vaginitis. HOME CARE INSTRUCTIONS   Finish all medication as prescribed.  Do not have sex until treatment is completed or after your caregiver tells you it is okay.  Take warm sitz baths.  Do not douche.  Do not use tampons, especially scented ones.  Wear cotton underwear.  Avoid tight pants and panty  hose.  Tell your sexual partner that you have a yeast infection. They should go to their caregiver if they have symptoms such as mild rash or itching.  Your sexual partner should be treated as well if your infection is difficult to eliminate.  Practice safer sex. Use condoms.  Some vaginal medications cause latex condoms to fail. Vaginal medications that harm condoms are:  Cleocin cream.  Butoconazole (Femstat).  Terconazole (Terazol) vaginal suppository.  Miconazole (Monistat) (may be purchased over the counter). SEEK MEDICAL CARE IF:   You have a temperature by mouth above 102 F (38.9 C).  The infection is getting worse after 2 days of treatment.  The infection is not getting better after 3 days of treatment.  You develop blisters in or around your vagina.  You develop vaginal bleeding, and it is not your menstrual period.  You have pain when you urinate.  You develop intestinal problems.  You have pain with sexual intercourse.   This information is not intended to replace advice given to you by your health care provider. Make sure you discuss any questions you have with your health care provider.   Document Released: 06/20/2005 Document Revised: 12/03/2011 Document Reviewed: 03/14/2015 Elsevier Interactive Patient Education 2016 [employer] Menopause is a normal process in which your reproductive ability comes to an end. This process happens gradually over a span of months to years, usually between the ages of 48 and 55. Menopause is complete when you have missed 12 consecutive menstrual periods. It is important to talk with your health care provider about some of the most common conditions that affect postmenopausal women, such as heart disease, cancer, and bone loss (osteoporosis).   Adopting a healthy lifestyle and getting preventive care can help to promote your health and wellness. Those actions can also lower your chances of developing some of these common  conditions. WHAT SHOULD I KNOW ABOUT MENOPAUSE? During menopause, you may experience a number of symptoms, such as:  Moderate-to-severe hot flashes.  Night sweats.  Decrease in sex drive.  Mood swings.  Headaches.  Tiredness.  Irritability.  Memory problems.  Insomnia. Choosing to treat or not to treat menopausal changes is an individual decision that you make with your health care provider. WHAT SHOULD I KNOW ABOUT HORMONE REPLACEMENT THERAPY AND SUPPLEMENTS? Hormone therapy products are effective for treating symptoms that are associated with menopause, such as hot flashes and night sweats. Hormone replacement carries certain risks, especially as you become older. If you are thinking about using estrogen or estrogen with progestin treatments, discuss the benefits and risks with your health care provider. WHAT SHOULD I KNOW ABOUT HEART DISEASE AND STROKE? Heart disease, heart attack, and stroke become more likely as you age. This may be due, in part, to the hormonal changes that your body experiences during menopause. These can affect how your body processes dietary fats, triglycerides, and cholesterol. Heart attack and stroke are both medical emergencies. There are many things that you can do to help prevent heart disease and stroke:  Have your blood pressure checked at least every 1-2 years. High blood pressure causes heart disease and increases the risk of stroke.  If you are 40-57 years old, ask your health care provider if you should take aspirin to prevent a heart attack or a stroke.  Do not use any tobacco products, including cigarettes, chewing tobacco, or electronic cigarettes. If you need help quitting, ask your health care provider.  It is important to eat a healthy diet and maintain a healthy weight.  Be sure to include plenty of vegetables, fruits, low-fat dairy products, and lean protein.  Avoid eating foods that are high in solid fats, added sugars, or salt  (sodium).  Get regular exercise. This is one of the most important things that you can do for your health.  Try to exercise for at least 150 minutes each week. The type of exercise that you do should increase your heart rate and make you sweat. This is known as moderate-intensity exercise.  Try to do strengthening exercises at least twice each week. Do these in addition to the moderate-intensity exercise.  Know your numbers.Ask your health care provider to check your cholesterol and your blood glucose. Continue to have your blood tested as directed by your health care provider. WHAT SHOULD I KNOW ABOUT CANCER SCREENING? There are several types of cancer. Take the following steps to reduce your risk and to catch any cancer development as early as possible. Breast Cancer  Practice breast self-awareness.  This means understanding how your breasts normally appear and feel.  It also means doing regular breast self-exams. Let your health care provider know about any changes, no matter how small.  If you are 78 or older, have a clinician do a breast exam (clinical breast exam or CBE) every year. Depending on your age, family history, and medical history, it may be recommended that you also have a yearly breast X-ray (mammogram).  If you have a family history of breast cancer, talk with your health care provider about genetic screening.  If you are at high risk for breast cancer, talk with your health care provider about having an MRI and a mammogram  every year.  Breast cancer (BRCA) gene test is recommended for women who have family members with BRCA-related cancers. Results of the assessment will determine the need for genetic counseling and BRCA1 and for BRCA2 testing. BRCA-related cancers include these types:  Breast. This occurs in males or females.  Ovarian.  Tubal. This may also be called fallopian tube cancer.  Cancer of the abdominal or pelvic lining (peritoneal  cancer).  Prostate.  Pancreatic. Cervical, Uterine, and Ovarian Cancer Your health care provider may recommend that you be screened regularly for cancer of the pelvic organs. These include your ovaries, uterus, and vagina. This screening involves a pelvic exam, which includes checking for microscopic changes to the surface of your cervix (Pap test).  For women ages 21-65, health care providers may recommend a pelvic exam and a Pap test every three years. For women ages 30-65, they may recommend the Pap test and pelvic exam, combined with testing for human papilloma virus (HPV), every five years. Some types of HPV increase your risk of cervical cancer. Testing for HPV may also be done on women of any age who have unclear Pap test results.  Other health care providers may not recommend any screening for nonpregnant women who are considered low risk for pelvic cancer and have no symptoms. Ask your health care provider if a screening pelvic exam is right for you.  If you have had past treatment for cervical cancer or a condition that could lead to cancer, you need Pap tests and screening for cancer for at least 20 years after your treatment. If Pap tests have been discontinued for you, your risk factors (such as having a new sexual partner) need to be reassessed to determine if you should start having screenings again. Some women have medical problems that increase the chance of getting cervical cancer. In these cases, your health care provider may recommend that you have screening and Pap tests more often.  If you have a family history of uterine cancer or ovarian cancer, talk with your health care provider about genetic screening.  If you have vaginal bleeding after reaching menopause, tell your health care provider.  There are currently no reliable tests available to screen for ovarian cancer. Lung Cancer Lung cancer screening is recommended for adults 55-80 years old who are at high risk for lung  cancer because of a history of smoking. A yearly low-dose CT scan of the lungs is recommended if you:  Currently smoke.  Have a history of at least 30 pack-years of smoking and you currently smoke or have quit within the past 15 years. A pack-year is smoking an average of one pack of cigarettes per day for one year. Yearly screening should:  Continue until it has been 15 years since you quit.  Stop if you develop a health problem that would prevent you from having lung cancer treatment. Colorectal Cancer  This type of cancer can be detected and can often be prevented.  Routine colorectal cancer screening usually begins at age 50 and continues through age 75.  If you have risk factors for colon cancer, your health care provider may recommend that you be screened at an earlier age.  If you have a family history of colorectal cancer, talk with your health care provider about genetic screening.  Your health care provider may also recommend using home test kits to check for hidden blood in your stool.  A small camera at the end of a tube can be used to examine   your colon directly (sigmoidoscopy or colonoscopy). This is done to check for the earliest forms of colorectal cancer.  Direct examination of the colon should be repeated every 5-10 years until age 75. However, if early forms of precancerous polyps or small growths are found or if you have a family history or genetic risk for colorectal cancer, you may need to be screened more often. Skin Cancer  Check your skin from head to toe regularly.  Monitor any moles. Be sure to tell your health care provider:  About any new moles or changes in moles, especially if there is a change in a mole's shape or color.  If you have a mole that is larger than the size of a pencil eraser.  If any of your family members has a history of skin cancer, especially at a Zyanya Glaza age, talk with your health care provider about genetic screening.  Always use  sunscreen. Apply sunscreen liberally and repeatedly throughout the day.  Whenever you are outside, protect yourself by wearing long sleeves, pants, a wide-brimmed hat, and sunglasses. WHAT SHOULD I KNOW ABOUT OSTEOPOROSIS? Osteoporosis is a condition in which bone destruction happens more quickly than new bone creation. After menopause, you may be at an increased risk for osteoporosis. To help prevent osteoporosis or the bone fractures that can happen because of osteoporosis, the following is recommended:  If you are 19-50 years old, get at least 1,000 mg of calcium and at least 600 mg of vitamin D per day.  If you are older than age 50 but younger than age 70, get at least 1,200 mg of calcium and at least 600 mg of vitamin D per day.  If you are older than age 70, get at least 1,200 mg of calcium and at least 800 mg of vitamin D per day. Smoking and excessive alcohol intake increase the risk of osteoporosis. Eat foods that are rich in calcium and vitamin D, and do weight-bearing exercises several times each week as directed by your health care provider. WHAT SHOULD I KNOW ABOUT HOW MENOPAUSE AFFECTS MY MENTAL HEALTH? Depression may occur at any age, but it is more common as you become older. Common symptoms of depression include:  Low or sad mood.  Changes in sleep patterns.  Changes in appetite or eating patterns.  Feeling an overall lack of motivation or enjoyment of activities that you previously enjoyed.  Frequent crying spells. Talk with your health care provider if you think that you are experiencing depression. WHAT SHOULD I KNOW ABOUT IMMUNIZATIONS? It is important that you get and maintain your immunizations. These include:  Tetanus, diphtheria, and pertussis (Tdap) booster vaccine.  Influenza every year before the flu season begins.  Pneumonia vaccine.  Shingles vaccine. Your health care provider may also recommend other immunizations.   This information is not  intended to replace advice given to you by your health care provider. Make sure you discuss any questions you have with your health care provider.   Document Released: 11/02/2005 Document Revised: 10/01/2014 Document Reviewed: 05/13/2014 Elsevier Interactive Patient Education 2016 [employer] Inc.  

## 2016-03-06 NOTE — Progress Notes (Signed)
Marissa [name] 1965-10-27 [id number]    History:    Presents for annual exam.  2003 TAH with BSO for endometriosis. Normal Pap and mammogram history. Not sexually active for years. 2012 normal DEXA. 2012  2 benign colon polyps. Labs at primary care. 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  Past medical history, past surgical history, family history and social history were all reviewed and documented in the EPIC chart. Unemployed. States having a difficult time finding employment due to joint pain and physical limitations. Parents type 2 diabetes  ROS:  A ROS was performed and pertinent positives and negatives are included.  Exam:  Filed Vitals:   03/06/16 1048  BP: 130/80    General appearance:  Normal Thyroid:  Symmetrical, normal in size, without palpable masses or nodularity. Respiratory  Auscultation:  Clear without wheezing or rhonchi Cardiovascular  Auscultation:  Regular rate, without rubs, murmurs or gallops  Edema/varicosities:  Not grossly evident Abdominal Obese/large pannus  Soft,nontender, without masses, guarding or rebound.  Liver/spleen:  No organomegaly noted  Hernia:  None appreciated  Skin  Inspection:  Grossly normal   Breasts: Examined lying and sitting.     Right: Without masses, retractions, discharge or axillary adenopathy.     Left: Without masses, retractions, discharge or axillary adenopathy. Gentitourinary   Inguinal/mons:  Normal without inguinal adenopathy  External genitalia:  Resolving folliculitis on right lower labia  BUS/Urethra/Skene's glands:  Normal  Vagina:  Normal  Cervix:  And uterus absent  Rt: Without masses or tenderness.   Lt: Without masses or tenderness.  Anus and perineum: Normal  Digital rectal exam: Normal sphincter tone without palpated masses or tenderness  Assessment/Plan:  50 y.o. S WF G0 for annual exam with complaint of a sore bump on perineum that is resolving.    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 Nystatin powder to lower breasts and pannus when necessary. Mycolog ointment to occasional rashes at pannus. Follow-up colonoscopy reviewed, declines states unable to afford. Instructed to call gastroenterologist if any changes, blood in stool. SBE's, continue annual screening mammogram, calcium rich diet, vitamin D 2000 daily encouraged. Repeat DEXA, home safety, fall prevention and importance of weightbearing exercise reviewed. Strongly encouraged daily brisk walk. Reviewed importance of decreasing calories, weight loss surgery reviewed. UAHuel Cote WHNP, 2:07 PM 03/06/2016

## 2016-05-15 ENCOUNTER — Other Ambulatory Visit: Payer: Self-pay | Admitting: Family Medicine

## 2016-05-15 DIAGNOSIS — E041 Nontoxic single thyroid nodule: Secondary | ICD-10-CM

## 2016-05-15 DIAGNOSIS — E039 Hypothyroidism, unspecified: Secondary | ICD-10-CM

## 2016-05-15 DIAGNOSIS — R221 Localized swelling, mass and lump, neck: Secondary | ICD-10-CM

## 2016-05-18 ENCOUNTER — Ambulatory Visit
Admission: RE | Admit: 2016-05-18 | Discharge: 2016-05-18 | Disposition: A | Discharge status: Home / Self Care | Payer: BLUE CROSS/BLUE SHIELD | Source: Ambulatory Visit | Attending: Family Medicine | Admitting: Family Medicine

## 2016-05-18 DIAGNOSIS — E039 Hypothyroidism, unspecified: Secondary | ICD-10-CM

## 2016-05-18 DIAGNOSIS — E041 Nontoxic single thyroid nodule: Secondary | ICD-10-CM

## 2016-05-18 DIAGNOSIS — R221 Localized swelling, mass and lump, neck: Secondary | ICD-10-CM

## 2016-08-06 ENCOUNTER — Encounter: Payer: Self-pay | Admitting: Gynecology

## 2016-08-06 ENCOUNTER — Ambulatory Visit (INDEPENDENT_AMBULATORY_CARE_PROVIDER_SITE_OTHER): Payer: BLUE CROSS/BLUE SHIELD | Admitting: Gynecology

## 2016-08-06 VITALS — BP 130/86

## 2016-08-06 DIAGNOSIS — N3091 Cystitis, unspecified with hematuria: Secondary | ICD-10-CM | POA: Diagnosis not present

## 2016-08-06 DIAGNOSIS — R300 Dysuria: Secondary | ICD-10-CM

## 2016-08-06 DIAGNOSIS — R350 Frequency of micturition: Secondary | ICD-10-CM | POA: Diagnosis not present

## 2016-08-06 LAB — URINALYSIS W MICROSCOPIC + REFLEX CULTURE
Bilirubin Urine: NEGATIVE
Casts: NONE SEEN [LPF]
Crystals: NONE SEEN [HPF]
Ketones, ur: NEGATIVE
NITRITE: POSITIVE — AB
PH: 5.5 (ref 5.0–8.0)
YEAST: NONE SEEN [HPF]

## 2016-08-06 MED ORDER — CIPROFLOXACIN HCL 250 MG PO TABS
250.0000 mg | ORAL_TABLET | Freq: Two times a day (BID) | ORAL | 0 refills | Status: DC
Start: 2016-08-06 — End: 2016-11-08

## 2016-08-06 MED ORDER — FLUCONAZOLE 150 MG PO TABS: 150.0000 mg | ORAL_TABLET | Freq: Once | ORAL | 0 refills | Status: AC

## 2016-08-06 MED ORDER — PHENAZOPYRIDINE HCL 200 MG PO TABS: 200.0000 mg | ORAL_TABLET | Freq: Three times a day (TID) | ORAL | 0 refills | Status: DC | PRN

## 2016-08-06 NOTE — Progress Notes (Signed)
   Patient is a 50 year old with past history of TAH/BSO for endometriosis presented to the office today stating that since yesterday morning she been complaining of worsening dysuria and frequency and suprapubic discomfort is noted blood in her urine. Patient was some mild low back discomfort. She denied any fever, chills, nausea, any vomiting.  Exam: Overweight female in no acute distress Back: No CVA tenderness Abdomen: No suprapubic tenderness Pelvic exam: Not done  Urinalysis: 40-60 WBC, 40-60 RBC, many bacteria  Assessment/plan: Patient with clinical signs and symptoms associated with urinary tract infection (cystitis) who has multiple drug allergies. She has tolerated Cipro in the past. As we wait for the culture and sensitivity she'll be started on Cipro 250 mg twice a day for 3 days. In an effort to alleviate her bladder spasms she'll be prescribed Pyridium 200 mg one by mouth 3 times a day for 3 days. Patient also is very since antibiotics and developed Soyla Dryer infection and for this reason a prescription of Diflucan 150 mg will be provided.

## 2016-08-06 NOTE — Patient Instructions (Addendum)
Fluconazole tablets What is this medicine? FLUCONAZOLE (floo KON na zole) is an antifungal medicine. It is used to treat certain kinds of fungal or yeast infections. This medicine may be used for other purposes; ask your health care provider or pharmacist if you have questions. What should I tell my health care provider before I take this medicine? They need to know if you have any of these conditions: -electrolyte abnormalities -history of irregular heart beat -kidney disease -an unusual or allergic reaction to fluconazole, other azole antifungals, medicines, foods, dyes, or preservatives -pregnant or trying to get pregnant -breast-feeding How should I use this medicine? Take this medicine by mouth. Follow the directions on the prescription label. Do not take your medicine more often than directed. Talk to your pediatrician regarding the use of this medicine in children. Special care may be needed. This medicine has been used in children as young as 58 months of age. Overdosage: If you think you have taken too much of this medicine contact a poison control center or emergency room at once. NOTE: This medicine is only for you. Do not share this medicine with others. What if I miss a dose? If you miss a dose, take it as soon as you can. If it is almost time for your next dose, take only that dose. Do not take double or extra doses. What may interact with this medicine? Do not take this medicine with any of the following medications: -astemizole -certain medicines for irregular heart beat like dofetilide, dronedarone, quinidine -cisapride -erythromycin -lomitapide -other medicines that prolong the QT interval (cause an abnormal heart rhythm) -pimozide -terfenadine -thioridazine -tolvaptan -ziprasidone This medicine may also interact with the following medications: -antiviral medicines for HIV or AIDS -birth control pills -certain antibiotics like rifabutin, rifampin -certain  medicines for blood pressure like amlodipine, isradipine, felodipine, hydrochlorothiazide, losartan, nifedipine -certain medicines for cancer like cyclophosphamide, vinblastine, vincristine -certain medicines for cholesterol like atorvastatin, lovastatin, fluvastatin, simvastatin -certain medicines for depression, anxiety, or psychotic disturbances like amitriptyline, midazolam, nortriptyline, triazolam -certain medicines for diabetes like glipizide, glyburide, tolbutamide -certain medicines for pain like alfentanil, fentanyl, methadone -certain medicines for seizures like carbamazepine, phenytoin -certain medicines that treat or prevent blood clots like warfarin -halofantrine -medicines that lower your chance of fighting infection like cyclosporine, prednisone, tacrolimus -NSAIDS, medicines for pain and inflammation, like celecoxib, diclofenac, flurbiprofen, ibuprofen, meloxicam, naproxen -other medicines for fungal infections -sirolimus -theophylline -tofacitinib This list may not describe all possible interactions. Give your health care provider a list of all the medicines, herbs, non-prescription drugs, or dietary supplements you use. Also tell them if you smoke, drink alcohol, or use illegal drugs. Some items may interact with your medicine. What should I watch for while using this medicine? Visit your doctor or health care professional for regular checkups. If you are taking this medicine for a long time you may need blood work. Tell your doctor if your symptoms do not improve. Some fungal infections need many weeks or months of treatment to cure. Alcohol can increase possible damage to your liver. Avoid alcoholic drinks. If you have a vaginal infection, do not have sex until you have finished your treatment. You can wear a sanitary napkin. Do not use tampons. Wear freshly washed cotton, not synthetic, panties. What side effects may I notice from receiving this medicine? Side effects that  you should report to your doctor or health care professional as soon as possible: -allergic reactions like skin rash or itching, hives, swelling of the lips, mouth,  tongue, or throat -dark urine -feeling dizzy or faint -irregular heartbeat or chest pain -redness, blistering, peeling or loosening of the skin, including inside the mouth -trouble breathing -unusual bruising or bleeding -vomiting -yellowing of the eyes or skin Side effects that usually do not require medical attention (report to your doctor or health care professional if they continue or are bothersome): -changes in how food tastes -diarrhea -headache -stomach upset or nausea This list may not describe all possible side effects. Call your doctor for medical advice about side effects. You may report side effects to FDA at 1-800-FDA-1088. Where should I keep my medicine? Keep out of the reach of children. Store at room temperature below 30 degrees C (86 degrees F). Throw away any medicine after the expiration date. NOTE: This sheet is a summary. It may not cover all possible information. If you have questions about this medicine, talk to your doctor, pharmacist, or health care provider.    2016, Elsevier/[employer] Standard. (2013-04-18 16:13:04) Phenazopyridine tablets What is this medicine? PHENAZOPYRIDINE (fen az oh PEER i deen) is a pain reliever. It is used to stop the pain, burning, or discomfort caused by infection or irritation of the urinary tract. This medicine is not an antibiotic. It will not cure a urinary tract infection. This medicine may be used for other purposes; ask your health care provider or pharmacist if you have questions. What should I tell my health care provider before I take this medicine? They need to know if you have any of these conditions: -glucose-6-phosphate dehydrogenase (G6PD) deficiency -kidney disease -an unusual or allergic reaction to phenazopyridine, other medicines, foods, dyes, or  preservatives -pregnant or trying to get pregnant -breast-feeding How should I use this medicine? Take this medicine by mouth with a glass of water. Follow the directions on the prescription label. Take after meals. Take your doses at regular intervals. Do not take your medicine more often than directed. Do not skip doses or stop your medicine early even if you feel better. Do not stop taking except on your doctor's advice. Talk to your pediatrician regarding the use of this medicine in children. Special care may be needed. Overdosage: If you think you have taken too much of this medicine contact a poison control center or emergency room at once. NOTE: This medicine is only for you. Do not share this medicine with others. What if I miss a dose? If you miss a dose, take it as soon as you can. If it is almost time for your next dose, take only that dose. Do not take double or extra doses. What may interact with this medicine? Interactions are not expected. This list may not describe all possible interactions. Give your health care provider a list of all the medicines, herbs, non-prescription drugs, or dietary supplements you use. Also tell them if you smoke, drink alcohol, or use illegal drugs. Some items may interact with your medicine. What should I watch for while using this medicine? Tell your doctor or health care professional if your symptoms do not improve or if they get worse. This medicine colors body fluids red. This effect is harmless and will go away after you are done taking the medicine. It will change urine to an dark orange or red color. The red color may stain clothing. Soft contact lenses may become permanently stained. It is best not to wear soft contact lenses while taking this medicine. If you are diabetic you may get a false positive result for sugar in your  urine. Talk to your health care provider. What side effects may I notice from receiving this medicine? Side effects that  you should report to your doctor or health care professional as soon as possible: -allergic reactions like skin rash, itching or hives, swelling of the face, lips, or tongue -blue or purple color of the skin -difficulty breathing -fever -less urine -unusual bleeding, bruising -unusual tired, weak -vomiting -yellowing of the eyes or skin Side effects that usually do not require medical attention (report to your doctor or health care professional if they continue or are bothersome): -dark urine -headache -stomach upset This list may not describe all possible side effects. Call your doctor for medical advice about side effects. You may report side effects to FDA at 1-800-FDA-1088. Where should I keep my medicine? Keep out of the reach of children. Store at room temperature between 15 and 30 degrees C (59 and 86 degrees F). Protect from light and moisture. Throw away any unused medicine after the expiration date. NOTE: This sheet is a summary. It may not cover all possible information. If you have questions about this medicine, talk to your doctor, pharmacist, or health care provider.    2016, Elsevier/Gold Standard. (2008-04-08 11:04:07) Ciprofloxacin tablets What is this medicine? CIPROFLOXACIN (sip roe FLOX a sin) is a quinolone antibiotic. It is used to treat certain kinds of bacterial infections. It will not work for colds, flu, or other viral infections. This medicine may be used for other purposes; ask your health care provider or pharmacist if you have questions. What should I tell my health care provider before I take this medicine? They need to know if you have any of these conditions: -bone problems -cerebral disease -history of low levels of potassium in the blood -joint problems -irregular heartbeat -kidney disease -myasthenia gravis -seizures -tendon problems -tingling of the fingers or toes, or other nerve disorder -an unusual or allergic reaction to ciprofloxacin,  other antibiotics or medicines, foods, dyes, or preservatives -pregnant or trying to get pregnant -breast-feeding How should I use this medicine? Take this medicine by mouth with a glass of water. Follow the directions on the prescription label. Take your medicine at regular intervals. Do not take your medicine more often than directed. Take all of your medicine as directed even if you think your are better. Do not skip doses or stop your medicine early. You can take this medicine with food or on an empty stomach. It can be taken with a meal that contains dairy or calcium, but do not take it alone with a dairy product, like milk or yogurt or calcium-fortified juice. A special MedGuide will be given to you by the pharmacist with each prescription and refill. Be sure to read this information carefully each time. Talk to your pediatrician regarding the use of this medicine in children. Special care may be needed. Overdosage: If you think you have taken too much of this medicine contact a poison control center or emergency room at once. NOTE: This medicine is only for you. Do not share this medicine with others. What if I miss a dose? If you miss a dose, take it as soon as you can. If it is almost time for your next dose, take only that dose. Do not take double or extra doses. What may interact with this medicine? Do not take this medicine with any of the following medications: -cisapride -droperidol -terfenadine -tizanidine This medicine may also interact with the following medications: -antacids -birth control pills -caffeine -cyclosporin -  didanosine (ddI) buffered tablets or powder -medicines for diabetes -medicines for inflammation like ibuprofen, naproxen -methotrexate -multivitamins -omeprazole -phenytoin -probenecid -sucralfate -theophylline -warfarin This list may not describe all possible interactions. Give your health care provider a list of all the medicines, herbs,  non-prescription drugs, or dietary supplements you use. Also tell them if you smoke, drink alcohol, or use illegal drugs. Some items may interact with your medicine. What should I watch for while using this medicine? Tell your doctor or health care professional if your symptoms do not improve. Do not treat diarrhea with over the counter products. Contact your doctor if you have diarrhea that lasts more than 2 days or if it is severe and watery. You may get drowsy or dizzy. Do not drive, use machinery, or do anything that needs mental alertness until you know how this medicine affects you. Do not stand or sit up quickly, especially if you are an older patient. This reduces the risk of dizzy or fainting spells. This medicine can make you more sensitive to the sun. Keep out of the sun. If you cannot avoid being in the sun, wear protective clothing and use sunscreen. Do not use sun lamps or tanning beds/booths. Avoid antacids, aluminum, calcium, iron, magnesium, and zinc products for 6 hours before and 2 hours after taking a dose of this medicine. What side effects may I notice from receiving this medicine? Side effects that you should report to your doctor or health care professional as soon as possible: -allergic reactions like skin rash or hives, swelling of the face, lips, or tongue -anxious -confusion -depressed mood -diarrhea -fast, irregular heartbeat -hallucination, loss of contact with reality -joint, muscle, or tendon pain or swelling -pain, tingling, numbness in the hands or feet -suicidal thoughts or other mood changes -sunburn -unusually weak or tired Side effects that usually do not require medical attention (report to your doctor or health care professional if they continue or are bothersome): -dry mouth -headache -nausea -trouble sleeping This list may not describe all possible side effects. Call your doctor for medical advice about side effects. You may report side effects to  FDA at 1-800-FDA-1088. Where should I keep my medicine? Keep out of the reach of children. Store at room temperature below 30 degrees C (86 degrees F). Keep container tightly closed. Throw away any unused medicine after the expiration date. NOTE: This sheet is a summary. It may not cover all possible information. If you have questions about this medicine, talk to your doctor, pharmacist, or health care provider.    2016, Elsevier/Gold Standard. (2015-04-21 12:57:02) Urinary Tract Infection Urinary tract infections (UTIs) can develop anywhere along your urinary tract. Your urinary tract is your body's drainage system for removing wastes and extra water. Your urinary tract includes two kidneys, two ureters, a bladder, and a urethra. Your kidneys are a pair of bean-shaped organs. Each kidney is about the size of your fist. They are located below your ribs, one on each side of your spine. CAUSES Infections are caused by microbes, which are microscopic organisms, including fungi, viruses, and bacteria. These organisms are so small that they can only be seen through a microscope. Bacteria are the microbes that most commonly cause UTIs. SYMPTOMS  Symptoms of UTIs may vary by age and gender of the patient and by the location of the infection. Symptoms in young women typically include a frequent and intense urge to urinate and a painful, burning feeling in the bladder or urethra during urination. Older women and  men are more likely to be tired, shaky, and weak and have muscle aches and abdominal pain. A fever may mean the infection is in your kidneys. Other symptoms of a kidney infection include pain in your back or sides below the ribs, nausea, and vomiting. DIAGNOSIS To diagnose a UTI, your caregiver will ask you about your symptoms. Your caregiver will also ask you to provide a urine sample. The urine sample will be tested for bacteria and white blood cells. White blood cells are made by your body to help  fight infection. TREATMENT  Typically, UTIs can be treated with medication. Because most UTIs are caused by a bacterial infection, they usually can be treated with the use of antibiotics. The choice of antibiotic and length of treatment depend on your symptoms and the type of bacteria causing your infection. HOME CARE INSTRUCTIONS  If you were prescribed antibiotics, take them exactly as your caregiver instructs you. Finish the medication even if you feel better after you have only taken some of the medication.  Drink enough water and fluids to keep your urine clear or pale yellow.  Avoid caffeine, tea, and carbonated beverages. They tend to irritate your bladder.  Empty your bladder often. Avoid holding urine for long periods of time.  Empty your bladder before and after sexual intercourse.  After a bowel movement, women should cleanse from front to back. Use each tissue only once. SEEK MEDICAL CARE IF:   You have back pain.  You develop a fever.  Your symptoms do not begin to resolve within 3 days. SEEK IMMEDIATE MEDICAL CARE IF:   You have severe back pain or lower abdominal pain.  You develop chills.  You have nausea or vomiting.  You have continued burning or discomfort with urination. MAKE SURE YOU:   Understand these instructions.  Will watch your condition.  Will get help right away if you are not doing well or get worse.   This information is not intended to replace advice given to you by your health care provider. Make sure you discuss any questions you have with your health care provider.   Document Released: 06/20/2005 Document Revised: 06/01/2015 Document Reviewed: 10/19/2011 Elsevier Interactive Patient Education [employer].

## 2016-08-06 NOTE — Addendum Note (Signed)
Addended by: Thurnell Garbe A on: 08/06/2016 02:14 PM   Modules accepted: Orders

## 2016-08-08 LAB — URINE CULTURE

## 2016-08-09 ENCOUNTER — Other Ambulatory Visit: Payer: Self-pay | Admitting: Gynecology

## 2016-08-09 DIAGNOSIS — R3129 Other microscopic hematuria: Secondary | ICD-10-CM

## 2016-08-13 ENCOUNTER — Other Ambulatory Visit: Payer: BLUE CROSS/BLUE SHIELD

## 2016-08-13 DIAGNOSIS — R3129 Other microscopic hematuria: Secondary | ICD-10-CM

## 2016-08-14 LAB — URINALYSIS W MICROSCOPIC + REFLEX CULTURE
BACTERIA UA: NONE SEEN [HPF]
BILIRUBIN URINE: NEGATIVE
Casts: NONE SEEN [LPF]
Crystals: NONE SEEN [HPF]
GLUCOSE, UA: NEGATIVE
Hgb urine dipstick: NEGATIVE
Ketones, ur: NEGATIVE
LEUKOCYTES UA: NEGATIVE
NITRITE: NEGATIVE
PROTEIN: NEGATIVE
RBC / HPF: NONE SEEN RBC/HPF (ref <=2)
SPECIFIC GRAVITY, URINE: 1.018 (ref 1.001–1.035)
YEAST: NONE SEEN [HPF]
pH: 5.5 (ref 5.0–8.0)

## 2016-08-15 ENCOUNTER — Telehealth: Payer: Self-pay

## 2016-08-15 LAB — URINE CULTURE: Organism ID, Bacteria: NO GROWTH

## 2016-08-15 NOTE — Telephone Encounter (Signed)
Calling for u/a result from Monday. Patient informed u/a normal and culture negative.

## 2016-10-08 ENCOUNTER — Other Ambulatory Visit: Payer: Self-pay | Admitting: Orthopedic Surgery

## 2016-10-08 DIAGNOSIS — M545 Low back pain, unspecified: Secondary | ICD-10-CM

## 2016-10-10 ENCOUNTER — Other Ambulatory Visit: Payer: BLUE CROSS/BLUE SHIELD

## 2016-10-16 ENCOUNTER — Ambulatory Visit
Admission: RE | Admit: 2016-10-16 | Discharge: 2016-10-16 | Disposition: A | Discharge status: Home / Self Care | Payer: Self-pay | Source: Ambulatory Visit | Attending: Orthopedic Surgery | Admitting: Orthopedic Surgery

## 2016-10-16 ENCOUNTER — Ambulatory Visit
Admission: RE | Admit: 2016-10-16 | Discharge: 2016-10-16 | Disposition: A | Discharge status: Home / Self Care | Payer: BLUE CROSS/BLUE SHIELD | Source: Ambulatory Visit | Attending: Orthopedic Surgery | Admitting: Orthopedic Surgery

## 2016-10-16 ENCOUNTER — Other Ambulatory Visit: Payer: Self-pay | Admitting: Orthopedic Surgery

## 2016-10-16 DIAGNOSIS — M545 Low back pain, unspecified: Secondary | ICD-10-CM

## 2016-11-02 ENCOUNTER — Encounter (HOSPITAL_COMMUNITY): Payer: Self-pay

## 2016-11-08 ENCOUNTER — Other Ambulatory Visit: Payer: Self-pay | Admitting: Gastroenterology

## 2016-11-08 ENCOUNTER — Ambulatory Visit (HOSPITAL_COMMUNITY): Payer: BLUE CROSS/BLUE SHIELD | Admitting: Anesthesiology

## 2016-11-08 ENCOUNTER — Encounter (HOSPITAL_COMMUNITY)
Admission: RE | Disposition: A | Discharge status: Home / Self Care | Payer: Self-pay | Source: Ambulatory Visit | Attending: Gastroenterology

## 2016-11-08 ENCOUNTER — Ambulatory Visit: Payer: Self-pay | Admitting: Gastroenterology

## 2016-11-08 ENCOUNTER — Encounter (HOSPITAL_COMMUNITY): Payer: Self-pay

## 2016-11-08 ENCOUNTER — Ambulatory Visit (HOSPITAL_COMMUNITY)
Admission: RE | Admit: 2016-11-08 | Discharge: 2016-11-08 | Disposition: A | Discharge status: Home / Self Care | Payer: BLUE CROSS/BLUE SHIELD | Source: Ambulatory Visit | Attending: Gastroenterology | Admitting: Gastroenterology

## 2016-11-08 DIAGNOSIS — J45909 Unspecified asthma, uncomplicated: Secondary | ICD-10-CM | POA: Diagnosis not present

## 2016-11-08 DIAGNOSIS — K589 Irritable bowel syndrome without diarrhea: Secondary | ICD-10-CM | POA: Insufficient documentation

## 2016-11-08 DIAGNOSIS — Z8249 Family history of ischemic heart disease and other diseases of the circulatory system: Secondary | ICD-10-CM | POA: Insufficient documentation

## 2016-11-08 DIAGNOSIS — Z833 Family history of diabetes mellitus: Secondary | ICD-10-CM | POA: Insufficient documentation

## 2016-11-08 DIAGNOSIS — K219 Gastro-esophageal reflux disease without esophagitis: Secondary | ICD-10-CM | POA: Diagnosis not present

## 2016-11-08 DIAGNOSIS — Z85828 Personal history of other malignant neoplasm of skin: Secondary | ICD-10-CM | POA: Insufficient documentation

## 2016-11-08 DIAGNOSIS — Z856 Personal history of leukemia: Secondary | ICD-10-CM | POA: Diagnosis not present

## 2016-11-08 DIAGNOSIS — Z87891 Personal history of nicotine dependence: Secondary | ICD-10-CM | POA: Insufficient documentation

## 2016-11-08 DIAGNOSIS — Z823 Family history of stroke: Secondary | ICD-10-CM | POA: Insufficient documentation

## 2016-11-08 DIAGNOSIS — D122 Benign neoplasm of ascending colon: Secondary | ICD-10-CM | POA: Diagnosis not present

## 2016-11-08 DIAGNOSIS — K449 Diaphragmatic hernia without obstruction or gangrene: Secondary | ICD-10-CM | POA: Diagnosis not present

## 2016-11-08 DIAGNOSIS — Z1211 Encounter for screening for malignant neoplasm of colon: Secondary | ICD-10-CM | POA: Diagnosis present

## 2016-11-08 DIAGNOSIS — Z79899 Other long term (current) drug therapy: Secondary | ICD-10-CM | POA: Insufficient documentation

## 2016-11-08 DIAGNOSIS — D123 Benign neoplasm of transverse colon: Secondary | ICD-10-CM | POA: Diagnosis not present

## 2016-11-08 DIAGNOSIS — Z888 Allergy status to other drugs, medicaments and biological substances status: Secondary | ICD-10-CM | POA: Insufficient documentation

## 2016-11-08 DIAGNOSIS — Z7982 Long term (current) use of aspirin: Secondary | ICD-10-CM | POA: Insufficient documentation

## 2016-11-08 DIAGNOSIS — Z882 Allergy status to sulfonamides status: Secondary | ICD-10-CM | POA: Insufficient documentation

## 2016-11-08 DIAGNOSIS — Z801 Family history of malignant neoplasm of trachea, bronchus and lung: Secondary | ICD-10-CM | POA: Diagnosis not present

## 2016-11-08 DIAGNOSIS — Z90710 Acquired absence of both cervix and uterus: Secondary | ICD-10-CM | POA: Insufficient documentation

## 2016-11-08 DIAGNOSIS — M722 Plantar fascial fibromatosis: Secondary | ICD-10-CM | POA: Diagnosis not present

## 2016-11-08 DIAGNOSIS — Z803 Family history of malignant neoplasm of breast: Secondary | ICD-10-CM | POA: Diagnosis not present

## 2016-11-08 DIAGNOSIS — Z83518 Family history of other specified eye disorder: Secondary | ICD-10-CM | POA: Diagnosis not present

## 2016-11-08 DIAGNOSIS — D120 Benign neoplasm of cecum: Secondary | ICD-10-CM | POA: Insufficient documentation

## 2016-11-08 DIAGNOSIS — Z6841 Body Mass Index (BMI) 40.0 and over, adult: Secondary | ICD-10-CM | POA: Insufficient documentation

## 2016-11-08 DIAGNOSIS — Z881 Allergy status to other antibiotic agents status: Secondary | ICD-10-CM | POA: Insufficient documentation

## 2016-11-08 DIAGNOSIS — K648 Other hemorrhoids: Secondary | ICD-10-CM | POA: Diagnosis not present

## 2016-11-08 DIAGNOSIS — D649 Anemia, unspecified: Secondary | ICD-10-CM | POA: Insufficient documentation

## 2016-11-08 DIAGNOSIS — E041 Nontoxic single thyroid nodule: Secondary | ICD-10-CM | POA: Insufficient documentation

## 2016-11-08 DIAGNOSIS — Z880 Allergy status to penicillin: Secondary | ICD-10-CM | POA: Insufficient documentation

## 2016-11-08 DIAGNOSIS — R7303 Prediabetes: Secondary | ICD-10-CM | POA: Insufficient documentation

## 2016-11-08 DIAGNOSIS — Z8379 Family history of other diseases of the digestive system: Secondary | ICD-10-CM | POA: Diagnosis not present

## 2016-11-08 DIAGNOSIS — Z86010 Personal history of colonic polyps: Secondary | ICD-10-CM | POA: Diagnosis not present

## 2016-11-08 HISTORY — PX: COLONOSCOPY WITH PROPOFOL: SHX5780

## 2016-11-08 SURGERY — COLONOSCOPY WITH PROPOFOL
Anesthesia: Monitor Anesthesia Care

## 2016-11-08 MED ORDER — SODIUM CHLORIDE 0.9 % IV SOLN: INTRAVENOUS | Status: DC

## 2016-11-08 MED ORDER — LACTATED RINGERS IV SOLN
INTRAVENOUS | Status: DC
  Administered 2016-11-08: 1000 mL via INTRAVENOUS

## 2016-11-08 MED ORDER — LIDOCAINE 2% (20 MG/ML) 5 ML SYRINGE
INTRAMUSCULAR | Status: AC
  Filled 2016-11-08: qty 5

## 2016-11-08 MED ORDER — LIDOCAINE 2% (20 MG/ML) 5 ML SYRINGE
INTRAMUSCULAR | Status: DC | PRN
  Administered 2016-11-08: 100 mg via INTRAVENOUS

## 2016-11-08 MED ORDER — PROPOFOL 10 MG/ML IV BOLUS
INTRAVENOUS | Status: AC
  Filled 2016-11-08: qty 20

## 2016-11-08 MED ORDER — PROPOFOL 10 MG/ML IV BOLUS
INTRAVENOUS | Status: DC | PRN
  Administered 2016-11-08 (×3): 20 mg via INTRAVENOUS

## 2016-11-08 MED ORDER — PROPOFOL 500 MG/50ML IV EMUL
INTRAVENOUS | Status: DC | PRN
  Administered 2016-11-08: 140 ug/kg/min via INTRAVENOUS

## 2016-11-08 SURGICAL SUPPLY — 22 items

## 2016-11-08 NOTE — Transfer of Care (Signed)
Immediate Anesthesia Transfer of Care Note  Patient: Marissa [name]  Procedure(s) Performed: Procedure(s): COLONOSCOPY WITH PROPOFOL (N/A)  Patient Location: Endoscopy Unit  Anesthesia Type:MAC  Level of Consciousness: awake, alert  and oriented  Airway & Oxygen Therapy: Patient Spontanous Breathing and Patient connected to face mask oxygen  Post-op Assessment: Report given to RN and Post -op Vital signs reviewed and stable  Post vital signs: Reviewed and stable  Last Vitals:  Vitals:   11/08/16 0857  BP: (!) 132/53  Pulse: 76  Resp: 14  Temp: 36.7 C    Last Pain:  Vitals:   11/08/16 0857  TempSrc: Oral  PainSc: 5          Complications: No apparent anesthesia complications

## 2016-11-08 NOTE — Op Note (Signed)
[facility] Patient Name: Marissa [name] Procedure Date: 11/08/2016 MRN: [id number] Attending MD: Ronald Lobo , MD Date of Birth: 1966-05-12 CSN: [id number] Age: 51 Admit Type: Outpatient Procedure:                Colonoscopy Indications:              High risk colon cancer surveillance: Personal                            history of multiple (3 or more) adenomas, Last                            colonoscopy: September 2012 Providers:                Ronald Lobo, MD, Burtis Junes, RN, Elspeth Cho                            Tech., Technician, Danley Danker, CRNA Referring MD:              Medicines:                Monitored Anesthesia Care Complications:            No immediate complications. Estimated Blood Loss:     Estimated blood loss: 3 mL. Procedure:                Pre-Anesthesia Assessment:                           - Prior to the procedure, a History and Physical                            was performed, and patient medications and                            allergies were reviewed. The patient's tolerance of                            previous anesthesia was also reviewed. The risks                            and benefits of the procedure and the sedation                            options and risks were discussed with the patient.                            All questions were answered, and informed consent                            was obtained. Prior Anticoagulants: The patient has                            taken aspirin, last dose was 1 day prior to  procedure. ASA Grade Assessment: III - A patient                            with severe systemic disease. After reviewing the                            risks and benefits, the patient was deemed in                            satisfactory condition to undergo the procedure.                           After obtaining informed consent, the colonoscope                            was  passed under direct vision. Throughout the                            procedure, the patient's blood pressure, pulse, and                            oxygen saturations were monitored continuously. The                            EC-[id number] scope was introduced through                            the anus and advanced to the the terminal ileum.                            The colonoscopy was performed without difficulty.                            The patient tolerated the procedure well. The                            quality of the bowel preparation was good. The                            terminal ileum, the appendiceal orifice and the                            rectum were photographed. Scope In: 11:34:56 AM Scope Out: 11:59:02 AM Scope Withdrawal Time: 0 hours 21 minutes 36 seconds  Total Procedure Duration: 0 hours 24 minutes 6 seconds  Findings:      A 4 mm polyp was found in the cecum. The polyp was semi-sessile. The       polyp was removed with a cold snare and a small amount of residual       tissue was touched up with a cold biopsy forceps. Resection and       retrieval were complete. Estimated blood loss was minimal.      A 10 mm polyp was found in the ascending colon. The polyp was sessile.  The polyp was removed piecemeal in about 4 pieces with a cold snare.       Resection and retrieval were complete. Estimated blood loss: gradual but       fairly persistent oozing for several minutes, totalling 2 mL. Complete       hemostasis was subsequently confirmed.      A 4 mm polyp was found in the ascending colon. The polyp was sessile.       The polyp was removed with a cold snare. Resection and retrieval were       complete. Estimated blood loss was minimal.      A 5 mm polyp was found in the transverse colon. The polyp was sessile.       The polyp was removed with a cold snare. Resection and retrieval were       complete. Estimated blood loss was minimal.      The  exam was otherwise normal throughout the examined colon. No       diverticulosis or AVM's.      Non-bleeding internal hemorrhoids were found during endoscopy. The       hemorrhoids were moderate.      The terminal ileum appeared normal.      The retroflexed view of the distal rectum and anal verge was normal and       showed no anal or rectal abnormalities. Reinspection of the rectum       showed no additional findings. Impression:               - One 4 mm polyp in the cecum, removed with a cold                            snare. Resected and retrieved.                           - One 10 mm polyp in the ascending colon, removed                            with a cold snare. Resected and retrieved.                           - One 4 mm polyp in the ascending colon, removed                            with a cold snare. Resected and retrieved.                           - One 5 mm polyp in the transverse colon, removed                            with a cold snare. Resected and retrieved.                           - Non-bleeding internal hemorrhoids.                           - The examined portion of the ileum was normal.                           -  The distal rectum and anal verge are normal on                            retroflexion view. Moderate Sedation:      This patient was sedated with monitored anesthesia care, not moderate       sedation. Recommendation:           - Await pathology results.                           - If the pathology report reveals adenomatous                            tissue, then repeat the colonoscopy for                            surveillance in 3 - 5 years. Otherwise,                            surveillance in 5 years b/o prior polyp history.                           - Resume previous diet.                           - Continue present medications.                           - Resume aspirin at prior dose tomorrow. Procedure Code(s):        --- Professional  ---                           [id number], Colonoscopy, flexible; with removal of                            tumor(s), polyp(s), or other lesion(s) by snare                            technique Diagnosis Code(s):        --- Professional ---                           Z86.010, Personal history of colonic polyps                           D12.0, Benign neoplasm of cecum                           D12.2, Benign neoplasm of ascending colon                           D12.3, Benign neoplasm of transverse colon (hepatic                            flexure or splenic flexure) CPT copyright 2016 [employer]. All rights reserved. The codes documented in this report are preliminary  and upon coder review may  be revised to meet current compliance requirements. Ronald Lobo, MD 11/08/2016 12:18:53 PM This report has been signed electronically. Number of Addenda: 0

## 2016-11-08 NOTE — Discharge Instructions (Signed)

## 2016-11-08 NOTE — H&P (Signed)
Marissa [name] is an 51 y.o. female.   Chief Complaint:  History of colon polyps HPI: s/p removal of 3 small adenomas 05/2011, asymptomatic from LGI standpoint exc for occas bld after heavy lifting  Past Medical History:  Diagnosis Date  . Abdominal pain    INTERMITANT  . Anemia   . Asthma   . GERD (gastroesophageal reflux disease)   . H/O hiatal hernia   . Hemorrhoids   . History of transfusion   . IBS (irritable bowel syndrome)   . Leukemia ([place])    as a child, acute lymphatic with T cell  . Plantar fasciitis   . Plantar fasciitis of left foot FALL 2012   HAD SURGERY TO FIX IT  . Prediabetes   . Rectal pain   . 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 Thyroid nodule     Past Surgical History:  Procedure Laterality Date  . ABDOMINAL HYSTERECTOMY  2003   BSO  . BIOPSY THYROID  11/2009   benign  . carpel tunnel  2004   rt hand  . eye biospy  1979   dx with leukemia from this biospy  . eye biospy  1979   rt eye, dx: leukemia  . EYE SURGERY  1982   rt eye  . NASAL SINUS SURGERY  1999  . PLANTAR FASCIA SURGERY  2004, 2012   rt foot, left foot  . RADICAL HYSTERECTOMY    . skin cancer removal on shoulder  2/14  . THYROID LOBECTOMY Left 09/25/2013   Procedure: LEFT THYROID LOBECTOMY;  Surgeon: Earnstine Regal, MD;  Location: [facility] ORS;  Service: General;  Laterality: Left;    Family History  Problem Relation Age of Onset  . Cancer Mother 17    LUNG  . Diabetes Mother   . Diabetes Father   . Stroke Father   . Heart disease Father   . Vision loss Father     GLAUCOMA  . Cancer Maternal Aunt     breast  . Diverticulosis     Social History:  reports that she quit smoking about 25 years ago. She has never used smokeless tobacco. She reports that she drinks alcohol. She reports that she does not use drugs.  Allergies:  Allergies  Allergen Reactions  . Breo Ellipta [Fluticasone Furoate-Vilanterol] Anaphylaxis  . Erythromycin Shortness Of Breath  . Penicillins  Shortness Of Breath    Has patient had a PCN reaction causing immediate rash, facial/tongue/throat swelling, SOB or lightheadedness with hypotension: Yes Has patient had a PCN reaction causing severe rash involving mucus membranes or skin necrosis: Yes Has patient had a PCN reaction that required hospitalization Unknown Has patient had a PCN reaction occurring within the last 10 years: No If all of the above answers are "NO", then may proceed with Cephalosporin use.   . Quinine Derivatives Shortness Of Breath  . Sulfa Antibiotics Shortness Of Breath    Medications Prior to Admission  Medication Sig Dispense Refill  . aspirin 81 MG tablet Take 81 mg by mouth at bedtime.     . beclomethasone (QVAR) 80 MCG/ACT inhaler Inhale 2 puffs into the lungs 2 (two) times daily.     . calcium carbonate (TUMS - DOSED IN MG ELEMENTAL CALCIUM) 500 MG chewable tablet Chew 2 tablets by mouth daily as needed for indigestion or heartburn.    Marland Kitchen CINNAMON PO Take 1 tablet by mouth daily as needed (blood sugar spikes).    . CRANBERRY PO  Take 2-3 tablets by mouth daily as needed (uti symtpoms).    . fexofenadine (ALLEGRA) 180 MG tablet Take 180 mg by mouth daily.     . fluticasone (FLONASE) 50 MCG/ACT nasal spray Place 1 spray into both nostrils daily.    Marland Kitchen ibuprofen (ADVIL,MOTRIN) 200 MG tablet Take 800-1,200 mg by mouth 2 (two) times daily as needed for mild pain or moderate pain.     Marland Kitchen levothyroxine (SYNTHROID, LEVOTHROID) 50 MCG tablet Take 50 mcg by mouth daily before breakfast.    . montelukast (SINGULAIR) 10 MG tablet Take 10 mg by mouth at bedtime.   5  . nystatin-triamcinolone ointment (MYCOLOG) Apply 1 application topically 2 (two) times daily. (Patient taking differently: Apply 1 application topically daily. ) 60 g 1  . PROAIR HFA 108 (90 BASE) MCG/ACT inhaler Inhale 1-2 puffs into the lungs every 6 (six) hours as needed for wheezing or shortness of breath.   0  . ciprofloxacin (CIPRO) 250 MG tablet Take 1  tablet (250 mg total) by mouth 2 (two) times daily. (Patient not taking: Reported on 11/02/2016) 6 tablet 0  . phenazopyridine (PYRIDIUM) 200 MG tablet Take 1 tablet (200 mg total) by mouth 3 (three) times daily as needed for pain. (Patient not taking: Reported on 11/02/2016) 9 tablet 0    No results found for this or any previous visit (from the past 48 hour(s)). No results found.  ROS  slt rt sided abd pn--?muscle strain  Blood pressure (!) 132/53, pulse 76, temperature 98.1 F (36.7 C), temperature source Oral, resp. rate 14, height 5\' 3"  (1.6 m), weight (!) 149.2 kg (329 lb), last menstrual period 07/23/2002, SpO2 94 %. Physical Exam morbidly obese, neuro nl, chest clr, heart nl, abd NT  Assessment/Plan H/o colon polyps, for surveillance colonoscopy today  Cleotis Nipper, MD 11/08/2016, 11:20 AM

## 2016-11-08 NOTE — Anesthesia Postprocedure Evaluation (Addendum)
Anesthesia Post Note  Patient: Marissa [name]  Procedure(s) Performed: Procedure(s) (LRB): COLONOSCOPY WITH PROPOFOL (N/A)  Patient location during evaluation: Endoscopy Anesthesia Type: MAC Level of consciousness: awake and alert Pain management: pain level controlled Vital Signs Assessment: post-procedure vital signs reviewed and stable Respiratory status: spontaneous breathing, nonlabored ventilation, respiratory function stable and patient connected to nasal cannula oxygen Cardiovascular status: stable and blood pressure returned to baseline Anesthetic complications: no       Last Vitals:  Vitals:   11/08/16 1207 11/08/16 1210  BP: 133/63 124/68  Pulse: 85 86  Resp:  (!) 23  Temp: 36.6 C     Last Pain:  Vitals:   11/08/16 1207  TempSrc: Oral  PainSc:                  Kourtland Coopman,JAMES TERRILL

## 2016-11-08 NOTE — Anesthesia Preprocedure Evaluation (Addendum)
Anesthesia Evaluation  Patient identified by MRN, date of birth, ID band Patient awake    Airway Mallampati: I  TM Distance: >3 FB Neck ROM: Full    Dental  (+) Teeth Intact, Dental Advisory Given   Pulmonary asthma , former smoker,    breath sounds clear to auscultation       Cardiovascular negative cardio ROS   Rhythm:Regular Rate:Normal     Neuro/Psych    GI/Hepatic hiatal hernia, GERD  ,  Endo/Other  Morbid obesity  Renal/GU      Musculoskeletal   Abdominal (+) + obese,   Peds  Hematology   Anesthesia Other Findings   Reproductive/Obstetrics                            Anesthesia Physical Anesthesia Plan  ASA: III  Anesthesia Plan: MAC   Post-op Pain Management:    Induction: Intravenous  Airway Management Planned: Natural Airway  Additional Equipment:   Intra-op Plan:   Post-operative Plan:   Informed Consent: I have reviewed the patients History and Physical, chart, labs and discussed the procedure including the risks, benefits and alternatives for the proposed anesthesia with the patient or authorized representative who has indicated his/her understanding and acceptance.     Plan Discussed with:   Anesthesia Plan Comments:        Anesthesia Quick Evaluation

## 2016-11-09 ENCOUNTER — Encounter (HOSPITAL_COMMUNITY): Payer: Self-pay | Admitting: Gastroenterology

## 2017-02-06 ENCOUNTER — Encounter: Payer: Self-pay | Admitting: Gynecology

## 2017-02-22 NOTE — Addendum Note (Signed)
Addendum  created 02/22/17 1127 by Rica Koyanagi, MD   Sign clinical note

## 2017-03-08 ENCOUNTER — Other Ambulatory Visit: Payer: Self-pay | Admitting: Family Medicine

## 2017-03-08 DIAGNOSIS — Z1231 Encounter for screening mammogram for malignant neoplasm of breast: Secondary | ICD-10-CM

## 2017-03-18 ENCOUNTER — Ambulatory Visit (INDEPENDENT_AMBULATORY_CARE_PROVIDER_SITE_OTHER): Payer: BLUE CROSS/BLUE SHIELD | Admitting: Women's Health

## 2017-03-18 ENCOUNTER — Encounter: Payer: Self-pay | Admitting: Women's Health

## 2017-03-18 VITALS — BP 132/80 | Ht 63.0 in | Wt 321.0 lb

## 2017-03-18 DIAGNOSIS — B3731 Acute candidiasis of vulva and vagina: Secondary | ICD-10-CM

## 2017-03-18 DIAGNOSIS — R350 Frequency of micturition: Secondary | ICD-10-CM

## 2017-03-18 DIAGNOSIS — B373 Candidiasis of vulva and vagina: Secondary | ICD-10-CM

## 2017-03-18 DIAGNOSIS — Z01419 Encounter for gynecological examination (general) (routine) without abnormal findings: Secondary | ICD-10-CM

## 2017-03-18 MED ORDER — NYSTATIN-TRIAMCINOLONE 100000-0.1 UNIT/GM-% EX OINT: 1.0000 "application " | TOPICAL_OINTMENT | Freq: Two times a day (BID) | CUTANEOUS | 1 refills | Status: DC

## 2017-03-18 NOTE — Progress Notes (Signed)
Marissa [name] [birth year]   [id number]    History:    Presents for annual exam.  2003 TAH with BSO for endometriosis. Normal Pap and mammogram history. 10/2016: Adenoma 4, 3 year follow-up. 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 Not sexually active. IBS, hypothyroid primary care managing. Morbid obesity.  Past medical history, past surgical history, family history and social history were all reviewed and documented in the EPIC chart. Was laid off from a job and is still unemployed. Parents diabetes.  ROS:  A ROS was performed and pertinent positives and negatives are included.  Exam:  Vitals:   03/18/17 1601  BP: 132/80  Weight: (!) 321 lb (145.6 kg)  Height: 5\' 3"  (1.6 m)   Body mass index is 56.86 kg/m.   General appearance:  Normal Thyroid:  Symmetrical, normal in size, without palpable masses or nodularity. Respiratory  Auscultation:  Clear without wheezing or rhonchi Cardiovascular  Auscultation:  Regular rate, without rubs, murmurs or gallops  Edema/varicosities:  Not grossly evident Abdominal  Soft,nontender, without masses, guarding or rebound.  Liver/spleen:  No organomegaly noted  Hernia:  None appreciated  Skin  Inspection:  Grossly normal   Breasts: Examined lying and sitting.     Right: Without masses, retractions, discharge or axillary adenopathy.     Left: Without masses, retractions, discharge or axillary adenopathy. Gentitourinary   Inguinal/mons:  Normal without inguinal adenopathy  External genitalia:  Normal  Enlarged fat pad on suprapubic area soft, nontender, no palpable nodules or erythema  BUS/Urethra/Skene's glands:  Normal  Vagina:  Normal  Cervix: Absent  Uterus: Absent  Adnexa/parametria:     Rt: Without masses or tenderness.   Lt: Without masses or tenderness.  Anus and perineum: Normal  Digital rectal exam: Normal sphincter tone without palpated masses or tenderness  Assessment/Plan:  51 y.o. S WF G0  for annual exam with complaint of questionable  swelling in the suprapubic area.  2003 TAH with BSO for endometriosis on no HRT 1979 ALL remission Morbid obesity BMI greater than 54 10/2016  4 colon Adenomatous polyps - 3 year follow-up Hypothyroid-primary care manages labs and meds.  Plan: SBE's, reviewed importance of annual screening mammogram has scheduled. Reviewed importance of increasing daily walking, decreasing carbohydrates, simple sugars, soda for weight loss. Reviewed UA negative leukocytes, 0-5 WBCs, had been treated for UTI at primary care several weeks ago with Cipro.    [place], 4:55 PM 03/18/2017

## 2017-03-18 NOTE — Patient Instructions (Signed)
Carbohydrate Counting for Diabetes Mellitus, Adult Carbohydrate counting is a method for keeping track of how many carbohydrates you eat. Eating carbohydrates naturally increases the amount of sugar (glucose) in the blood. Counting how many carbohydrates you eat helps keep your blood glucose within normal limits, which helps you manage your diabetes (diabetes mellitus). It is important to know how many carbohydrates you can safely have in each meal. This is different for every person. A diet and nutrition specialist (registered dietitian) can help you make a meal plan and calculate how many carbohydrates you should have at each meal and snack. Carbohydrates are found in the following foods:  Grains, such as breads and cereals.  Dried beans and soy products.  Starchy vegetables, such as potatoes, peas, and corn.  Fruit and fruit juices.  Milk and yogurt.  Sweets and snack foods, such as cake, cookies, candy, chips, and soft drinks.  How do I count carbohydrates? There are two ways to count carbohydrates in food. You can use either of the methods or a combination of both. Reading "Nutrition Facts" on packaged food The "Nutrition Facts" list is included on the labels of almost all packaged foods and beverages in the [place] It includes:  The serving size.  Information about nutrients in each serving, including the grams (g) of carbohydrate per serving.  To use the "Nutrition Facts":  Decide how many servings you will have.  Multiply the number of servings by the number of carbohydrates per serving.  The resulting number is the total amount of carbohydrates that you will be having.  Learning standard serving sizes of other foods When you eat foods containing carbohydrates that are not packaged or do not include "Nutrition Facts" on the label, you need to measure the servings in order to count the amount of carbohydrates:  Measure the foods that you will eat with a food scale or  measuring cup, if needed.  Decide how many standard-size servings you will eat.  Multiply the number of servings by 15. Most carbohydrate-rich foods have about 15 g of carbohydrates per serving. ? For example, if you eat 8 oz (170 g) of strawberries, you will have eaten 2 servings and 30 g of carbohydrates (2 servings x 15 g = 30 g).  For foods that have more than one food mixed, such as soups and casseroles, you must count the carbohydrates in each food that is included.  The following list contains standard serving sizes of common carbohydrate-rich foods. Each of these servings has about 15 g of carbohydrates:   hamburger bun or  English muffin.   oz (15 mL) syrup.   oz (14 g) jelly.  1 slice of bread.  1 six-inch tortilla.  3 oz (85 g) cooked rice or pasta.  4 oz (113 g) cooked dried beans.  4 oz (113 g) starchy vegetable, such as peas, corn, or potatoes.  4 oz (113 g) hot cereal.  4 oz (113 g) mashed potatoes or  of a large baked potato.  4 oz (113 g) canned or frozen fruit.  4 oz (120 mL) fruit juice.  4-6 crackers.  6 chicken nuggets.  6 oz (170 g) unsweetened dry cereal.  6 oz (170 g) plain fat-free yogurt or yogurt sweetened with artificial sweeteners.  8 oz (240 mL) milk.  8 oz (170 g) fresh fruit or one small piece of fruit.  24 oz (680 g) popped popcorn.  Example of carbohydrate counting Sample meal  3 oz (85 g) chicken breast.    6 oz (170 g) brown rice.  4 oz (113 g) corn.  8 oz (240 mL) milk.  8 oz (170 g) strawberries with sugar-free whipped topping. Carbohydrate calculation 1. Identify the foods that contain carbohydrates: ? Rice. ? Corn. ? Milk. ? Strawberries. 2. Calculate how many servings you have of each food: ? 2 servings rice. ? 1 serving corn. ? 1 serving milk. ? 1 serving strawberries. 3. Multiply each number of servings by 15 g: ? 2 servings rice x 15 g = 30 g. ? 1 serving corn x 15 g = 15 g. ? 1 serving milk x 15  g = 15 g. ? 1 serving strawberries x 15 g = 15 g. 4. Add together all of the amounts to find the total grams of carbohydrates eaten: ? 30 g + 15 g + 15 g + 15 g = 75 g of carbohydrates total. This information is not intended to replace advice given to you by your health care provider. Make sure you discuss any questions you have with your health care provider. Document Released: 09/10/2005 Document Revised: 03/30/2016 Document Reviewed: 02/22/2016 Elsevier Interactive Patient Education  2018 [employer] Health Maintenance for Postmenopausal Women Menopause is a normal process in which your reproductive ability comes to an end. This process happens gradually over a span of months to years, usually between the ages of 48 and 55. Menopause is complete when you have missed 12 consecutive menstrual periods. It is important to talk with your health care provider about some of the most common conditions that affect postmenopausal women, such as heart disease, cancer, and bone loss (osteoporosis). Adopting a healthy lifestyle and getting preventive care can help to promote your health and wellness. Those actions can also lower your chances of developing some of these common conditions. What should I know about menopause? During menopause, you may experience a number of symptoms, such as:  Moderate-to-severe hot flashes.  Night sweats.  Decrease in sex drive.  Mood swings.  Headaches.  Tiredness.  Irritability.  Memory problems.  Insomnia.  Choosing to treat or not to treat menopausal changes is an individual decision that you make with your health care provider. What should I know about hormone replacement therapy and supplements? Hormone therapy products are effective for treating symptoms that are associated with menopause, such as hot flashes and night sweats. Hormone replacement carries certain risks, especially as you become older. If you are thinking about using estrogen or estrogen  with progestin treatments, discuss the benefits and risks with your health care provider. What should I know about heart disease and stroke? Heart disease, heart attack, and stroke become more likely as you age. This may be due, in part, to the hormonal changes that your body experiences during menopause. These can affect how your body processes dietary fats, triglycerides, and cholesterol. Heart attack and stroke are both medical emergencies. There are many things that you can do to help prevent heart disease and stroke:  Have your blood pressure checked at least every 1-2 years. High blood pressure causes heart disease and increases the risk of stroke.  If you are 55-79 years old, ask your health care provider if you should take aspirin to prevent a heart attack or a stroke.  Do not use any tobacco products, including cigarettes, chewing tobacco, or electronic cigarettes. If you need help quitting, ask your health care provider.  It is important to eat a healthy diet and maintain a healthy weight. ? Be sure to include   plenty of vegetables, fruits, low-fat dairy products, and lean protein. ? Avoid eating foods that are high in solid fats, added sugars, or salt (sodium).  Get regular exercise. This is one of the most important things that you can do for your health. ? Try to exercise for at least 150 minutes each week. The type of exercise that you do should increase your heart rate and make you sweat. This is known as moderate-intensity exercise. ? Try to do strengthening exercises at least twice each week. Do these in addition to the moderate-intensity exercise.  Know your numbers.Ask your health care provider to check your cholesterol and your blood glucose. Continue to have your blood tested as directed by your health care provider.  What should I know about cancer screening? There are several types of cancer. Take the following steps to reduce your risk and to catch any cancer development  as early as possible. Breast Cancer  Practice breast self-awareness. ? This means understanding how your breasts normally appear and feel. ? It also means doing regular breast self-exams. Let your health care provider know about any changes, no matter how small.  If you are 40 or older, have a clinician do a breast exam (clinical breast exam or CBE) every year. Depending on your age, family history, and medical history, it may be recommended that you also have a yearly breast X-ray (mammogram).  If you have a family history of breast cancer, talk with your health care provider about genetic screening.  If you are at high risk for breast cancer, talk with your health care provider about having an MRI and a mammogram every year.  Breast cancer (BRCA) gene test is recommended for women who have family members with BRCA-related cancers. Results of the assessment will determine the need for genetic counseling and BRCA1 and for BRCA2 testing. BRCA-related cancers include these types: ? Breast. This occurs in males or females. ? Ovarian. ? Tubal. This may also be called fallopian tube cancer. ? Cancer of the abdominal or pelvic lining (peritoneal cancer). ? Prostate. ? Pancreatic.  Cervical, Uterine, and Ovarian Cancer Your health care provider may recommend that you be screened regularly for cancer of the pelvic organs. These include your ovaries, uterus, and vagina. This screening involves a pelvic exam, which includes checking for microscopic changes to the surface of your cervix (Pap test).  For women ages 21-65, health care providers may recommend a pelvic exam and a Pap test every three years. For women ages 30-65, they may recommend the Pap test and pelvic exam, combined with testing for human papilloma virus (HPV), every five years. Some types of HPV increase your risk of cervical cancer. Testing for HPV may also be done on women of any age who have unclear Pap test results.  Other health  care providers may not recommend any screening for nonpregnant women who are considered low risk for pelvic cancer and have no symptoms. Ask your health care provider if a screening pelvic exam is right for you.  If you have had past treatment for cervical cancer or a condition that could lead to cancer, you need Pap tests and screening for cancer for at least 20 years after your treatment. If Pap tests have been discontinued for you, your risk factors (such as having a new sexual partner) need to be reassessed to determine if you should start having screenings again. Some women have medical problems that increase the chance of getting cervical cancer. In these cases, your   health care provider may recommend that you have screening and Pap tests more often.  If you have a family history of uterine cancer or ovarian cancer, talk with your health care provider about genetic screening.  If you have vaginal bleeding after reaching menopause, tell your health care provider.  There are currently no reliable tests available to screen for ovarian cancer.  Lung Cancer Lung cancer screening is recommended for adults 55-80 years old who are at high risk for lung cancer because of a history of smoking. A yearly low-dose CT scan of the lungs is recommended if you:  Currently smoke.  Have a history of at least 30 pack-years of smoking and you currently smoke or have quit within the past 15 years. A pack-year is smoking an average of one pack of cigarettes per day for one year.  Yearly screening should:  Continue until it has been 15 years since you quit.  Stop if you develop a health problem that would prevent you from having lung cancer treatment.  Colorectal Cancer  This type of cancer can be detected and can often be prevented.  Routine colorectal cancer screening usually begins at age 50 and continues through age 75.  If you have risk factors for colon cancer, your health care provider may  recommend that you be screened at an earlier age.  If you have a family history of colorectal cancer, talk with your health care provider about genetic screening.  Your health care provider may also recommend using home test kits to check for hidden blood in your stool.  A small camera at the end of a tube can be used to examine your colon directly (sigmoidoscopy or colonoscopy). This is done to check for the earliest forms of colorectal cancer.  Direct examination of the colon should be repeated every 5-10 years until age 75. However, if early forms of precancerous polyps or small growths are found or if you have a family history or genetic risk for colorectal cancer, you may need to be screened more often.  Skin Cancer  Check your skin from head to toe regularly.  Monitor any moles. Be sure to tell your health care provider: ? About any new moles or changes in moles, especially if there is a change in a mole's shape or color. ? If you have a mole that is larger than the size of a pencil eraser.  If any of your family members has a history of skin cancer, especially at a Shayanna Thatch age, talk with your health care provider about genetic screening.  Always use sunscreen. Apply sunscreen liberally and repeatedly throughout the day.  Whenever you are outside, protect yourself by wearing long sleeves, pants, a wide-brimmed hat, and sunglasses.  What should I know about osteoporosis? Osteoporosis is a condition in which bone destruction happens more quickly than new bone creation. After menopause, you may be at an increased risk for osteoporosis. To help prevent osteoporosis or the bone fractures that can happen because of osteoporosis, the following is recommended:  If you are 19-50 years old, get at least 1,000 mg of calcium and at least 600 mg of vitamin D per day.  If you are older than age 50 but younger than age 70, get at least 1,200 mg of calcium and at least 600 mg of vitamin D per  day.  If you are older than age 70, get at least 1,200 mg of calcium and at least 800 mg of vitamin D per day.    Smoking and excessive alcohol intake increase the risk of osteoporosis. Eat foods that are rich in calcium and vitamin D, and do weight-bearing exercises several times each week as directed by your health care provider. What should I know about how menopause affects my mental health? Depression may occur at any age, but it is more common as you become older. Common symptoms of depression include:  Low or sad mood.  Changes in sleep patterns.  Changes in appetite or eating patterns.  Feeling an overall lack of motivation or enjoyment of activities that you previously enjoyed.  Frequent crying spells.  Talk with your health care provider if you think that you are experiencing depression. What should I know about immunizations? It is important that you get and maintain your immunizations. These include:  Tetanus, diphtheria, and pertussis (Tdap) booster vaccine.  Influenza every year before the flu season begins.  Pneumonia vaccine.  Shingles vaccine.  Your health care provider may also recommend other immunizations. This information is not intended to replace advice given to you by your health care provider. Make sure you discuss any questions you have with your health care provider. Document Released: 11/02/2005 Document Revised: 03/30/2016 Document Reviewed: 06/14/2015 Elsevier Interactive Patient Education  2018 [employer].

## 2017-03-19 LAB — URINALYSIS W MICROSCOPIC + REFLEX CULTURE
Bilirubin Urine: NEGATIVE
CASTS: NONE SEEN [LPF]
CRYSTALS: NONE SEEN [HPF]
Ketones, ur: NEGATIVE
Leukocytes, UA: NEGATIVE
NITRITE: NEGATIVE
YEAST: NONE SEEN [HPF]
pH: 6 (ref 5.0–8.0)

## 2017-03-20 ENCOUNTER — Encounter: Payer: Self-pay | Admitting: Women's Health

## 2017-03-20 LAB — URINE CULTURE: Organism ID, Bacteria: NO GROWTH

## 2017-03-25 ENCOUNTER — Encounter: Payer: Self-pay | Admitting: Women's Health

## 2017-03-25 ENCOUNTER — Ambulatory Visit
Admission: RE | Admit: 2017-03-25 | Discharge: 2017-03-25 | Disposition: A | Discharge status: Home / Self Care | Payer: BLUE CROSS/BLUE SHIELD | Source: Ambulatory Visit | Attending: Family Medicine | Admitting: Family Medicine

## 2017-03-25 DIAGNOSIS — Z1231 Encounter for screening mammogram for malignant neoplasm of breast: Secondary | ICD-10-CM

## 2017-05-08 ENCOUNTER — Encounter: Payer: Self-pay | Admitting: Women's Health

## 2017-05-08 ENCOUNTER — Ambulatory Visit (INDEPENDENT_AMBULATORY_CARE_PROVIDER_SITE_OTHER): Payer: BLUE CROSS/BLUE SHIELD | Admitting: Women's Health

## 2017-05-08 ENCOUNTER — Other Ambulatory Visit: Payer: Self-pay | Admitting: Women's Health

## 2017-05-08 VITALS — BP 130/82 | Ht 63.0 in | Wt 321.0 lb

## 2017-05-08 DIAGNOSIS — R300 Dysuria: Secondary | ICD-10-CM

## 2017-05-08 LAB — URINALYSIS W MICROSCOPIC + REFLEX CULTURE
Bilirubin Urine: NEGATIVE
CRYSTALS: NONE SEEN [HPF]
Casts: NONE SEEN [LPF]
Glucose, UA: NEGATIVE
HGB URINE DIPSTICK: NEGATIVE
Ketones, ur: NEGATIVE
Leukocytes, UA: NEGATIVE
Nitrite: NEGATIVE
PROTEIN: NEGATIVE
RBC / HPF: NONE SEEN RBC/HPF (ref <=2)
Specific Gravity, Urine: <1.005 (ref 1.001–1.035)
YEAST: NONE SEEN [HPF]
pH: 6 (ref 5.0–8.0)

## 2017-05-08 NOTE — Progress Notes (Signed)
Presents with complaint of questionable UTI. States has an intermittent right hip pain radiating to her groin for the past 3 days,  pain at the end of stream of urination, increased frequency of urination, for the past 2 days. Denies vaginal discharge, suprapubic pain, change in bowel elimination, or fever. Denies change in routine or activity. Not sexually active for years. 2003 TAH with BSO. 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 Primary care manages hypothyroid and asthma.   Exam: Appears well. No CVAT. Lower right sided back discomfort . Morbid obesity. UA: Negative blood, negative nitrites, negative leukocytes, 0-5 WBCs, no RBCs, 0-5 squamous epithelials, few bacteria  Questionable sciatica-type back pain  Plan: Urine results reviewed. Urine culture pending. Continue increasing plain water consumption.  Encouraged to avoid heavy lifting, twisting, alternate Motrin and Tylenol for discomfort.

## 2017-05-10 LAB — URINE CULTURE

## 2017-05-12 ENCOUNTER — Encounter: Payer: Self-pay | Admitting: Women's Health

## 2017-07-22 ENCOUNTER — Other Ambulatory Visit: Payer: Self-pay | Admitting: Orthopedic Surgery

## 2017-07-22 DIAGNOSIS — M5136 Other intervertebral disc degeneration, lumbar region: Secondary | ICD-10-CM

## 2017-07-30 ENCOUNTER — Ambulatory Visit
Admission: RE | Admit: 2017-07-30 | Discharge: 2017-07-30 | Disposition: A | Discharge status: Home / Self Care | Payer: BLUE CROSS/BLUE SHIELD | Source: Ambulatory Visit | Attending: Orthopedic Surgery | Admitting: Orthopedic Surgery

## 2017-07-30 DIAGNOSIS — M5136 Other intervertebral disc degeneration, lumbar region: Secondary | ICD-10-CM

## 2017-07-30 MED ORDER — METHYLPREDNISOLONE ACETATE 40 MG/ML INJ SUSP (RADIOLOG
120.0000 mg | Freq: Once | INTRAMUSCULAR | Status: AC
  Administered 2017-07-30: 120 mg via EPIDURAL

## 2017-07-30 MED ORDER — IOPAMIDOL (ISOVUE-M 200) INJECTION 41%
1.0000 mL | Freq: Once | INTRAMUSCULAR | Status: AC
  Administered 2017-07-30: 1 mL via EPIDURAL

## 2017-07-30 NOTE — Discharge Instructions (Signed)

## 2017-08-14 ENCOUNTER — Other Ambulatory Visit: Payer: Self-pay | Admitting: Orthopedic Surgery

## 2017-08-14 DIAGNOSIS — M545 Low back pain: Principal | ICD-10-CM

## 2017-08-14 DIAGNOSIS — G8929 Other chronic pain: Secondary | ICD-10-CM

## 2017-08-20 ENCOUNTER — Ambulatory Visit
Admission: RE | Admit: 2017-08-20 | Discharge: 2017-08-20 | Disposition: A | Discharge status: Home / Self Care | Payer: BLUE CROSS/BLUE SHIELD | Source: Ambulatory Visit | Attending: Orthopedic Surgery | Admitting: Orthopedic Surgery

## 2017-08-20 DIAGNOSIS — G8929 Other chronic pain: Secondary | ICD-10-CM

## 2017-08-20 DIAGNOSIS — M545 Low back pain: Principal | ICD-10-CM

## 2017-08-20 MED ORDER — IOPAMIDOL (ISOVUE-M 200) INJECTION 41%
1.0000 mL | Freq: Once | INTRAMUSCULAR | Status: AC
  Administered 2017-08-20: 1 mL via EPIDURAL

## 2017-08-20 MED ORDER — METHYLPREDNISOLONE ACETATE 40 MG/ML INJ SUSP (RADIOLOG
120.0000 mg | Freq: Once | INTRAMUSCULAR | Status: AC
  Administered 2017-08-20: 120 mg via EPIDURAL

## 2017-08-20 NOTE — Discharge Instructions (Signed)

## 2017-08-23 ENCOUNTER — Other Ambulatory Visit: Payer: BLUE CROSS/BLUE SHIELD

## 2017-09-03 ENCOUNTER — Other Ambulatory Visit: Payer: Self-pay | Admitting: Family Medicine

## 2017-09-03 DIAGNOSIS — E041 Nontoxic single thyroid nodule: Secondary | ICD-10-CM

## 2017-09-04 ENCOUNTER — Ambulatory Visit
Admission: RE | Admit: 2017-09-04 | Discharge: 2017-09-04 | Disposition: A | Discharge status: Home / Self Care | Payer: BLUE CROSS/BLUE SHIELD | Source: Ambulatory Visit | Attending: Family Medicine | Admitting: Family Medicine

## 2017-09-04 DIAGNOSIS — E041 Nontoxic single thyroid nodule: Secondary | ICD-10-CM

## 2017-09-12 ENCOUNTER — Other Ambulatory Visit: Payer: Self-pay | Admitting: Orthopedic Surgery

## 2017-09-12 DIAGNOSIS — M5136 Other intervertebral disc degeneration, lumbar region: Secondary | ICD-10-CM

## 2019-04-01 ENCOUNTER — Ambulatory Visit (INDEPENDENT_AMBULATORY_CARE_PROVIDER_SITE_OTHER): Payer: Medicare Other

## 2019-04-01 ENCOUNTER — Ambulatory Visit (INDEPENDENT_AMBULATORY_CARE_PROVIDER_SITE_OTHER): Payer: Medicare Other | Admitting: Family Medicine

## 2019-04-01 ENCOUNTER — Encounter: Payer: Self-pay | Admitting: Family Medicine

## 2019-04-01 ENCOUNTER — Other Ambulatory Visit: Payer: Self-pay

## 2019-04-01 DIAGNOSIS — M25561 Pain in right knee: Secondary | ICD-10-CM

## 2019-04-01 DIAGNOSIS — G8929 Other chronic pain: Secondary | ICD-10-CM

## 2019-04-01 DIAGNOSIS — M25512 Pain in left shoulder: Secondary | ICD-10-CM | POA: Diagnosis not present

## 2019-04-01 DIAGNOSIS — M25511 Pain in right shoulder: Secondary | ICD-10-CM

## 2019-04-01 MED ORDER — GABAPENTIN 100 MG PO CAPS: ORAL_CAPSULE | ORAL | 6 refills | Status: DC

## 2019-04-01 NOTE — Progress Notes (Signed)
Office Visit Note   Patient: Marissa [name]           Date of Birth: 09/03/1966           MRN: [id number] Visit Date: 04/01/2019 Requested by: Kelton Pillar, MD [address] [place],  [place] [address] PCP: Kelton Pillar, MD  Subjective: Chief Complaint  Patient presents with  . Left Shoulder - Pain    Pain x 3 months. Aches. Swells in axillary area (same as on right side). Decreased ROM due to pain. Cannot sleep well at night - wakes if lying on left side.    HPI: She is here to reestablish care.  Her main complaints are left greater than right shoulder pain and right knee pain.  Left shoulder started bothering her about 3 months ago with no injury.  Aching pain and swelling in the axillary region.  She had a similar problem with her right shoulder a few years ago and eventually had a CT scan showing avascular necrosis.  She has been managing her pain with Tylenol and ibuprofen.  Left shoulder pain feels almost identical.  She has not had any trauma to her shoulder.  She has a history of chronic right knee troubles in the past, we have aspirated and injected her knee before.  It is not swelling like it was, but it gives way sometimes.  Unfortunately her father passed away in 2013/12/19 from leukemia.                ROS: Denies fevers or chills.  All other systems were reviewed and are negative.  Objective: Vital Signs: LMP 07/23/2002   Physical Exam:  General:  Alert and oriented, in no acute distress. Pulm:  Breathing unlabored. Psy:  Normal mood, congruent affect. Skin: No rash on her skin. Shoulders: She still is able to reach completely overhead.  Behind the back reach is slightly limited.  Isometric rotator cuff strength is 5/5 throughout but she does have some pain with empty can test.  Left shoulder hurts with passive abduction and external rotation at the extreme.  No tenderness at the [facility] joints. Right knee: No effusion today, 1+ patellofemoral crepitus.   Very tender on the medial joint line but no palpable click on McMurray's.   Imaging: X-rays left shoulder: There is a focus of AVN in the humeral head, no sign of collapse.  Dr. Erlinda Hong reviewed the films as well.  X-rays right shoulder: Similar findings to the left shoulder with a focus of AVN in the humeral head, no sign of collapse.  X-rays right knee: Bilateral AVN at the femoral condyles and tibial plateau.  No obvious collapse, well-preserved joint spaces still.    Assessment & Plan: 1.  Left greater than right shoulder pain most likely due to AVN of the humeral heads. -Trial of gabapentin.  Could contemplate physical therapy. Dextrose prolotherapy would be a consideration but I doubt it would provide that much relief.  If she starts showing signs of collapse in the future, then surgical intervention.  2.  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  Follow-up as needed for worsening symptoms.     Procedures: No procedures performed  No notes on file     PMFS History: Patient Active Problem List   Diagnosis Date Noted  . BV (bacterial vaginosis) 01/01/2013  . Anal fissure 07/07/2012  . ALL (acute lymphoid leukemia) in remission ([place]) 01/21/2012  .  Endometriosis 01/21/2012  . Obesity 11/28/2011  . Multinodular goiter (nontoxic), dominant nodule left lobe 07/18/2011   Past Medical History:  Diagnosis Date  . Abdominal pain    INTERMITANT  . Anemia   . Asthma   . GERD (gastroesophageal reflux disease)   . H/O hiatal hernia   . Hemorrhoids   . History of transfusion   . IBS (irritable bowel syndrome)   . Leukemia ([place])    as a child, acute lymphatic with T cell  . Plantar fasciitis   . Plantar fasciitis of left foot FALL 2012   HAD SURGERY TO FIX IT  . Prediabetes   . Rectal pain   . 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 Thyroid nodule     Family History  Problem Relation Age of Onset   . Cancer Mother 27       LUNG  . Diabetes Mother   . Diabetes Father   . Stroke Father   . Heart disease Father   . Vision loss Father        GLAUCOMA  . Cancer Maternal Aunt        breast  . Diverticulosis Unknown   . Breast cancer Neg Hx     Past Surgical History:  Procedure Laterality Date  . ABDOMINAL HYSTERECTOMY  2003   BSO  . BIOPSY THYROID  11/2009   benign  . carpel tunnel  2004   rt hand  . COLONOSCOPY WITH PROPOFOL N/A 11/08/2016   Procedure: COLONOSCOPY WITH PROPOFOL;  Surgeon: Ronald Lobo, MD;  Location: [facility];  Service: Endoscopy;  Laterality: N/A;  . eye biospy  1979   dx with leukemia from this biospy  . eye biospy  1979   rt eye, dx: leukemia  . EYE SURGERY  1982   rt eye  . NASAL SINUS SURGERY  1999  . PLANTAR FASCIA SURGERY  2004, 2012   rt foot, left foot  . RADICAL HYSTERECTOMY    . skin cancer removal on shoulder  2/14  . THYROID LOBECTOMY Left 09/25/2013   Procedure: LEFT THYROID LOBECTOMY;  Surgeon: Earnstine Regal, MD;  Location: [facility] ORS;  Service: General;  Laterality: Left;   Social History   Occupational History  . Not on file  Tobacco Use  . Smoking status: Former Smoker    Quit date: 09/15/1991    Years since quitting: 27.5  . Smokeless tobacco: Never Used  Substance and Sexual Activity  . Alcohol use: Yes    Alcohol/week: 0.0 standard drinks    Comment: rarely  . Drug use: No  . Sexual activity: Never    Partners: Male    Birth control/protection: Surgical    Comment: hyst.....Marland KitchenINTERCOURSE AGE 53, SEXUAL PARTNERS LESS THAN 5

## 2019-04-17 ENCOUNTER — Telehealth: Payer: Self-pay | Admitting: Family Medicine

## 2019-04-17 NOTE — Telephone Encounter (Signed)
Patient called, c/o dysuria and frequency of urination. This started today. No fever, some chills. No nausea/vomiting. Allergic to [facility], PCN & sulfa. She says she had a UTI earlier this year and was prescribed Cipro, which helped. She is asking if she can have this called in to [facility]. She said she has been getting UTIs more often and has urinary incontinence -- she has a CPE next month here and would like this to be followed up then.

## 2019-04-17 NOTE — Telephone Encounter (Signed)
Patient called advised she has a UTI and asked if Dr Junius Roads will prescribe Cipro for her. The number to contact patient is [phone]

## 2019-04-19 MED ORDER — CIPROFLOXACIN HCL 500 MG PO TABS: 500.0000 mg | ORAL_TABLET | Freq: Two times a day (BID) | ORAL | 0 refills | Status: DC

## 2019-04-19 NOTE — Telephone Encounter (Signed)
Rx sent 

## 2019-04-20 ENCOUNTER — Ambulatory Visit: Payer: Self-pay | Admitting: Gynecology

## 2019-04-20 ENCOUNTER — Other Ambulatory Visit: Payer: Self-pay

## 2019-04-20 NOTE — Telephone Encounter (Signed)
I advised the patient the Cipro was sent in yesterday pm, so it should be ready for her to pick up today. Advised her to let us know if she does not improve or worsens.

## 2019-05-07 ENCOUNTER — Ambulatory Visit: Payer: Medicare Other | Admitting: Family Medicine

## 2019-05-08 ENCOUNTER — Ambulatory Visit (INDEPENDENT_AMBULATORY_CARE_PROVIDER_SITE_OTHER): Payer: Medicare Other | Admitting: Family Medicine

## 2019-05-08 ENCOUNTER — Encounter: Payer: Self-pay | Admitting: Family Medicine

## 2019-05-08 VITALS — BP 134/69 | HR 89 | Temp 98.1°F | Resp 16 | Ht 63.25 in | Wt 329.6 lb

## 2019-05-08 DIAGNOSIS — E042 Nontoxic multinodular goiter: Secondary | ICD-10-CM | POA: Diagnosis not present

## 2019-05-08 DIAGNOSIS — C9101 Acute lymphoblastic leukemia, in remission: Secondary | ICD-10-CM

## 2019-05-08 DIAGNOSIS — Z0000 Encounter for general adult medical examination without abnormal findings: Secondary | ICD-10-CM

## 2019-05-08 DIAGNOSIS — K219 Gastro-esophageal reflux disease without esophagitis: Secondary | ICD-10-CM | POA: Diagnosis not present

## 2019-05-08 DIAGNOSIS — J4520 Mild intermittent asthma, uncomplicated: Secondary | ICD-10-CM | POA: Insufficient documentation

## 2019-05-08 DIAGNOSIS — N809 Endometriosis, unspecified: Secondary | ICD-10-CM | POA: Diagnosis not present

## 2019-05-08 DIAGNOSIS — R32 Unspecified urinary incontinence: Secondary | ICD-10-CM | POA: Insufficient documentation

## 2019-05-08 DIAGNOSIS — Z1231 Encounter for screening mammogram for malignant neoplasm of breast: Secondary | ICD-10-CM

## 2019-05-08 DIAGNOSIS — R739 Hyperglycemia, unspecified: Secondary | ICD-10-CM | POA: Insufficient documentation

## 2019-05-08 DIAGNOSIS — Z86010 Personal history of colonic polyps: Secondary | ICD-10-CM | POA: Insufficient documentation

## 2019-05-08 DIAGNOSIS — Z6841 Body Mass Index (BMI) 40.0 and over, adult: Secondary | ICD-10-CM

## 2019-05-08 MED ORDER — CYCLOBENZAPRINE HCL 10 MG PO TABS: 10.0000 mg | ORAL_TABLET | Freq: Three times a day (TID) | ORAL | 3 refills | Status: DC | PRN

## 2019-05-08 MED ORDER — NYSTATIN-TRIAMCINOLONE 100000-0.1 UNIT/GM-% EX OINT: 1.0000 "application " | TOPICAL_OINTMENT | Freq: Two times a day (BID) | CUTANEOUS | 6 refills | Status: DC

## 2019-05-08 NOTE — Progress Notes (Signed)
Office Visit Note   Patient: Marissa [name]           Date of Birth: Aug 21, 1966           MRN: [id number] Visit Date: 05/08/2019 Requested by: Kelton Pillar, MD [address] [place],  [place] [address] PCP: Eunice Blase, MD  Subjective: Chief Complaint  Patient presents with   Annual Exam    HPI: She is here for annual wellness exam.  Still having some shoulder pain but it is much more tolerable now.  Asthma has been well controlled with medication.  She takes proton pump inhibitor for GERD.  She has a history of multinodular goiter and had partial thyroidectomy.  She is on Synthroid.  She has a history of hyperglycemia.  She thinks that she might be diabetic now, her fasting blood sugars have been in the upper 100s lately.  She has chronic urinary incontinence.  Now that she has insurance she would like to consult with a neurologist.  She has a history of benign colon polyps, her last colonoscopy was 2018 and she is due for another one in 1 to 2 years.  She has a history of a LL in remission.  She has super morbid obesity and is trying to lose weight.                 ROS: Denies fevers or chills.  All other systems were negative.  Health Maintenance: Colonoscopy: 2018 Immunizations: Up-to-date Pap/Mammogram: A couple years ago Dentist: Scheduled next month. Eye: Scheduled in October.   Objective: Vital Signs: BP 134/69 (BP Location: Left Arm, Patient Position: Sitting, Cuff Size: Large)    Pulse 89    Temp 98.1 F (36.7 C)    Resp 16    Ht 5' 3.25" (1.607 m)    Wt (!) 329 lb 9.6 oz (149.5 kg)    LMP 07/23/2002    BMI 57.93 kg/m   Physical Exam:  HEENT:  [place]/AT, PERRLA, EOM Full, no nystagmus.  Funduscopic examination within normal limits.  No conjunctival erythema.  Tympanic membranes are pearly gray with normal landmarks.  External ear canals are normal.  Nasal passages are clear.  Oropharynx is clear.  No significant lymphadenopathy.  No  thyromegaly or nodules.  2+ carotid pulses without bruits. CV: Regular rate and rhythm without murmurs, rubs, or gallops.  No peripheral edema.  2+ radial and posterior tibial pulses. Lungs: Clear to auscultation throughout with no wheezing or areas of consolidation. Abdomen: Protuberant, nontender.   Imaging: None today.  Assessment & Plan: 1.  Wellness examination -Labs to monitor.  2.  Super morbid obesity -Working on weight loss.  We will adjust thyroid medication if indicated.  3.  Goiter -Labs to evaluate.  4.  Hyperglycemia, possibly diabetic -Labs to evaluate.  5.  Asthma, well controlled.  6.  History of colon polyps -Colonoscopy in 1 to 2 years.  7.  Urinary incontinence -Urology consult.   Follow-Up Instructions: Return in about 6 months (around 11/08/2019).     Procedures: None today.   PMFS History: Patient Active Problem List   Diagnosis Date Noted   Gastroesophageal reflux disease 05/08/2019   Hyperglycemia 05/08/2019   Urinary incontinence 05/08/2019   History of colon polyps 05/08/2019   Mild intermittent asthma without complication 30/05/2329   BV (bacterial vaginosis) 01/01/2013   Anal fissure 07/07/2012   ALL (acute lymphoid leukemia) in remission ([place]) 01/21/2012   Endometriosis 01/21/2012   Class 3 severe  obesity without serious comorbidity with body mass index (BMI) of 50.0 to 59.9 in adult ([place]) 11/28/2011   Multinodular goiter (nontoxic), dominant nodule left lobe 07/18/2011   Past Medical History:  Diagnosis Date   Abdominal pain    INTERMITANT   Anemia    Asthma    GERD (gastroesophageal reflux disease)    H/O hiatal hernia    Hemorrhoids    History of transfusion    IBS (irritable bowel syndrome)    Leukemia ([place])    as a child, acute lymphatic with T cell   Plantar fasciitis    Plantar fasciitis of left foot FALL 2012   HAD SURGERY TO FIX IT   Prediabetes    Rectal pain    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   Thyroid nodule     Family History  Problem Relation Age of Onset   Cancer Mother 40       LUNG   Diabetes Mother    Diabetes Father    Stroke Father    Heart disease Father    Vision loss Father        GLAUCOMA   Cancer Maternal Aunt        breast   Breast cancer Maternal Aunt    Diverticulosis Other    Colon cancer Neg Hx     Past Surgical History:  Procedure Laterality Date   ABDOMINAL HYSTERECTOMY  2003   BSO   BIOPSY THYROID  11/2009   benign   carpel tunnel  2004   rt hand   COLONOSCOPY WITH PROPOFOL N/A 11/08/2016   Procedure: COLONOSCOPY WITH PROPOFOL;  Surgeon: Ronald Lobo, MD;  Location: [facility];  Service: Endoscopy;  Laterality: N/A;   eye biospy  1979   dx with leukemia from this biospy   eye biospy  1979   rt eye, dx: leukemia   EYE SURGERY  1982   rt eye   NASAL SINUS SURGERY  1999   PLANTAR FASCIA SURGERY  2004, 2012   rt foot, left foot   RADICAL HYSTERECTOMY     skin cancer removal on shoulder  2/14   THYROID LOBECTOMY Left 09/25/2013   Procedure: LEFT THYROID LOBECTOMY;  Surgeon: Earnstine Regal, MD;  Location: [facility];  Service: General;  Laterality: Left;   Social History   Occupational History   Not on file  Tobacco Use   Smoking status: Former Smoker    Quit date: 09/15/1991    Years since quitting: 27.6   Smokeless tobacco: Never Used  Substance and Sexual Activity   Alcohol use: Yes    Alcohol/week: 0.0 standard drinks    Comment: rarely   Drug use: No   Sexual activity: Never    Partners: Male    Birth control/protection: Surgical    Comment: hyst.....Marland KitchenINTERCOURSE AGE 43, SEXUAL PARTNERS LESS THAN 5

## 2019-05-09 ENCOUNTER — Telehealth: Payer: Self-pay | Admitting: Family Medicine

## 2019-05-09 LAB — CBC WITH DIFFERENTIAL/PLATELET
Absolute Monocytes: 490 cells/uL (ref 200–950)
Basophils Absolute: 23 cells/uL (ref 0–200)
Basophils Relative: 0.4 %
Eosinophils Absolute: 120 cells/uL (ref 15–500)
Eosinophils Relative: 2.1 %
HCT: 40.3 % (ref 35.0–45.0)
Hemoglobin: 13.5 g/dL (ref 11.7–15.5)
Lymphs Abs: 1727 cells/uL (ref 850–3900)
MCH: 31 pg (ref 27.0–33.0)
MCHC: 33.5 g/dL (ref 32.0–36.0)
MCV: 92.6 fL (ref 80.0–100.0)
MPV: 11.4 fL (ref 7.5–12.5)
Monocytes Relative: 8.6 %
Neutro Abs: 3340 cells/uL (ref 1500–7800)
Neutrophils Relative %: 58.6 %
Platelets: 90 10*3/uL — ABNORMAL LOW (ref 140–400)
RBC: 4.35 10*6/uL (ref 3.80–5.10)
RDW: 12.4 % (ref 11.0–15.0)
Total Lymphocyte: 30.3 %
WBC: 5.7 10*3/uL (ref 3.8–10.8)

## 2019-05-09 LAB — COMPREHENSIVE METABOLIC PANEL
AG Ratio: 1.4 (calc) (ref 1.0–2.5)
ALT: 13 U/L (ref 6–29)
AST: 21 U/L (ref 10–35)
Albumin: 3.7 g/dL (ref 3.6–5.1)
Alkaline phosphatase (APISO): 121 U/L (ref 37–153)
BUN: 12 mg/dL (ref 7–25)
CO2: 30 mmol/L (ref 20–32)
Calcium: 9 mg/dL (ref 8.6–10.4)
Chloride: 104 mmol/L (ref 98–110)
Creat: 0.65 mg/dL (ref 0.50–1.05)
Globulin: 2.7 g/dL (calc) (ref 1.9–3.7)
Glucose, Bld: 174 mg/dL — ABNORMAL HIGH (ref 65–99)
Potassium: 4.3 mmol/L (ref 3.5–5.3)
Sodium: 141 mmol/L (ref 135–146)
Total Bilirubin: 0.8 mg/dL (ref 0.2–1.2)
Total Protein: 6.4 g/dL (ref 6.1–8.1)

## 2019-05-09 LAB — URINALYSIS W MICROSCOPIC + REFLEX CULTURE
Bacteria, UA: NONE SEEN /HPF
Bilirubin Urine: NEGATIVE
Glucose, UA: NEGATIVE
Hgb urine dipstick: NEGATIVE
Hyaline Cast: NONE SEEN /LPF
Ketones, ur: NEGATIVE
Leukocyte Esterase: NEGATIVE
Nitrites, Initial: NEGATIVE
Protein, ur: NEGATIVE
RBC / HPF: NONE SEEN /HPF (ref 0–2)
Specific Gravity, Urine: 1.021 (ref 1.001–1.03)
pH: 6 (ref 5.0–8.0)

## 2019-05-09 LAB — LIPID PANEL
Cholesterol: 198 mg/dL (ref <200)
HDL: 76 mg/dL (ref >=50)
LDL Cholesterol (Calc): 101 mg/dL (calc) — ABNORMAL HIGH
Non-HDL Cholesterol (Calc): 122 mg/dL (calc) (ref <130)
Total CHOL/HDL Ratio: 2.6 (calc) (ref <5.0)
Triglycerides: 112 mg/dL (ref <150)

## 2019-05-09 LAB — HEMOGLOBIN A1C
Hgb A1c MFr Bld: 6.9 % of total Hgb — ABNORMAL HIGH (ref <5.7)
Mean Plasma Glucose: 151 (calc)
eAG (mmol/L): 8.4 (calc)

## 2019-05-09 LAB — NO CULTURE INDICATED

## 2019-05-09 LAB — THYROID PANEL WITH TSH
Free Thyroxine Index: 3.5 (ref 1.4–3.8)
T3 Uptake: 27 % (ref 22–35)
T4, Total: 12.9 ug/dL — ABNORMAL HIGH (ref 5.1–11.9)
TSH: 2.75 mIU/L

## 2019-05-09 MED ORDER — METFORMIN HCL 500 MG PO TABS: 500.0000 mg | ORAL_TABLET | Freq: Two times a day (BID) | ORAL | 6 refills | Status: DC

## 2019-05-09 MED ORDER — LEVOTHYROXINE SODIUM 75 MCG PO TABS: 75.0000 ug | ORAL_TABLET | Freq: Every day | ORAL | 1 refills | Status: DC

## 2019-05-09 NOTE — Telephone Encounter (Signed)
A1C and glucose have definitely tipped over into diabetes range.  Would suggest starting metformin.  It may help with weight loss too.  Recheck in 3-4 months.  Thyroid TSH is good, but perfect would be in the 0.5-1.0 range.  Since we're trying to optimize weight loss, will increase synthroid dosage to 75 mcg daily and we'll recheck in 3-4 months.  All else looks good.

## 2019-05-11 ENCOUNTER — Telehealth: Payer: Self-pay | Admitting: Family Medicine

## 2019-05-11 NOTE — Telephone Encounter (Signed)
I advised patient of both. Scheduled labwork appointment for 06/19/19 with me and she will keep her scheduled appointment with the urologist.

## 2019-05-11 NOTE — Telephone Encounter (Signed)
Patient called asked for a call back concerning the results of her blood work. Patient also asked if it is ok to make an appointment with a neurologist in [place] or does she have to go the the provider he referred her to? The number to contact patient is [phone]

## 2019-05-11 NOTE — Telephone Encounter (Signed)
I called the patient: #1) She noticed her platelets are a little low and wondered if this is due to any of her medications. #2) She called to schedule an appointment for the urologist in [place], and was given an appointment with Dr. Matilde Sprang with [facility] in [place] for 05/27/2019. Is this ok, or did you have a specific urologist in mind? OK to call or send MyChart message with response.

## 2019-05-11 NOTE — Telephone Encounter (Signed)
1.  Yes, lots of medicines can affect platelets.  But before we make any changes, would recheck the CBC in 4-6 weeks.  2.  Dr. Matilde Sprang is very good with this problem.  But if she prefers to go elsewhere, that's ok with me.

## 2019-06-17 ENCOUNTER — Encounter: Payer: Self-pay | Admitting: Gynecology

## 2019-06-19 ENCOUNTER — Ambulatory Visit: Payer: Medicare Other

## 2019-06-22 ENCOUNTER — Ambulatory Visit
Admission: RE | Admit: 2019-06-22 | Discharge: 2019-06-22 | Disposition: A | Discharge status: Home / Self Care | Payer: Medicare Other | Source: Ambulatory Visit | Attending: Family Medicine | Admitting: Family Medicine

## 2019-06-22 ENCOUNTER — Other Ambulatory Visit: Payer: Self-pay

## 2019-06-22 DIAGNOSIS — Z1231 Encounter for screening mammogram for malignant neoplasm of breast: Secondary | ICD-10-CM

## 2019-06-22 DIAGNOSIS — Z0000 Encounter for general adult medical examination without abnormal findings: Secondary | ICD-10-CM

## 2019-06-23 ENCOUNTER — Telehealth: Payer: Self-pay | Admitting: Family Medicine

## 2019-06-23 ENCOUNTER — Ambulatory Visit (INDEPENDENT_AMBULATORY_CARE_PROVIDER_SITE_OTHER): Payer: Medicare Other

## 2019-06-23 DIAGNOSIS — C9101 Acute lymphoblastic leukemia, in remission: Secondary | ICD-10-CM

## 2019-06-23 LAB — CBC WITH DIFFERENTIAL/PLATELET
Absolute Monocytes: 545 cells/uL (ref 200–950)
Basophils Absolute: 21 cells/uL (ref 0–200)
Basophils Relative: 0.3 %
Eosinophils Absolute: 179 cells/uL (ref 15–500)
Eosinophils Relative: 2.6 %
HCT: 39.2 % (ref 35.0–45.0)
Hemoglobin: 13.2 g/dL (ref 11.7–15.5)
Lymphs Abs: 2146 cells/uL (ref 850–3900)
MCH: 31.1 pg (ref 27.0–33.0)
MCHC: 33.7 g/dL (ref 32.0–36.0)
MCV: 92.5 fL (ref 80.0–100.0)
MPV: 10.9 fL (ref 7.5–12.5)
Monocytes Relative: 7.9 %
Neutro Abs: 4009 cells/uL (ref 1500–7800)
Neutrophils Relative %: 58.1 %
Platelets: 137 10*3/uL — ABNORMAL LOW (ref 140–400)
RBC: 4.24 10*6/uL (ref 3.80–5.10)
RDW: 12.8 % (ref 11.0–15.0)
Total Lymphocyte: 31.1 %
WBC: 6.9 10*3/uL (ref 3.8–10.8)

## 2019-06-23 NOTE — Progress Notes (Signed)
Repeat CBC/diff drawn per Dr. Junius Roads.

## 2019-06-23 NOTE — Telephone Encounter (Signed)
Mammogram was normal.   

## 2019-06-24 ENCOUNTER — Telehealth: Payer: Self-pay | Admitting: Family Medicine

## 2019-06-24 NOTE — Telephone Encounter (Signed)
CBC almost normal now.

## 2019-07-01 ENCOUNTER — Encounter: Payer: Self-pay | Admitting: Family Medicine

## 2019-07-01 LAB — HM DIABETES EYE EXAM

## 2019-07-16 ENCOUNTER — Other Ambulatory Visit: Payer: Self-pay

## 2019-07-17 ENCOUNTER — Ambulatory Visit (INDEPENDENT_AMBULATORY_CARE_PROVIDER_SITE_OTHER): Payer: Medicare Other | Admitting: Obstetrics & Gynecology

## 2019-07-17 ENCOUNTER — Encounter: Payer: Self-pay | Admitting: Obstetrics & Gynecology

## 2019-07-17 VITALS — BP 126/78 | Ht 63.0 in | Wt 332.0 lb

## 2019-07-17 DIAGNOSIS — B372 Candidiasis of skin and nail: Secondary | ICD-10-CM

## 2019-07-17 DIAGNOSIS — Z6841 Body Mass Index (BMI) 40.0 and over, adult: Secondary | ICD-10-CM | POA: Diagnosis not present

## 2019-07-17 DIAGNOSIS — Z780 Asymptomatic menopausal state: Secondary | ICD-10-CM | POA: Diagnosis not present

## 2019-07-17 DIAGNOSIS — Z01419 Encounter for gynecological examination (general) (routine) without abnormal findings: Secondary | ICD-10-CM | POA: Diagnosis not present

## 2019-07-17 DIAGNOSIS — Z90722 Acquired absence of ovaries, bilateral: Secondary | ICD-10-CM

## 2019-07-17 DIAGNOSIS — Z90710 Acquired absence of both cervix and uterus: Secondary | ICD-10-CM | POA: Diagnosis not present

## 2019-07-17 MED ORDER — NYSTATIN 100000 UNIT/GM EX POWD: Freq: Three times a day (TID) | CUTANEOUS | 4 refills | Status: DC

## 2019-07-17 NOTE — Patient Instructions (Signed)
1. Well female exam with routine gynecological exam Gynecologic exam status post TAH/BSO, limited by morbid obesity.  Pap test in 2014 was negative, status post total hysterectomy and abstinent, no indication to repeat the Pap test.  Breast exam normal.  Screening mammogram September 2020 was negative.  Colonoscopy in 2018.  Health labs with family physician.  2. Postmenopause Well on no hormone replacement therapy.  3. S/P TAH-BSO  4. Yeast infection of the skin Will treat with nystatin powder.  Usage reviewed and prescription sent to pharmacy.  5. Class 3 severe obesity due to excess calories with serious comorbidity and body mass index (BMI) of 50.0 to 59.9 in adult [facility]) Recommend a lower calorie/carb diet such as [employer].  Increase walking.  Other orders - mirabegron ER (MYRBETRIQ) 25 MG TB24 tablet; Take 25 mg by mouth daily. - nystatin (MYCOSTATIN/NYSTOP) powder; Apply topically 3 (three) times daily.  Marissa [name], it was a pleasure meeting you today!

## 2019-07-17 NOTE — Progress Notes (Signed)
Marissa [name] 29-Apr-1966 [id number]   History:    53 y.o. G0 Single  RP:  Established patient presenting for annual gyn exam   HPI: S/P TAH/BSO.  Postmenopausal with no hot flashes or night sweats.  No pelvic pain.  Abstinent for more than 10 years.  Urge incontinence on medication, followed by Dr. Matilde Sprang.  Bowel movements normal.  Breasts normal.  Morbid obesity with a body mass index at 58.81.  Not physically active.  On disability for chronic lower back pain and hip pains.  Diabetes mellitus type 2.  Health labs with family physician.  Past medical history,surgical history, family history and social history were all reviewed and documented in the EPIC chart.  Gynecologic History Patient's last menstrual period was 07/23/2002. Contraception: status post hysterectomy Last Pap: 2014. Results were: Neg Last mammogram: 05/2019. Results were: Negative Bone Density: 2012 Normal Colonoscopy: 2018  Obstetric History OB History  Gravida Para Term Preterm AB Living  0 0          SAB TAB Ectopic Multiple Live Births                ROS: A ROS was performed and pertinent positives and negatives are included in the history.  GENERAL: No fevers or chills. HEENT: No change in vision, no earache, sore throat or sinus congestion. NECK: No pain or stiffness. CARDIOVASCULAR: No chest pain or pressure. No palpitations. PULMONARY: No shortness of breath, cough or wheeze. GASTROINTESTINAL: No abdominal pain, nausea, vomiting or diarrhea, melena or bright red blood per rectum. GENITOURINARY: No urinary frequency, urgency, hesitancy or dysuria. MUSCULOSKELETAL: No joint or muscle pain, no back pain, no recent trauma. DERMATOLOGIC: No rash, no itching, no lesions. ENDOCRINE: No polyuria, polydipsia, no heat or cold intolerance. No recent change in weight. HEMATOLOGICAL: No anemia or easy bruising or bleeding. NEUROLOGIC: No headache, seizures, numbness, tingling or weakness. PSYCHIATRIC: No depression,  no loss of interest in normal activity or change in sleep pattern.     Exam:   BP 126/78   Ht 5\' 3"  (1.6 m)   Wt (!) 332 lb (150.6 kg)   LMP 07/23/2002   BMI 58.81 kg/m   Body mass index is 58.81 kg/m.  General appearance : Well developed well nourished female. No acute distress HEENT: Eyes: no retinal hemorrhage or exudates,  Neck supple, trachea midline, no carotid bruits, no thyroidmegaly Lungs: Clear to auscultation, no rhonchi or wheezes, or rib retractions  Heart: Regular rate and rhythm, no murmurs or gallops Breast:Examined in sitting and supine position were symmetrical in appearance, no palpable masses or tenderness,  no skin retraction, no nipple inversion, no nipple discharge, no skin discoloration, no axillary or supraclavicular lymphadenopathy Abdomen: no palpable masses or tenderness, no rebound or guarding Extremities: no edema or skin discoloration or tenderness  Pelvic: Vulva: Normal             Vagina: No gross lesions or discharge  Cervix/absent  Adnexa  Without masses or tenderness  Anus: Normal    Assessment/Plan:  53 y.o. female for annual exam   1. Well female exam with routine gynecological exam Gynecologic exam status post TAH/BSO, limited by morbid obesity.  Pap test in 2014 was negative, status post total hysterectomy and abstinent, no indication to repeat the Pap test.  Breast exam normal.  Screening mammogram September 2020 was negative.  Colonoscopy in 2018.  Health labs with family physician.  2. Postmenopause Well on no hormone replacement therapy.  3. S/P  TAH-BSO  4. Yeast infection of the skin Will treat with nystatin powder.  Usage reviewed and prescription sent to pharmacy.  5. Class 3 severe obesity due to excess calories with serious comorbidity and body mass index (BMI) of 50.0 to 59.9 in adult [facility]) Recommend a lower calorie/carb diet such as [employer].  Increase walking.  Other orders - mirabegron ER (MYRBETRIQ) 25 MG  TB24 tablet; Take 25 mg by mouth daily. - nystatin (MYCOSTATIN/NYSTOP) powder; Apply topically 3 (three) times daily.  Princess Bruins MD, 11:15 AM 07/17/2019

## 2019-07-20 ENCOUNTER — Telehealth: Payer: Self-pay | Admitting: *Deleted

## 2019-07-20 DIAGNOSIS — Z13820 Encounter for screening for osteoporosis: Secondary | ICD-10-CM

## 2019-07-20 NOTE — Telephone Encounter (Signed)
Patient was seen on 07/17/19, was told to schedule Dexa, no order was placed. I just want to confirm she needs dexa? Last dexa per chart was 2012. Please advise

## 2019-07-21 NOTE — Telephone Encounter (Signed)
Yes, please order and schedule Dexa.

## 2019-07-22 ENCOUNTER — Other Ambulatory Visit: Payer: Self-pay

## 2019-07-22 NOTE — Telephone Encounter (Signed)
Order placed

## 2019-07-27 ENCOUNTER — Other Ambulatory Visit: Payer: Self-pay

## 2019-07-28 ENCOUNTER — Ambulatory Visit (INDEPENDENT_AMBULATORY_CARE_PROVIDER_SITE_OTHER): Payer: Medicare Other

## 2019-07-28 ENCOUNTER — Other Ambulatory Visit: Payer: Self-pay | Admitting: Obstetrics & Gynecology

## 2019-07-28 DIAGNOSIS — M8589 Other specified disorders of bone density and structure, multiple sites: Secondary | ICD-10-CM

## 2019-07-28 DIAGNOSIS — Z780 Asymptomatic menopausal state: Secondary | ICD-10-CM | POA: Diagnosis not present

## 2019-07-28 DIAGNOSIS — Z13820 Encounter for screening for osteoporosis: Secondary | ICD-10-CM

## 2019-09-04 ENCOUNTER — Other Ambulatory Visit: Payer: Self-pay | Admitting: Family Medicine

## 2019-09-04 MED ORDER — LEVOTHYROXINE SODIUM 75 MCG PO TABS: 75.0000 ug | ORAL_TABLET | Freq: Every day | ORAL | 1 refills | Status: DC

## 2019-09-07 ENCOUNTER — Other Ambulatory Visit: Payer: Self-pay

## 2019-09-07 ENCOUNTER — Encounter: Payer: Self-pay | Admitting: Family Medicine

## 2019-09-07 ENCOUNTER — Ambulatory Visit (INDEPENDENT_AMBULATORY_CARE_PROVIDER_SITE_OTHER): Payer: Medicare Other | Admitting: Family Medicine

## 2019-09-07 VITALS — BP 122/74 | Ht 63.0 in | Wt 332.8 lb

## 2019-09-07 DIAGNOSIS — M85839 Other specified disorders of bone density and structure, unspecified forearm: Secondary | ICD-10-CM | POA: Insufficient documentation

## 2019-09-07 DIAGNOSIS — E042 Nontoxic multinodular goiter: Secondary | ICD-10-CM | POA: Diagnosis not present

## 2019-09-07 DIAGNOSIS — R739 Hyperglycemia, unspecified: Secondary | ICD-10-CM | POA: Diagnosis not present

## 2019-09-07 DIAGNOSIS — G8929 Other chronic pain: Secondary | ICD-10-CM

## 2019-09-07 DIAGNOSIS — E119 Type 2 diabetes mellitus without complications: Secondary | ICD-10-CM | POA: Diagnosis not present

## 2019-09-07 DIAGNOSIS — M25551 Pain in right hip: Secondary | ICD-10-CM

## 2019-09-07 DIAGNOSIS — M545 Low back pain: Secondary | ICD-10-CM

## 2019-09-07 DIAGNOSIS — M85832 Other specified disorders of bone density and structure, left forearm: Secondary | ICD-10-CM

## 2019-09-07 DIAGNOSIS — Z6841 Body Mass Index (BMI) 40.0 and over, adult: Secondary | ICD-10-CM

## 2019-09-07 NOTE — Progress Notes (Signed)
Office Visit Note   Patient: Marissa [name]           Date of Birth: [birth year]           MRN: [id number] Visit Date: 09/07/2019 Requested by: Eunice Blase, MD [place],  [place] [address] PCP: Eunice Blase, MD  Subjective: Chief Complaint  Patient presents with  . weight check  . repeat bloodwork?    HPI: She is here for monitoring of diabetes, hypothyroidism, obesity.  Since last visit she had a left forearm bone density test showing osteopenia with a T score of -2.1.  She is taking vitamin D3, magnesium and vitamin K2 for that.  She is not checking blood sugars.  Her thyroid medicine dosage was increased last visit.  Unfortunately she has gained a little bit of weight.  She is having troubles with chronic low back and right hip pain.  She has had epidural injections in the past.  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              ROS:   All other systems were reviewed and are negative.  Objective: Vital Signs: BP 122/74   Ht 5\' 3"  (1.6 m)   Wt (!) 332 lb 12.8 oz (151 kg)   LMP 07/23/2002   BMI 58.95 kg/m   Physical Exam:  General:  Alert and oriented, in no acute distress. Pulm:  Breathing unlabored. Psy:  Normal mood, congruent affect. Skin: Slight dryness of the skin in front of her right knee. Thyroid: Slight enlargement, slightly palpable nodule on left. CV: Regular rate and rhythm without murmurs, rubs, or gallops.  No peripheral edema.  2+ radial and posterior tibial pulses. Lungs: Clear to auscultation throughout with no wheezing or areas of consolidation.    Imaging: None today  Assessment & Plan: 1.  Diabetes -Labs to evaluate.  Follow-up in about 3 to 6 months.  2.  Hypothyroidism -Labs to evaluate.  Adjust if needed.  3.  Super morbid obesity -She is working on this.  4.  Chronic low back and right hip pain -We are trying to avoid steroids.  However, if her pain becomes severe she will notify me and I will request epidural injection and  possibly right hip injection.     Procedures: No procedures performed  No notes on file     PMFS History: Patient Active Problem List   Diagnosis Date Noted  . Diabetes ([place]) 09/07/2019  . Osteopenia of forearm 09/07/2019  . Gastroesophageal reflux disease 05/08/2019  . Hyperglycemia 05/08/2019  . Urinary incontinence 05/08/2019  . History of colon polyps 05/08/2019  . Mild intermittent asthma without complication [id number]  . BV (bacterial vaginosis) 01/01/2013  . Anal fissure 07/07/2012  . ALL (acute lymphoid leukemia) in remission ([place]) 01/21/2012  . Endometriosis 01/21/2012  . Class 3 severe obesity without serious comorbidity with body mass index (BMI) of 50.0 to 59.9 in adult ([place]) 11/28/2011  . Multinodular goiter (nontoxic), dominant nodule left lobe 07/18/2011   Past Medical History:  Diagnosis Date  . Abdominal pain    INTERMITANT  . Anemia   . Asthma   . GERD (gastroesophageal reflux disease)   . H/O hiatal hernia   . Hemorrhoids   . History of transfusion   . IBS (irritable bowel syndrome)   . Leukemia ([place])    as a child, acute lymphatic with T cell  . Plantar fasciitis   . Plantar fasciitis of left foot FALL 2012  HAD SURGERY TO FIX IT  . Prediabetes   . Rectal pain   . 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 Thyroid nodule     Family History  Problem Relation Age of Onset  . Cancer Mother 22       LUNG  . Diabetes Mother   . Diabetes Father   . Stroke Father   . Heart disease Father   . Vision loss Father        GLAUCOMA  . Cancer Maternal Aunt        breast  . Breast cancer Maternal Aunt   . Diverticulosis Other   . Colon cancer Neg Hx     Past Surgical History:  Procedure Laterality Date  . ABDOMINAL HYSTERECTOMY  2003   BSO  . BIOPSY THYROID  11/2009   benign  . carpel tunnel  2004   rt hand  . COLONOSCOPY WITH PROPOFOL N/A 11/08/2016   Procedure: COLONOSCOPY WITH PROPOFOL;  Surgeon: Ronald Lobo, MD;  [facility];  Service: Endoscopy;  Laterality: N/A;  . eye biospy  1979   dx with leukemia from this biospy  . eye biospy  1979   rt eye, dx: leukemia  . EYE SURGERY  1982   rt eye  . NASAL SINUS SURGERY  1999  . PLANTAR FASCIA SURGERY  2004, 2012   rt foot, left foot  . RADICAL HYSTERECTOMY    . skin cancer removal on shoulder  2/14  . THYROID LOBECTOMY Left 09/25/2013   Procedure: LEFT THYROID LOBECTOMY;  Surgeon: Earnstine Regal, MD;  Location: [facility];  Service: General;  Laterality: Left;   Social History   Occupational History  . Not on file  Tobacco Use  . Smoking status: Former Smoker    Quit date: 09/15/1991    Years since quitting: 27.9  . Smokeless tobacco: Never Used  Substance and Sexual Activity  . Alcohol use: Yes    Alcohol/week: 0.0 standard drinks    Comment: rarely  . Drug use: No  . Sexual activity: Never    Partners: Male    Birth control/protection: Surgical    Comment: hyst.....Marland KitchenINTERCOURSE AGE 38, SEXUAL PARTNERS LESS THAN 5

## 2019-09-08 ENCOUNTER — Telehealth: Payer: Self-pay | Admitting: Family Medicine

## 2019-09-08 LAB — BASIC METABOLIC PANEL
BUN: 9 mg/dL (ref 7–25)
CO2: 29 mmol/L (ref 20–32)
Calcium: 8.9 mg/dL (ref 8.6–10.4)
Chloride: 104 mmol/L (ref 98–110)
Creat: 0.67 mg/dL (ref 0.50–1.05)
Glucose, Bld: 151 mg/dL — ABNORMAL HIGH (ref 65–99)
Potassium: 4.2 mmol/L (ref 3.5–5.3)
Sodium: 142 mmol/L (ref 135–146)

## 2019-09-08 LAB — CBC WITH DIFFERENTIAL/PLATELET
Absolute Monocytes: 574 cells/uL (ref 200–950)
Basophils Absolute: 20 cells/uL (ref 0–200)
Basophils Relative: 0.3 %
Eosinophils Absolute: 211 cells/uL (ref 15–500)
Eosinophils Relative: 3.2 %
HCT: 37.7 % (ref 35.0–45.0)
Hemoglobin: 12.4 g/dL (ref 11.7–15.5)
Lymphs Abs: 1696 cells/uL (ref 850–3900)
MCH: 29.6 pg (ref 27.0–33.0)
MCHC: 32.9 g/dL (ref 32.0–36.0)
MCV: 90 fL (ref 80.0–100.0)
MPV: 11.4 fL (ref 7.5–12.5)
Monocytes Relative: 8.7 %
Neutro Abs: 4099 cells/uL (ref 1500–7800)
Neutrophils Relative %: 62.1 %
Platelets: 146 10*3/uL (ref 140–400)
RBC: 4.19 10*6/uL (ref 3.80–5.10)
RDW: 12.4 % (ref 11.0–15.0)
Total Lymphocyte: 25.7 %
WBC: 6.6 10*3/uL (ref 3.8–10.8)

## 2019-09-08 LAB — THYROID PANEL WITH TSH
Free Thyroxine Index: 3.5 (ref 1.4–3.8)
T3 Uptake: 25 % (ref 22–35)
T4, Total: 14 ug/dL — ABNORMAL HIGH (ref 5.1–11.9)
TSH: 2.23 mIU/L

## 2019-09-08 LAB — HEMOGLOBIN A1C
Hgb A1c MFr Bld: 5.6 % of total Hgb (ref <5.7)
Mean Plasma Glucose: 114 (calc)
eAG (mmol/L): 6.3 (calc)

## 2019-09-08 NOTE — Telephone Encounter (Signed)
Labs look good overall.  Hemoglobin A1c is back in normal range.  Fasting blood glucose is still in diabetes range at 151.  It remains important to minimize intake of processed carbohydrates including breads, pastas, cereals, sugars and sweets.  We should recheck in about 3 to 4 months.  Thyroid numbers look better.  No changes needed.

## 2019-09-10 ENCOUNTER — Telehealth: Payer: Self-pay | Admitting: Family Medicine

## 2019-09-10 DIAGNOSIS — G8929 Other chronic pain: Secondary | ICD-10-CM

## 2019-09-10 DIAGNOSIS — M545 Low back pain, unspecified: Secondary | ICD-10-CM

## 2019-09-10 MED ORDER — HYDROCODONE-ACETAMINOPHEN 5-325 MG PO TABS: 1.0000 | ORAL_TABLET | Freq: Two times a day (BID) | ORAL | 0 refills | Status: DC | PRN

## 2019-09-10 NOTE — Telephone Encounter (Signed)
Patient called asked if the Rx for Hydrocodone can be sent to the [facility]. Patient said Dr Junius Roads will have to write the Rx's from now on. Dr. Jiles Prows per patient has retired. The number to contact patient is [phone]

## 2019-09-10 NOTE — Telephone Encounter (Signed)
Please advise 

## 2019-09-10 NOTE — Telephone Encounter (Signed)
Rx sent 

## 2019-09-10 NOTE — Telephone Encounter (Signed)
Left message on the patient's voice mail, advising her Rx has been sent in to the pharmacy.

## 2019-10-15 ENCOUNTER — Telehealth: Payer: Self-pay | Admitting: Family Medicine

## 2019-10-15 NOTE — Telephone Encounter (Signed)
Patient called stated Gabapentin is not helping at all and she has discontinued taking it.  Please call patient to advise.  [phone]

## 2019-10-16 ENCOUNTER — Telehealth: Payer: Self-pay | Admitting: Family Medicine

## 2019-10-16 ENCOUNTER — Encounter: Payer: Self-pay | Admitting: Family Medicine

## 2019-10-16 MED ORDER — PREGABALIN 75 MG PO CAPS: ORAL_CAPSULE | ORAL | 3 refills | Status: DC

## 2019-10-16 NOTE — Telephone Encounter (Signed)
She has not tried Lyrica before, but is willing to try it. Now uses [facility].

## 2019-10-16 NOTE — Telephone Encounter (Signed)
Rx sent 

## 2019-10-16 NOTE — Telephone Encounter (Signed)
Has she tried Lyrica?  Sometimes it works better than gabapentin.

## 2019-10-16 NOTE — Telephone Encounter (Signed)
I called the patient. She says the Gabapentin does not help the lower back/hip. She went without it for a couple weeks to see if she could tell a difference between taking it and not - no difference. The patient is still not in favor of any steroid injections (ESI/hip injection). She said the pain in not "unbearable," and Tylenol/Ibuprofen help. Norco is used in rare occasions. Are there any other options?

## 2019-10-16 NOTE — Telephone Encounter (Signed)
Patient called in and stated refill was sent to wrong pharmacy. Patient would like medication [facility] [address]

## 2019-10-16 NOTE — Telephone Encounter (Signed)
I called the patient to let her know [facility]. She saw another doctor (specialist) yesterday - she will call there to see if they were the ones that sent the medication to the wrong pharmacy.

## 2019-10-22 ENCOUNTER — Telehealth: Payer: Self-pay | Admitting: Family Medicine

## 2019-10-22 NOTE — Telephone Encounter (Signed)
Medication spelling correction-Pantopraloze     Please see previous note

## 2019-10-22 NOTE — Telephone Encounter (Signed)
Patient called needing rx refilled (Oantopraloze). The number to contact patient is [phone]

## 2019-10-23 MED ORDER — PANTOPRAZOLE SODIUM 40 MG PO TBEC: 40.0000 mg | DELAYED_RELEASE_TABLET | Freq: Every day | ORAL | 3 refills | Status: DC

## 2019-10-23 NOTE — Telephone Encounter (Signed)
Sent!

## 2019-10-23 NOTE — Telephone Encounter (Signed)
Please advise 

## 2019-11-04 ENCOUNTER — Other Ambulatory Visit: Payer: Self-pay | Admitting: Family Medicine

## 2019-11-10 ENCOUNTER — Telehealth: Payer: Self-pay | Admitting: Family Medicine

## 2019-11-10 MED ORDER — NITROFURANTOIN MONOHYD MACRO 100 MG PO CAPS: 100.0000 mg | ORAL_CAPSULE | Freq: Two times a day (BID) | ORAL | 0 refills | Status: DC

## 2019-11-10 NOTE — Telephone Encounter (Signed)
Rx sent for macrobid

## 2019-11-10 NOTE — Telephone Encounter (Signed)
Please advise 

## 2019-11-10 NOTE — Telephone Encounter (Signed)
Pt called in stating she has a UTI and would like to know if Dr. Junius Roads would [facility] an antibiotic [facility] [address]  [phone]

## 2019-11-10 NOTE — Telephone Encounter (Signed)
I called and advised her. She will let us know if she does not improve or worsens.

## 2019-12-10 ENCOUNTER — Other Ambulatory Visit: Payer: Self-pay | Admitting: Family Medicine

## 2020-01-06 ENCOUNTER — Other Ambulatory Visit: Payer: Self-pay

## 2020-01-06 ENCOUNTER — Encounter: Payer: Self-pay | Admitting: Family Medicine

## 2020-01-06 ENCOUNTER — Ambulatory Visit: Payer: Medicare Other | Admitting: Family Medicine

## 2020-01-06 DIAGNOSIS — E66813 Obesity, class 3: Secondary | ICD-10-CM

## 2020-01-06 DIAGNOSIS — E119 Type 2 diabetes mellitus without complications: Secondary | ICD-10-CM | POA: Diagnosis not present

## 2020-01-06 DIAGNOSIS — R6889 Other general symptoms and signs: Secondary | ICD-10-CM

## 2020-01-06 DIAGNOSIS — B070 Plantar wart: Secondary | ICD-10-CM | POA: Diagnosis not present

## 2020-01-06 DIAGNOSIS — Z6841 Body Mass Index (BMI) 40.0 and over, adult: Secondary | ICD-10-CM

## 2020-01-06 MED ORDER — PREGABALIN 75 MG PO CAPS: ORAL_CAPSULE | ORAL | 3 refills | Status: DC

## 2020-01-06 NOTE — Progress Notes (Signed)
Office Visit Note   Patient: Marissa [name]           Date of Birth: June 27, 1966           MRN: [id number] Visit Date: 01/06/2020 Requested by: Eunice Blase, MD [address] [place],  [place] [address] PCP: Eunice Blase, MD  Subjective: No chief complaint on file.   HPI: She is here for follow-up.  From a bladder standpoint, she recently started a stimulator and is optimistic that it will help.  She is not checking blood sugars.  She has a wart on her left foot that has bothered her intermittently for about a year.  She denies any numbness in her feet.  She is gaining weight since last visit.  She admits to eating poorly, she drinks 2 sweetened soft drinks daily and eats a lot of sweets otherwise.  She continues to complain of multiple areas of pain.  Lyrica helps a little, but seems to make her drowsy.  However, she is interested in trying a higher dosage.  She had an episode of cold intolerance recently.  She also eats ice cubes on a regular basis.  She does not think she craves them necessarily, but she wanted me to know this.                 ROS: No fevers or chills, no chest pain or shortness of breath, no asthma exacerbations.  All other systems were reviewed and are negative.  Objective: Vital Signs: LMP 07/23/2002   Physical Exam:  General:  Alert and oriented, in no acute distress. Pulm:  Breathing unlabored. Psy:  Normal mood, congruent affect. Skin: No worrisome lesions on her feet.  There is what appears to be a blood blister on the plantar aspect of her left foot mid lateral arch. CV: Regular rate and rhythm without murmurs, rubs, or gallops.  No peripheral edema.  2+ radial and posterior tibial pulses. Lungs: Clear to auscultation throughout with no wheezing or areas of consolidation.    Imaging: None today  Assessment & Plan: 1.  Diabetes -Recheck labs today.  Strongly encouraged her to start focusing on diet.  This will help with her pain as well  as with her bladder troubles.  At a minimum, she needs to cut out soft drinks and a good portion of the sweets she is eating.  2.  Super morbid obesity -Dietary changes as above.  3.  Recent episode of cold intolerance -We will screen for anemia today.  4.  Left foot plantar lesion, possible wart. -Podiatry referral    Procedures: No procedures performed  No notes on file     PMFS History: Patient Active Problem List   Diagnosis Date Noted  . Diabetes ([place]) 09/07/2019  . Osteopenia of forearm 09/07/2019  . Gastroesophageal reflux disease 05/08/2019  . Hyperglycemia 05/08/2019  . Urinary incontinence 05/08/2019  . History of colon polyps 05/08/2019  . Mild intermittent asthma without complication [id number]  . BV (bacterial vaginosis) 01/01/2013  . Anal fissure 07/07/2012  . ALL (acute lymphoid leukemia) in remission ([place]) 01/21/2012  . Endometriosis 01/21/2012  . Class 3 severe obesity without serious comorbidity with body mass index (BMI) of 50.0 to 59.9 in adult ([place]) 11/28/2011  . Multinodular goiter (nontoxic), dominant nodule left lobe 07/18/2011   Past Medical History:  Diagnosis Date  . Abdominal pain    INTERMITANT  . Anemia   . Asthma   . GERD (gastroesophageal reflux disease)   . H/O  hiatal hernia   . Hemorrhoids   . History of transfusion   . IBS (irritable bowel syndrome)   . Leukemia ([place])    as a child, acute lymphatic with T cell  . Plantar fasciitis   . Plantar fasciitis of left foot FALL 2012   HAD SURGERY TO FIX IT  . Prediabetes   . Rectal pain   . 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 Thyroid nodule     Family History  Problem Relation Age of Onset  . Cancer Mother 54       LUNG  . Diabetes Mother   . Diabetes Father   . Stroke Father   . Heart disease Father   . Vision loss Father        GLAUCOMA  . Cancer Maternal Aunt        breast  . Breast cancer Maternal Aunt   . Diverticulosis Other   . Colon cancer Neg Hx       Past Surgical History:  Procedure Laterality Date  . ABDOMINAL HYSTERECTOMY  2003   BSO  . BIOPSY THYROID  11/2009   benign  . carpel tunnel  2004   rt hand  . COLONOSCOPY WITH PROPOFOL N/A 11/08/2016   Procedure: COLONOSCOPY WITH PROPOFOL;  Surgeon: Ronald Lobo, MD;  Location: [facility];  Service: Endoscopy;  Laterality: N/A;  . eye biospy  1979   dx with leukemia from this biospy  . eye biospy  1979   rt eye, dx: leukemia  . EYE SURGERY  1982   rt eye  . NASAL SINUS SURGERY  1999  . PLANTAR FASCIA SURGERY  2004, 2012   rt foot, left foot  . RADICAL HYSTERECTOMY    . skin cancer removal on shoulder  2/14  . THYROID LOBECTOMY Left 09/25/2013   Procedure: LEFT THYROID LOBECTOMY;  Surgeon: Earnstine Regal, MD;  Location: [facility];  Service: General;  Laterality: Left;   Social History   Occupational History  . Not on file  Tobacco Use  . Smoking status: Former Smoker    Quit date: 09/15/1991    Years since quitting: 28.3  . Smokeless tobacco: Never Used  Substance and Sexual Activity  . Alcohol use: Yes    Alcohol/week: 0.0 standard drinks    Comment: rarely  . Drug use: No  . Sexual activity: Never    Partners: Male    Birth control/protection: Surgical    Comment: hyst.....Marland KitchenINTERCOURSE AGE 1, SEXUAL PARTNERS LESS THAN 5

## 2020-01-07 ENCOUNTER — Telehealth: Payer: Self-pay | Admitting: Family Medicine

## 2020-01-07 LAB — IRON,TIBC AND FERRITIN PANEL
%SAT: 15 % (calc) — ABNORMAL LOW (ref 16–45)
Ferritin: 8 ng/mL — ABNORMAL LOW (ref 16–232)
Iron: 52 ug/dL (ref 45–160)
TIBC: 338 mcg/dL (calc) (ref 250–450)

## 2020-01-07 LAB — CBC WITH DIFFERENTIAL/PLATELET
Absolute Monocytes: 491 cells/uL (ref 200–950)
Basophils Absolute: 22 cells/uL (ref 0–200)
Basophils Relative: 0.4 %
Eosinophils Absolute: 103 cells/uL (ref 15–500)
Eosinophils Relative: 1.9 %
HCT: 33 % — ABNORMAL LOW (ref 35.0–45.0)
Hemoglobin: 10.1 g/dL — ABNORMAL LOW (ref 11.7–15.5)
Lymphs Abs: 1323 cells/uL (ref 850–3900)
MCH: 25.2 pg — ABNORMAL LOW (ref 27.0–33.0)
MCHC: 30.6 g/dL — ABNORMAL LOW (ref 32.0–36.0)
MCV: 82.3 fL (ref 80.0–100.0)
MPV: 11.6 fL (ref 7.5–12.5)
Monocytes Relative: 9.1 %
Neutro Abs: 3461 cells/uL (ref 1500–7800)
Neutrophils Relative %: 64.1 %
Platelets: 111 10*3/uL — ABNORMAL LOW (ref 140–400)
RBC: 4.01 10*6/uL (ref 3.80–5.10)
RDW: 14.3 % (ref 11.0–15.0)
Total Lymphocyte: 24.5 %
WBC: 5.4 10*3/uL (ref 3.8–10.8)

## 2020-01-07 LAB — COMPREHENSIVE METABOLIC PANEL
AG Ratio: 1.5 (calc) (ref 1.0–2.5)
ALT: 12 U/L (ref 6–29)
AST: 20 U/L (ref 10–35)
Albumin: 3.8 g/dL (ref 3.6–5.1)
Alkaline phosphatase (APISO): 103 U/L (ref 37–153)
BUN: 8 mg/dL (ref 7–25)
CO2: 31 mmol/L (ref 20–32)
Calcium: 8.7 mg/dL (ref 8.6–10.4)
Chloride: 103 mmol/L (ref 98–110)
Creat: 0.63 mg/dL (ref 0.50–1.05)
Globulin: 2.5 g/dL (calc) (ref 1.9–3.7)
Glucose, Bld: 164 mg/dL — ABNORMAL HIGH (ref 65–99)
Potassium: 4.2 mmol/L (ref 3.5–5.3)
Sodium: 142 mmol/L (ref 135–146)
Total Bilirubin: 0.8 mg/dL (ref 0.2–1.2)
Total Protein: 6.3 g/dL (ref 6.1–8.1)

## 2020-01-07 LAB — THYROID PANEL WITH TSH
Free Thyroxine Index: 4 — ABNORMAL HIGH (ref 1.4–3.8)
T3 Uptake: 28 % (ref 22–35)
T4, Total: 14.3 ug/dL — ABNORMAL HIGH (ref 5.1–11.9)
TSH: 2.74 mIU/L

## 2020-01-07 LAB — HEMOGLOBIN A1C
Hgb A1c MFr Bld: 6.3 % of total Hgb — ABNORMAL HIGH (ref <5.7)
Mean Plasma Glucose: 134 (calc)
eAG (mmol/L): 7.4 (calc)

## 2020-01-07 NOTE — Telephone Encounter (Signed)
Patient request call back to discuss lab results.

## 2020-01-07 NOTE — Telephone Encounter (Signed)
Labs are notable for the following:  Low ferritin and low hemoglobin indicate iron deficiency.  I would suggest taking over-the-counter ferrous sulfate or ferrous gluconate at about 325 mg daily.  Recheck in about 3 months.  Blood sugar and A1c are elevated again.  Dietary changes we discussed should help with this.  Recheck in 3 to 4 months.  Overall thyroid function looks good.

## 2020-01-08 ENCOUNTER — Other Ambulatory Visit: Payer: Self-pay | Admitting: Family Medicine

## 2020-01-08 DIAGNOSIS — D696 Thrombocytopenia, unspecified: Secondary | ICD-10-CM

## 2020-01-08 DIAGNOSIS — D649 Anemia, unspecified: Secondary | ICD-10-CM

## 2020-01-08 NOTE — Progress Notes (Signed)
Will also request hematology consult for anemia, low platelets.

## 2020-01-08 NOTE — Telephone Encounter (Signed)
She has reviewed her lab results on MyChart already, before she called in here. Would you have time to call her?

## 2020-01-14 ENCOUNTER — Telehealth: Payer: Self-pay | Admitting: Hematology and Oncology

## 2020-01-14 NOTE — Telephone Encounter (Signed)
Received a new hem referral from Dr. Junius Roads for dx of anemia and thrombocytopenia. Marissa [name] has been cld and scheduled to see Dr. Alvy Bimler on 5/3 at 11:15am. Pt aware to arrive 15 minutes early.

## 2020-01-15 ENCOUNTER — Telehealth: Payer: Self-pay | Admitting: *Deleted

## 2020-01-15 ENCOUNTER — Telehealth: Payer: Self-pay | Admitting: Hematology and Oncology

## 2020-01-15 ENCOUNTER — Inpatient Hospital Stay: Payer: Medicare HMO | Attending: Hematology and Oncology | Admitting: Hematology and Oncology

## 2020-01-15 ENCOUNTER — Other Ambulatory Visit: Payer: Self-pay

## 2020-01-15 ENCOUNTER — Encounter: Payer: Self-pay | Admitting: Hematology and Oncology

## 2020-01-15 DIAGNOSIS — D696 Thrombocytopenia, unspecified: Secondary | ICD-10-CM | POA: Insufficient documentation

## 2020-01-15 DIAGNOSIS — Z856 Personal history of leukemia: Secondary | ICD-10-CM | POA: Diagnosis not present

## 2020-01-15 DIAGNOSIS — D539 Nutritional anemia, unspecified: Secondary | ICD-10-CM | POA: Insufficient documentation

## 2020-01-15 DIAGNOSIS — D509 Iron deficiency anemia, unspecified: Secondary | ICD-10-CM | POA: Insufficient documentation

## 2020-01-15 NOTE — Progress Notes (Signed)
[employer] NOTE  Patient Care Team: Eunice Blase, MD as PCP - General (Family Medicine) Ronald Lobo, MD as Consulting Physician (Gastroenterology) Huel Cote, NP as Nurse Practitioner (Obstetrics and Gynecology) Luberta Mutter, MD as Consulting Physician (Ophthalmology)  CHIEF COMPLAINTS/PURPOSE OF CONSULTATION:  Iron deficiency anemia and history of thrombocytopenia, prior history of a LL  HISTORY OF PRESENTING ILLNESS:  Marissa [name] 54 y.o. female is here because of iron deficiency anemia and thrombocytopenia. 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 Cristina Gong approximately 3 years ago  She was found to have abnormal CBC from routine blood test by her primary care doctor On review of her blood work, in August 2020, her platelet count was low at 90,000 A month later in September, when it was repeated, her platelet count improved to 137,000 By December 2020, her platelet count is normal Most recently, on January 06, 2020, her hemoglobin was 10.1 and platelet count of 111,000.  She was also found to have iron deficiency On review of her medication list, the patient is noted to have been taking NSAID She used to take a lot of NSAID for chronic knee pain, back pain and shoulder pain. She was prescribed pantoprazole for acid reflux that was diagnosed when a trigger her asthma attack She has been on pantoprazole for approximately 2 years She was also noted to have received antibiotics recently for urinary tract infection  She denies recent bruising/bleeding, such as spontaneous epistaxis, hematuria, melena or hematochezia She denies recent excessive menorrhagia; in fact, she had hysterectomy in 2003 for  endometriosis The patient denies history of liver disease, exposure to heparin, history of cardiac murmur/prior cardiovascular surgery or recent new medications She complained of poor energy  She was prescribed oral iron supplements of which she takes daily with food  MEDICAL HISTORY:  Past Medical History:  Diagnosis Date  . Abdominal pain    INTERMITANT  . Anemia   . Asthma   . GERD (gastroesophageal reflux disease)   . H/O hiatal hernia   . Hemorrhoids   . History of transfusion   . IBS (irritable bowel syndrome)   . Leukemia ([place])    as a child, acute lymphatic with T cell  . Plantar fasciitis   . Plantar fasciitis of left foot FALL 2012   HAD SURGERY TO FIX IT  . Prediabetes   . Rectal pain   . 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 Thyroid nodule     SURGICAL HISTORY: Past Surgical History:  Procedure Laterality Date  . ABDOMINAL HYSTERECTOMY  2003   BSO  . BIOPSY THYROID  11/2009   benign  . carpel tunnel  2004   rt hand  . COLONOSCOPY WITH PROPOFOL N/A 11/08/2016   Procedure: COLONOSCOPY WITH PROPOFOL;  Surgeon: Ronald Lobo, MD;  Location: [facility];  Service: Endoscopy;  Laterality: N/A;  . eye biospy  1979   dx with leukemia from this biospy  . eye biospy  1979   rt eye, dx: leukemia  . EYE SURGERY  1982   rt eye  . NASAL SINUS SURGERY  1999  . PLANTAR FASCIA SURGERY  2004, 2012   rt foot, left foot  . RADICAL HYSTERECTOMY    .  skin cancer removal on shoulder  2/14  . THYROID LOBECTOMY Left 09/25/2013   Procedure: LEFT THYROID LOBECTOMY;  Surgeon: Earnstine Regal, MD;  Location: [facility];  Service: General;  Laterality: Left;    SOCIAL HISTORY: Social History   Socioeconomic History  . Marital status: Single    Spouse name: Not on file  . Number of children: Not on file  . Years of education: Not on file  . Highest education level: Not on file  Occupational History  . Not on file  Tobacco Use  . Smoking status: Former Smoker    Quit  date: 09/15/1991    Years since quitting: 28.3  . Smokeless tobacco: Never Used  Substance and Sexual Activity  . Alcohol use: Yes    Alcohol/week: 0.0 standard drinks    Comment: rarely  . Drug use: No  . Sexual activity: Never    Partners: Male    Birth control/protection: Surgical    Comment: hyst.....Marland KitchenINTERCOURSE AGE 9, SEXUAL PARTNERS LESS THAN 5  Other Topics Concern  . Not on file  Social History Narrative  . Not on file   Social Determinants of Health   Financial Resource Strain:   . Difficulty of Paying Living Expenses:   Food Insecurity:   . Worried About Charity fundraiser in the Last Year:   . Arboriculturist in the Last Year:   Transportation Needs:   . Film/video editor (Medical):   Marland Kitchen Lack of Transportation (Non-Medical):   Physical Activity:   . Days of Exercise per Week:   . Minutes of Exercise per Session:   Stress:   . Feeling of Stress :   Social Connections:   . Frequency of Communication with Friends and Family:   . Frequency of Social Gatherings with Friends and Family:   . Attends Religious Services:   . Active Member of Clubs or Organizations:   . Attends Archivist Meetings:   Marland Kitchen Marital Status:   Intimate Partner Violence:   . Fear of Current or Ex-Partner:   . Emotionally Abused:   Marland Kitchen Physically Abused:   . Sexually Abused:     FAMILY HISTORY: Family History  Problem Relation Age of Onset  . Cancer Mother 79       LUNG  . Diabetes Mother   . Diabetes Father   . Stroke Father   . Heart disease Father   . Vision loss Father        GLAUCOMA  . Cancer Maternal Aunt        breast  . Breast cancer Maternal Aunt   . Diverticulosis Other   . Colon cancer Neg Hx     ALLERGIES:  is allergic to breo ellipta [fluticasone furoate-vilanterol]; erythromycin; penicillins; quinine derivatives; and sulfa antibiotics.  MEDICATIONS:  Current Outpatient Medications  Medication Sig Dispense Refill  . aspirin 81 MG tablet Take 81  mg by mouth at bedtime.     . calcium carbonate (TUMS - DOSED IN MG ELEMENTAL CALCIUM) 500 MG chewable tablet Chew 2 tablets by mouth daily as needed for indigestion or heartburn.    . Cholecalciferol (VITAMIN D3) 125 MCG (5000 UT) CAPS Take by mouth daily.    . Cranberry-Vitamin C-Vitamin E (CRANBERRY PLUS VITAMIN C PO) Take by mouth daily.    . cyclobenzaprine (FLEXERIL) 10 MG tablet Take 1 tablet (10 mg total) by mouth 3 (three) times daily as needed for muscle spasms. 90 tablet 3  . fexofenadine (ALLEGRA)  180 MG tablet Take 180 mg by mouth daily.     . fluticasone (FLONASE) 50 MCG/ACT nasal spray Place 1 spray into both nostrils daily.    . fluticasone (FLOVENT HFA) 110 MCG/ACT inhaler Inhale into the lungs 2 (two) times daily.    Marland Kitchen HYDROcodone-acetaminophen (NORCO/VICODIN) 5-325 MG tablet Take 1 tablet by mouth 2 (two) times daily as needed for moderate pain. 60 tablet 0  . ibuprofen (ADVIL,MOTRIN) 200 MG tablet Take 800-1,200 mg by mouth 2 (two) times daily as needed for mild pain or moderate pain.     Marland Kitchen levothyroxine (SYNTHROID) 75 MCG tablet Take 1 tablet (75 mcg total) by mouth daily before breakfast. 90 tablet 1  . Magnesium 250 MG TABS Take by mouth daily.    . Menatetrenone (VITAMIN K2) 100 MCG TABS Take by mouth daily.    . metFORMIN (GLUCOPHAGE) 500 MG tablet TAKE 1 TABLET TWICE DAILY WITH FOOD 60 tablet 1  . montelukast (SINGULAIR) 10 MG tablet Take 10 mg by mouth at bedtime.   5  . nystatin (MYCOSTATIN/NYSTOP) powder Apply topically 3 (three) times daily. 15 g 4  . nystatin-triamcinolone ointment (MYCOLOG) Apply 1 application topically 2 (two) times daily. 60 g 6  . pantoprazole (PROTONIX) 40 MG tablet Take 1 tablet (40 mg total) by mouth daily. 90 tablet 3  . pregabalin (LYRICA) 75 MG capsule 2 PO BID 120 capsule 3  . PROAIR HFA 108 (90 BASE) MCG/ACT inhaler Inhale 1-2 puffs into the lungs every 6 (six) hours as needed for wheezing or shortness of breath.   0  . Probiotic Product  (PROBIOTIC DAILY PO) Take by mouth daily.     No current facility-administered medications for this visit.    REVIEW OF SYSTEMS:   Constitutional: Denies fevers, chills or abnormal night sweats Eyes: Denies blurriness of vision, double vision or watery eyes Ears, nose, mouth, throat, and face: Denies mucositis or sore throat Respiratory: Denies cough, dyspnea or wheezes Cardiovascular: Denies palpitation, chest discomfort or lower extremity swelling Gastrointestinal:  Denies nausea, heartburn or change in bowel habits Skin: Denies abnormal skin rashes Lymphatics: Denies new lymphadenopathy or easy bruising Neurological:Denies numbness, tingling or new weaknesses Behavioral/Psych: Mood is stable, no new changes  All other systems were reviewed with the patient and are negative.  PHYSICAL EXAMINATION: ECOG PERFORMANCE STATUS: 1 - Symptomatic but completely ambulatory  Vitals:   01/15/20 1110  BP: (!) 150/62  Pulse: 91  Resp: 18  Temp: 98.5 F (36.9 C)  SpO2: 97%   Filed Weights   01/15/20 1110  Weight: (!) 337 lb 12.8 oz (153.2 kg)    GENERAL:alert, no distress and comfortable NEURO: no focal motor/sensory deficits  LABORATORY DATA:  I have reviewed the data as listed Lab Results  Component Value Date   WBC 5.4 01/06/2020   HGB 10.1 (L) 01/06/2020   HCT 33.0 (L) 01/06/2020   MCV 82.3 01/06/2020   PLT 111 (L) 01/06/2020    ASSESSMENT & PLAN Iron deficiency anemia The most likely cause of her iron deficiency anemia is related to probable gastritis/peptic ulcer disease given her symptoms and her use of NSAID She appears to tolerate oral iron supplement well Recommend she continue the same for another 2 months I plan to bring her back early July for repeat blood work and if she makes no progress on oral iron supplement, I will be prepared to give her intravenous iron infusion In the meantime, I think it is very important for her to be  referred back to her GI doctor  for EGD and repeat colonoscopy She agreed with the plan of care  History of acute lymphoblastic leukemia (ALL) Based on her recent blood work, she have no signs of recurrence I do not believe her thrombocytopenia is due to this  Thrombocytopenia ([place]) She has mild intermittent thrombocytopenia, likely related to recent infection and consumption She is not symptomatic Observe for now I plan to check vitamin B12 level in her next visit    Orders Placed This Encounter  Procedures  . Iron and TIBC    Standing Status:   Future    Standing Expiration Date:   02/18/2021  . Ferritin    Standing Status:   Future    Standing Expiration Date:   02/18/2021  . Vitamin B12    Standing Status:   Future    Standing Expiration Date:   02/18/2021  . Sedimentation rate    Standing Status:   Future    Standing Expiration Date:   02/18/2021  . CBC with Differential/Platelet    Standing Status:   Future    Standing Expiration Date:   02/18/2021  . Ambulatory referral to Gastroenterology    Referral Priority:   Routine    Referral Type:   Consultation    Referral Reason:   Specialty Services Required    Referred to Provider:   Ronald Lobo, MD    Number of Visits Requested:   1   All questions were answered. The patient knows to call the clinic with any problems, questions or concerns. No barriers to learning was detected. The total time spent in the appointment was 45 minutes encounter with patients including review of chart and various tests results, discussions about plan of care and coordination of care plan  Heath Lark, MD 01/15/2020 2:44 PM

## 2020-01-15 NOTE — Assessment & Plan Note (Signed)
Based on her recent blood work, she have no signs of recurrence I do not believe her thrombocytopenia is due to this  

## 2020-01-15 NOTE — Assessment & Plan Note (Signed)
The most likely cause of her iron deficiency anemia is related to probable gastritis/peptic ulcer disease given her symptoms and her use of NSAID She appears to tolerate oral iron supplement well Recommend she continue the same for another 2 months I plan to bring her back early July for repeat blood work and if she makes no progress on oral iron supplement, I will be prepared to give her intravenous iron infusion In the meantime, I think it is very important for her to be referred back to her GI doctor for EGD and repeat colonoscopy She agreed with the plan of care

## 2020-01-15 NOTE — Telephone Encounter (Signed)
Per Dr.Gorsuch, called Dr.Buccini office at [facility] for referral. Office notes and referral was faxed to [phone]. Pt called with new appt time and date 5/12 at 2:30pm. Pt verbalized understanding

## 2020-01-15 NOTE — Assessment & Plan Note (Signed)
She has mild intermittent thrombocytopenia, likely related to recent infection and consumption She is not symptomatic Observe for now I plan to check vitamin B12 level in her next visit

## 2020-01-15 NOTE — Telephone Encounter (Signed)
Scheduled appt per 4/23 sch message - pt aware of appt date and times

## 2020-01-16 ENCOUNTER — Other Ambulatory Visit: Payer: Self-pay | Admitting: Family Medicine

## 2020-02-03 ENCOUNTER — Other Ambulatory Visit: Payer: Self-pay | Admitting: Gastroenterology

## 2020-02-19 ENCOUNTER — Telehealth: Payer: Self-pay | Admitting: Family Medicine

## 2020-02-19 MED ORDER — EPINEPHRINE 0.3 MG/0.3ML IJ SOAJ: 0.3000 mg | INTRAMUSCULAR | 11 refills | Status: DC | PRN

## 2020-02-19 NOTE — Telephone Encounter (Signed)
I called the patient and advised her of the epipen Rx. She will check the internet for living will forms. She said the nurse for her insurance has done her annual check on the patient and suggested the epipen and the living will. If she has any more questions on it for Dr. Junius Roads, she will send them through MyChart to him.

## 2020-02-19 NOTE — Telephone Encounter (Signed)
Epipen Rx sent.  Can she send a mychart message regarding living will?  Generally there are forms on the internet for this.

## 2020-02-19 NOTE — Telephone Encounter (Signed)
Patient called.   She is requesting a call back to discuss a living will as well as getting an epi-pen   Call back: [phone]

## 2020-02-19 NOTE — Addendum Note (Signed)
Addended by: Hortencia Pilar on: 02/19/2020 12:13 PM   Modules accepted: Orders

## 2020-03-01 ENCOUNTER — Ambulatory Visit (HOSPITAL_COMMUNITY): Admit: 2020-03-01 | Payer: Medicare HMO | Admitting: Gastroenterology

## 2020-03-01 ENCOUNTER — Encounter (HOSPITAL_COMMUNITY): Payer: Self-pay

## 2020-03-01 SURGERY — ESOPHAGOGASTRODUODENOSCOPY (EGD) WITH PROPOFOL
Anesthesia: Monitor Anesthesia Care

## 2020-03-03 ENCOUNTER — Other Ambulatory Visit: Payer: Self-pay | Admitting: Gastroenterology

## 2020-03-10 ENCOUNTER — Other Ambulatory Visit: Payer: Self-pay | Admitting: Family Medicine

## 2020-03-12 ENCOUNTER — Inpatient Hospital Stay (HOSPITAL_COMMUNITY): Admission: RE | Admit: 2020-03-12 | Payer: Medicare HMO | Source: Ambulatory Visit

## 2020-03-25 ENCOUNTER — Other Ambulatory Visit: Payer: Self-pay

## 2020-03-25 ENCOUNTER — Inpatient Hospital Stay: Payer: Medicare HMO | Attending: Hematology and Oncology

## 2020-03-25 DIAGNOSIS — C9101 Acute lymphoblastic leukemia, in remission: Secondary | ICD-10-CM | POA: Diagnosis present

## 2020-03-25 DIAGNOSIS — E538 Deficiency of other specified B group vitamins: Secondary | ICD-10-CM | POA: Insufficient documentation

## 2020-03-25 DIAGNOSIS — D539 Nutritional anemia, unspecified: Secondary | ICD-10-CM

## 2020-03-25 DIAGNOSIS — D696 Thrombocytopenia, unspecified: Secondary | ICD-10-CM | POA: Insufficient documentation

## 2020-03-25 DIAGNOSIS — D509 Iron deficiency anemia, unspecified: Secondary | ICD-10-CM | POA: Insufficient documentation

## 2020-03-25 LAB — CBC WITH DIFFERENTIAL/PLATELET
Abs Immature Granulocytes: 0.01 10*3/uL (ref 0.00–0.07)
Basophils Absolute: 0 10*3/uL (ref 0.0–0.1)
Basophils Relative: 0 %
Eosinophils Absolute: 0.1 10*3/uL (ref 0.0–0.5)
Eosinophils Relative: 2 %
HCT: 39.9 % (ref 36.0–46.0)
Hemoglobin: 13 g/dL (ref 12.0–15.0)
Immature Granulocytes: 0 %
Lymphocytes Relative: 30 %
Lymphs Abs: 1.7 10*3/uL (ref 0.7–4.0)
MCH: 30 pg (ref 26.0–34.0)
MCHC: 32.6 g/dL (ref 30.0–36.0)
MCV: 92.1 fL (ref 80.0–100.0)
Monocytes Absolute: 0.4 10*3/uL (ref 0.1–1.0)
Monocytes Relative: 8 %
Neutro Abs: 3.3 10*3/uL (ref 1.7–7.7)
Neutrophils Relative %: 60 %
Platelets: 115 10*3/uL — ABNORMAL LOW (ref 150–400)
RBC: 4.33 MIL/uL (ref 3.87–5.11)
RDW: 15.7 % — ABNORMAL HIGH (ref 11.5–15.5)
WBC: 5.5 10*3/uL (ref 4.0–10.5)
nRBC: 0 % (ref 0.0–0.2)

## 2020-03-25 LAB — IRON AND TIBC
Iron: 101 ug/dL (ref 41–142)
Saturation Ratios: 32 % (ref 21–57)
TIBC: 314 ug/dL (ref 236–444)
UIBC: 213 ug/dL (ref 120–384)

## 2020-03-25 LAB — FERRITIN: Ferritin: 26 ng/mL (ref 11–307)

## 2020-03-25 LAB — VITAMIN B12: Vitamin B-12: 177 pg/mL — ABNORMAL LOW (ref 180–914)

## 2020-03-25 LAB — SEDIMENTATION RATE: Sed Rate: 30 mm/hr — ABNORMAL HIGH (ref 0–22)

## 2020-03-29 ENCOUNTER — Other Ambulatory Visit: Payer: Self-pay | Admitting: Hematology and Oncology

## 2020-04-01 ENCOUNTER — Inpatient Hospital Stay: Payer: Medicare HMO

## 2020-04-01 ENCOUNTER — Inpatient Hospital Stay: Payer: Medicare HMO | Admitting: Hematology and Oncology

## 2020-04-01 ENCOUNTER — Other Ambulatory Visit: Payer: Self-pay

## 2020-04-01 ENCOUNTER — Encounter: Payer: Self-pay | Admitting: Hematology and Oncology

## 2020-04-01 VITALS — BP 122/71 | HR 85 | Temp 98.9°F | Resp 18

## 2020-04-01 VITALS — BP 159/69 | HR 103 | Temp 98.7°F | Resp 18 | Ht 63.0 in | Wt 336.4 lb

## 2020-04-01 DIAGNOSIS — C9101 Acute lymphoblastic leukemia, in remission: Secondary | ICD-10-CM | POA: Diagnosis not present

## 2020-04-01 DIAGNOSIS — D539 Nutritional anemia, unspecified: Secondary | ICD-10-CM | POA: Diagnosis not present

## 2020-04-01 DIAGNOSIS — E538 Deficiency of other specified B group vitamins: Secondary | ICD-10-CM

## 2020-04-01 DIAGNOSIS — D696 Thrombocytopenia, unspecified: Secondary | ICD-10-CM

## 2020-04-01 DIAGNOSIS — D509 Iron deficiency anemia, unspecified: Secondary | ICD-10-CM

## 2020-04-01 DIAGNOSIS — Z856 Personal history of leukemia: Secondary | ICD-10-CM | POA: Diagnosis not present

## 2020-04-01 MED ORDER — CYANOCOBALAMIN 1000 MCG/ML IJ SOLN
1000.0000 ug | Freq: Once | INTRAMUSCULAR | Status: AC
  Administered 2020-04-01: 1000 ug via INTRAMUSCULAR

## 2020-04-01 MED ORDER — SODIUM CHLORIDE 0.9 % IV SOLN
510.0000 mg | Freq: Once | INTRAVENOUS | Status: AC
  Administered 2020-04-01: 510 mg via INTRAVENOUS
  Filled 2020-04-01: qty 510

## 2020-04-01 MED ORDER — SODIUM CHLORIDE 0.9 % IV SOLN
Freq: Once | INTRAVENOUS | Status: AC
  Filled 2020-04-01: qty 250

## 2020-04-01 MED ORDER — CYANOCOBALAMIN 1000 MCG/ML IJ SOLN
INTRAMUSCULAR | Status: AC
  Filled 2020-04-01: qty 1

## 2020-04-01 NOTE — Assessment & Plan Note (Signed)
She has concurrent vitamin B12 deficiency We discussed the risk and benefits of vitamin B12 injections and she is in agreement to proceed She will proceed with vitamin B12 injection today and then once a month I will see her back in 6 months for further follow-up

## 2020-04-01 NOTE — Patient Instructions (Signed)
Ferumoxytol injection What is this medicine? FERUMOXYTOL is an iron complex. Iron is used to make healthy red blood cells, which carry oxygen and nutrients throughout the body. This medicine is used to treat iron deficiency anemia. This medicine may be used for other purposes; ask your health care provider or pharmacist if you have questions. COMMON BRAND NAME(S): Feraheme What should I tell my health care provider before I take this medicine? They need to know if you have any of these conditions:  anemia not caused by low iron levels  high levels of iron in the blood  magnetic resonance imaging (MRI) test scheduled  an unusual or allergic reaction to iron, other medicines, foods, dyes, or preservatives  pregnant or trying to get pregnant  breast-feeding How should I use this medicine? This medicine is for injection into a vein. It is given by a health care professional in a hospital or clinic setting. Talk to your pediatrician regarding the use of this medicine in children. Special care may be needed. Overdosage: If you think you have taken too much of this medicine contact a poison control center or emergency room at once. NOTE: This medicine is only for you. Do not share this medicine with others. What if I miss a dose? It is important not to miss your dose. Call your doctor or health care professional if you are unable to keep an appointment. What may interact with this medicine? This medicine may interact with the following medications:  other iron products This list may not describe all possible interactions. Give your health care provider a list of all the medicines, herbs, non-prescription drugs, or dietary supplements you use. Also tell them if you smoke, drink alcohol, or use illegal drugs. Some items may interact with your medicine. What should I watch for while using this medicine? Visit your doctor or healthcare professional regularly. Tell your doctor or healthcare  professional if your symptoms do not start to get better or if they get worse. You may need blood work done while you are taking this medicine. You may need to follow a special diet. Talk to your doctor. Foods that contain iron include: whole grains/cereals, dried fruits, beans, or peas, leafy green vegetables, and organ meats (liver, kidney). What side effects may I notice from receiving this medicine? Side effects that you should report to your doctor or health care professional as soon as possible:  allergic reactions like skin rash, itching or hives, swelling of the face, lips, or tongue  breathing problems  changes in blood pressure  feeling faint or lightheaded, falls  fever or chills  flushing, sweating, or hot feelings  swelling of the ankles or feet Side effects that usually do not require medical attention (report to your doctor or health care professional if they continue or are bothersome):  diarrhea  headache  nausea, vomiting  stomach pain This list may not describe all possible side effects. Call your doctor for medical advice about side effects. You may report side effects to FDA at 1-800-FDA-1088. Where should I keep my medicine? This drug is given in a hospital or clinic and will not be stored at home. NOTE: This sheet is a summary. It may not cover all possible information. If you have questions about this medicine, talk to your doctor, pharmacist, or health care provider.  2020 Elsevier/Gold Standard (2016-10-29 20:21:10)  Cyanocobalamin, Vitamin B12 injection What is this medicine? CYANOCOBALAMIN (sye an oh koe BAL a min) is a man made form of vitamin   B12. Vitamin B12 is used in the growth of healthy blood cells, nerve cells, and proteins in the body. It also helps with the metabolism of fats and carbohydrates. This medicine is used to treat people who can not absorb vitamin B12. This medicine may be used for other purposes; ask your health care provider or  pharmacist if you have questions. COMMON BRAND NAME(S): B-12 Compliance Kit, B-12 Injection Kit, Cyomin, LA-12, Nutri-Twelve, Physicians EZ Use B-12, Primabalt What should I tell my health care provider before I take this medicine? They need to know if you have any of these conditions:  kidney disease  Leber's disease  megaloblastic anemia  an unusual or allergic reaction to cyanocobalamin, cobalt, other medicines, foods, dyes, or preservatives  pregnant or trying to get pregnant  breast-feeding How should I use this medicine? This medicine is injected into a muscle or deeply under the skin. It is usually given by a health care professional in a clinic or doctor's office. However, your doctor may teach you how to inject yourself. Follow all instructions. Talk to your pediatrician regarding the use of this medicine in children. Special care may be needed. Overdosage: If you think you have taken too much of this medicine contact a poison control center or emergency room at once. NOTE: This medicine is only for you. Do not share this medicine with others. What if I miss a dose? If you are given your dose at a clinic or doctor's office, call to reschedule your appointment. If you give your own injections and you miss a dose, take it as soon as you can. If it is almost time for your next dose, take only that dose. Do not take double or extra doses. What may interact with this medicine?  colchicine  heavy alcohol intake This list may not describe all possible interactions. Give your health care provider a list of all the medicines, herbs, non-prescription drugs, or dietary supplements you use. Also tell them if you smoke, drink alcohol, or use illegal drugs. Some items may interact with your medicine. What should I watch for while using this medicine? Visit your doctor or health care professional regularly. You may need blood work done while you are taking this medicine. You may need to  follow a special diet. Talk to your doctor. Limit your alcohol intake and avoid smoking to get the best benefit. What side effects may I notice from receiving this medicine? Side effects that you should report to your doctor or health care professional as soon as possible:  allergic reactions like skin rash, itching or hives, swelling of the face, lips, or tongue  blue tint to skin  chest tightness, pain  difficulty breathing, wheezing  dizziness  red, swollen painful area on the leg Side effects that usually do not require medical attention (report to your doctor or health care professional if they continue or are bothersome):  diarrhea  headache This list may not describe all possible side effects. Call your doctor for medical advice about side effects. You may report side effects to FDA at 1-800-FDA-1088. Where should I keep my medicine? Keep out of the reach of children. Store at room temperature between 15 and 30 degrees C (59 and 85 degrees F). Protect from light. Throw away any unused medicine after the expiration date. NOTE: This sheet is a summary. It may not cover all possible information. If you have questions about this medicine, talk to your doctor, pharmacist, or health care provider.  2020 Elsevier/Gold   Standard (2007-12-22 22:10:20)   

## 2020-04-01 NOTE — Assessment & Plan Note (Signed)
Based on her recent blood work, she have no signs of recurrence I do not believe her thrombocytopenia is due to this  

## 2020-04-01 NOTE — Progress Notes (Signed)
[place] OFFICE PROGRESS NOTE  Patient Care Team: [place], Legrand Como, MD as PCP - General (Family Medicine) Ronald Lobo, MD as Consulting Physician (Gastroenterology) Huel Cote, NP as Nurse Practitioner (Obstetrics and Gynecology) Luberta Mutter, MD as Consulting Physician (Ophthalmology)  ASSESSMENT & PLAN:  History of acute lymphoblastic leukemia (ALL) Based on her recent blood work, she have no signs of recurrence I do not believe her thrombocytopenia is due to this  Iron deficiency anemia The most likely cause of her iron deficiency anemia is related to probable gastritis/peptic ulcer disease given her symptoms and her use of NSAID She has no clinical benefit taking oral iron supplement  The most likely cause of her anemia is due to chronic blood loss/malabsorption syndrome. We discussed some of the risks, benefits, and alternatives of intravenous iron infusions. The patient is symptomatic from anemia and the iron level is critically low. She tolerated oral iron supplement poorly and desires to achieved higher levels of iron faster for adequate hematopoesis. Some of the side-effects to be expected including risks of infusion reactions, phlebitis, headaches, nausea and fatigue.  The patient is willing to proceed. Patient education material was dispensed.  Goal is to keep ferritin level greater than 50 and resolution of anemia She will proceed with 1 dose of intravenous iron today I plan to recheck iron studies again in 6 months She is scheduled for EGD and colonoscopy evaluation    Vitamin B12 deficiency She has concurrent vitamin B12 deficiency We discussed the risk and benefits of vitamin B12 injections and she is in agreement to proceed She will proceed with vitamin B12 injection today and then once a month I will see her back in 6 months for further follow-up  Thrombocytopenia ([place]) She has mild intermittent thrombocytopenia, likely related to recent  infection and consumption She is not symptomatic Observe for now I am hopeful that vitamin B12 deficiency, once it is corrected, may help resolve the thrombocytopenia issue    Orders Placed This Encounter  Procedures  . CBC with Differential/Platelet    Standing Status:   Standing    Number of Occurrences:   22    Standing Expiration Date:   04/01/2021  . Vitamin B12    Standing Status:   Standing    Number of Occurrences:   5    Standing Expiration Date:   04/01/2021  . Iron and TIBC    Standing Status:   Standing    Number of Occurrences:   5    Standing Expiration Date:   04/01/2021  . Ferritin    Standing Status:   Standing    Number of Occurrences:   5    Standing Expiration Date:   04/01/2021    All questions were answered. The patient knows to call the clinic with any problems, questions or concerns. The total time spent in the appointment was 20 minutes encounter with patients including review of chart and various tests results, discussions about plan of care and coordination of care plan   Heath Lark, MD 04/01/2020 11:33 AM  INTERVAL HISTORY: Please see below for problem oriented charting. She returns for further follow-up She complains of chronic joint pain Denies recent bleeding She denies taking NSAID recently  SUMMARY OF ONCOLOGIC HISTORY: Marissa [name] 54 y.o. female is here because of iron deficiency anemia and thrombocytopenia. 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 Cristina Gong approximately 3 years ago  She was found to have abnormal CBC from routine blood test by her primary care doctor On review of her blood work, in August 2020, her platelet count was low at 90,000 A month later in September, when it was  repeated, her platelet count improved to 137,000 By December 2020, her platelet count is normal Most recently, on January 06, 2020, her hemoglobin was 10.1 and platelet count of 111,000.  She was also found to have iron deficiency On review of her medication list, the patient is noted to have been taking NSAID She used to take a lot of NSAID for chronic knee pain, back pain and shoulder pain. She was prescribed pantoprazole for acid reflux that was diagnosed when a trigger her asthma attack She has been on pantoprazole for approximately 2 years She was also noted to have received antibiotics recently for urinary tract infection  She denies recent bruising/bleeding, such as spontaneous epistaxis, hematuria, melena or hematochezia She denies recent excessive menorrhagia; in fact, she had hysterectomy in 2003 for endometriosis The patient denies history of liver disease, exposure to heparin, history of cardiac murmur/prior cardiovascular surgery or recent new medications She complained of poor energy  She was prescribed oral iron supplements of which she takes daily with food but she is still having issues with chronic iron deficiency anemia On April 01, 2020, she received 1 dose of intravenous iron and B12 supplementation  REVIEW OF SYSTEMS:   Constitutional: Denies fevers, chills or abnormal weight loss Eyes: Denies blurriness of vision Ears, nose, mouth, throat, and face: Denies mucositis or sore throat Respiratory: Denies cough, dyspnea or wheezes Cardiovascular: Denies palpitation, chest discomfort or lower extremity swelling Gastrointestinal:  Denies nausea, heartburn or change in bowel habits Skin: Denies abnormal skin rashes Lymphatics: Denies new lymphadenopathy or easy bruising Neurological:Denies numbness, tingling or new weaknesses Behavioral/Psych: Mood is stable, no new changes  All other systems were reviewed with the patient and are negative.  I have reviewed the past medical  history, past surgical history, social history and family history with the patient and they are unchanged from previous note.  ALLERGIES:  is allergic to breo ellipta [fluticasone furoate-vilanterol], erythromycin, penicillins, quinine derivatives, and sulfa antibiotics.  MEDICATIONS:  Current Outpatient Medications  Medication Sig Dispense Refill  . aspirin 81 MG tablet Take 81 mg by mouth at bedtime.     . Cholecalciferol (VITAMIN D3) 125 MCG (5000 UT) CAPS Take 5,000 Units by mouth daily.     . Cranberry-Vitamin C-Vitamin E (CRANBERRY PLUS VITAMIN C PO) Take 1 tablet by mouth daily.     . cyclobenzaprine (FLEXERIL) 10 MG tablet Take 1 tablet (10 mg total) by mouth 3 (three) times daily as needed for muscle spasms. (Patient not taking: Reported on 03/31/2020) 90 tablet 3  . EPINEPHrine (EPIPEN 2-PAK) 0.3 mg/0.3 mL IJ SOAJ injection Inject 0.3 mLs (0.3 mg total) into the muscle as needed for anaphylaxis. 1 each 11  . fexofenadine (ALLEGRA) 180 MG tablet Take 180 mg by mouth daily.     . fluticasone (FLONASE) 50 MCG/ACT nasal spray Place 1 spray into both nostrils daily.    . fluticasone (FLOVENT HFA) 110 MCG/ACT inhaler Inhale 1 puff into the lungs 2 (two) times daily.     Marland Kitchen HYDROcodone-acetaminophen (NORCO/VICODIN) 5-325 MG tablet Take 1 tablet by mouth 2 (two)  times daily as needed for moderate pain. 60 tablet 0  . levothyroxine (SYNTHROID) 75 MCG tablet TAKE 1 TABLET EVERY DAY BEFORE BREAKFAST (Patient taking differently: Take 75 mcg by mouth daily before breakfast. ) 90 tablet 1  . Magnesium 250 MG TABS Take 250 mg by mouth daily.     . Menatetrenone (VITAMIN K2) 100 MCG TABS Take 100 mcg by mouth daily.     . metFORMIN (GLUCOPHAGE) 500 MG tablet TAKE 1 TABLET TWICE DAILY WITH FOOD (Patient taking differently: Take 500 mg by mouth 2 (two) times daily with a meal. ) 60 tablet 1  . montelukast (SINGULAIR) 10 MG tablet Take 10 mg by mouth at bedtime.   5  . nystatin (MYCOSTATIN/NYSTOP) powder  Apply topically 3 (three) times daily. (Patient taking differently: Apply 1 application topically 2 (two) times daily. ) 15 g 4  . nystatin-triamcinolone ointment (MYCOLOG) Apply 1 application topically 2 (two) times daily. 60 g 6  . pantoprazole (PROTONIX) 40 MG tablet Take 1 tablet (40 mg total) by mouth daily. 90 tablet 3  . pregabalin (LYRICA) 75 MG capsule 2 PO BID (Patient taking differently: Take 75 mg by mouth See admin instructions. Take 75 mg by mouth in the morning and 150 mg at night) 120 capsule 3  . PROAIR HFA 108 (90 BASE) MCG/ACT inhaler Inhale 1-2 puffs into the lungs every 6 (six) hours as needed for wheezing or shortness of breath.   0  . Probiotic Product (PROBIOTIC DAILY PO) Take 1 capsule by mouth daily.      No current facility-administered medications for this visit.    PHYSICAL EXAMINATION: ECOG PERFORMANCE STATUS: 1 - Symptomatic but completely ambulatory  Vitals:   04/01/20 1100  BP: (!) 159/69  Pulse: (!) 103  Resp: 18  Temp: 98.7 F (37.1 C)  SpO2: 96%   Filed Weights   04/01/20 1100  Weight: (!) 336 lb 6.4 oz (152.6 kg)    GENERAL:alert, no distress and comfortable NEURO: alert & oriented x 3 with fluent speech, no focal motor/sensory deficits  LABORATORY DATA:  I have reviewed the data as listed    Component Value Date/Time   NA 142 01/06/2020 0946   K 4.2 01/06/2020 0946   CL 103 01/06/2020 0946   CO2 31 01/06/2020 0946   GLUCOSE 164 (H) 01/06/2020 0946   BUN 8 01/06/2020 0946   CREATININE 0.63 01/06/2020 0946   CALCIUM 8.7 01/06/2020 0946   PROT 6.3 01/06/2020 0946   AST 20 01/06/2020 0946   ALT 12 01/06/2020 0946   BILITOT 0.8 01/06/2020 0946   GFRNONAA >90 09/14/2013 0950   GFRAA >90 09/14/2013 0950    No results found for: SPEP, UPEP  Lab Results  Component Value Date   WBC 5.5 03/25/2020   NEUTROABS 3.3 03/25/2020   HGB 13.0 03/25/2020   HCT 39.9 03/25/2020   MCV 92.1 03/25/2020   PLT 115 (L) 03/25/2020      Chemistry       Component Value Date/Time   NA 142 01/06/2020 0946   K 4.2 01/06/2020 0946   CL 103 01/06/2020 0946   CO2 31 01/06/2020 0946   BUN 8 01/06/2020 0946   CREATININE 0.63 01/06/2020 0946      Component Value Date/Time   CALCIUM 8.7 01/06/2020 0946   AST 20 01/06/2020 0946   ALT 12 01/06/2020 0946   BILITOT 0.8 01/06/2020 0946

## 2020-04-01 NOTE — Assessment & Plan Note (Signed)
The most likely cause of her iron deficiency anemia is related to probable gastritis/peptic ulcer disease given her symptoms and her use of NSAID She has no clinical benefit taking oral iron supplement  The most likely cause of her anemia is due to chronic blood loss/malabsorption syndrome. We discussed some of the risks, benefits, and alternatives of intravenous iron infusions. The patient is symptomatic from anemia and the iron level is critically low. She tolerated oral iron supplement poorly and desires to achieved higher levels of iron faster for adequate hematopoesis. Some of the side-effects to be expected including risks of infusion reactions, phlebitis, headaches, nausea and fatigue.  The patient is willing to proceed. Patient education material was dispensed.  Goal is to keep ferritin level greater than 50 and resolution of anemia She will proceed with 1 dose of intravenous iron today I plan to recheck iron studies again in 6 months She is scheduled for EGD and colonoscopy evaluation

## 2020-04-01 NOTE — Assessment & Plan Note (Signed)
She has mild intermittent thrombocytopenia, likely related to recent infection and consumption She is not symptomatic Observe for now I am hopeful that vitamin B12 deficiency, once it is corrected, may help resolve the thrombocytopenia issue

## 2020-04-06 ENCOUNTER — Encounter: Payer: Self-pay | Admitting: Family Medicine

## 2020-04-06 ENCOUNTER — Other Ambulatory Visit: Payer: Self-pay

## 2020-04-06 ENCOUNTER — Ambulatory Visit (INDEPENDENT_AMBULATORY_CARE_PROVIDER_SITE_OTHER): Payer: Medicare HMO | Admitting: Family Medicine

## 2020-04-06 VITALS — BP 122/72 | HR 95 | Ht 63.0 in | Wt 336.4 lb

## 2020-04-06 DIAGNOSIS — E042 Nontoxic multinodular goiter: Secondary | ICD-10-CM

## 2020-04-06 DIAGNOSIS — Z6841 Body Mass Index (BMI) 40.0 and over, adult: Secondary | ICD-10-CM

## 2020-04-06 DIAGNOSIS — E119 Type 2 diabetes mellitus without complications: Secondary | ICD-10-CM

## 2020-04-06 DIAGNOSIS — J4520 Mild intermittent asthma, uncomplicated: Secondary | ICD-10-CM

## 2020-04-06 MED ORDER — PREGABALIN 150 MG PO CAPS
150.0000 mg | ORAL_CAPSULE | Freq: Two times a day (BID) | ORAL | 6 refills | Status: DC
Start: 2020-04-06 — End: 2020-10-17

## 2020-04-06 MED ORDER — TRAMADOL HCL 50 MG PO TABS: 50.0000 mg | ORAL_TABLET | Freq: Two times a day (BID) | ORAL | 0 refills | Status: DC | PRN

## 2020-04-06 NOTE — Progress Notes (Signed)
Office Visit Note   Patient: Marissa [name]           Date of Birth: [birth year]           MRN: [id number] Visit Date: 04/06/2020 Requested by: Eunice Blase, MD [address] [place],  [place] [address] PCP: Eunice Blase, MD  Subjective: Chief Complaint  Patient presents with  . 3 months recheck    HPI: She is here for routine monitoring of medical conditions.  From a diabetes standpoint, she does not check her blood sugars every day but when she does, it is usually around 120 when fasting.  No numbness in the feet, no sores on her feet.  She has started weaning off soda.  She still drinks it occasionally, but has cut out quite a bit.  From a bladder standpoint, the stimulator does not seem to be working as well as she hoped it would.  She is trying to work on Kegel exercises and is trying to minimize caffeine intake.  She drinks a lot of water at night because her mouth gets dry.  She has a sister with sleep apnea.  She has never had a sleep study herself.  She does not want to pursue that right now.  From a hypothyroid standpoint, she takes 75 mcg daily.  She does note that she naps frequently during the day but she thinks it might be due to poor sleep at night.  She has pain in multiple joints including both hips, the right knee, and the left shoulder.  She takes Lyrica 75 mg during the day and 150 mg in the evening but sometimes it does not work well enough for her.               ROS:   All other systems were reviewed and are negative.  Objective: Vital Signs: BP 122/72   Pulse 95   Ht 5\' 3"  (1.6 m)   Wt (!) 336 lb 6.4 oz (152.6 kg)   LMP 07/23/2002   BMI 59.59 kg/m   Physical Exam:  General:  Alert and oriented, in no acute distress. Pulm:  Breathing unlabored. Psy:  Normal mood, congruent affect. Skin:  No lesions.  Neck: She has no thyroid nodules. CV: Regular rate and rhythm without murmurs, rubs, or gallops.  No peripheral edema.  2+ radial and posterior  tibial pulses. Lungs: Clear to auscultation throughout with no wheezing or areas of consolidation.    Imaging: No results found.  Assessment & Plan: 1.  Diabetes -We will check labs today.  She will continue to work on lifestyle changes.  2.  Super morbid obesity -Continue with lifestyle changes as above.  Recheck thyroid labs today and adjust dosage if needed.  3.  Chronic pain -Increase Lyrica to 150 mg twice daily.  Tramadol as needed for breakthrough.  4.  Hypothyroidism - Labs today.     Procedures: No procedures performed  No notes on file     PMFS History: Patient Active Problem List   Diagnosis Date Noted  . Vitamin B12 deficiency 04/01/2020  . Iron deficiency anemia 01/15/2020  . Deficiency anemia 01/15/2020  . Thrombocytopenia ([place]) 01/15/2020  . Diabetes ([place]) 09/07/2019  . Osteopenia of forearm 09/07/2019  . Gastroesophageal reflux disease 05/08/2019  . Hyperglycemia 05/08/2019  . Urinary incontinence 05/08/2019  . History of colon polyps 05/08/2019  . Mild intermittent asthma without complication 68/34/1962  . BV (bacterial vaginosis) 01/01/2013  . Anal fissure 07/07/2012  . History of  acute lymphoblastic leukemia (ALL) 01/21/2012  . Endometriosis 01/21/2012  . Class 3 severe obesity without serious comorbidity with body mass index (BMI) of 50.0 to 59.9 in adult ([place]) 11/28/2011  . Multinodular goiter (nontoxic), dominant nodule left lobe 07/18/2011   Past Medical History:  Diagnosis Date  . Abdominal pain    INTERMITANT  . Anemia   . Asthma   . GERD (gastroesophageal reflux disease)   . H/O hiatal hernia   . Hemorrhoids   . History of transfusion   . IBS (irritable bowel syndrome)   . Leukemia ([place])    as a child, acute lymphatic with T cell  . Plantar fasciitis   . Plantar fasciitis of left foot FALL 2012   HAD SURGERY TO FIX IT  . Prediabetes   . Rectal pain   . 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 Thyroid nodule     Family  History  Problem Relation Age of Onset  . Cancer Mother 13       LUNG  . Diabetes Mother   . Diabetes Father   . Stroke Father   . Heart disease Father   . Vision loss Father        GLAUCOMA  . Cancer Maternal Aunt        breast  . Breast cancer Maternal Aunt   . Diverticulosis Other   . Colon cancer Neg Hx     Past Surgical History:  Procedure Laterality Date  . ABDOMINAL HYSTERECTOMY  2003   BSO  . BIOPSY THYROID  11/2009   benign  . carpel tunnel  2004   rt hand  . COLONOSCOPY WITH PROPOFOL N/A 11/08/2016   Procedure: COLONOSCOPY WITH PROPOFOL;  Surgeon: Ronald Lobo, MD;  Location: [facility];  Service: Endoscopy;  Laterality: N/A;  . eye biospy  1979   dx with leukemia from this biospy  . eye biospy  1979   rt eye, dx: leukemia  . EYE SURGERY  1982   rt eye  . NASAL SINUS SURGERY  1999  . PLANTAR FASCIA SURGERY  2004, 2012   rt foot, left foot  . RADICAL HYSTERECTOMY    . skin cancer removal on shoulder  2/14  . THYROID LOBECTOMY Left 09/25/2013   Procedure: LEFT THYROID LOBECTOMY;  Surgeon: Earnstine Regal, MD;  Location: [facility] ORS;  Service: General;  Laterality: Left;   Social History   Occupational History  . Not on file  Tobacco Use  . Smoking status: Former Smoker    Quit date: 09/15/1991    Years since quitting: 28.5  . Smokeless tobacco: Never Used  Vaping Use  . Vaping Use: Never used  Substance and Sexual Activity  . Alcohol use: Yes    Alcohol/week: 0.0 standard drinks    Comment: rarely  . Drug use: No  . Sexual activity: Never    Partners: Male    Birth control/protection: Surgical    Comment: hyst.....Marland KitchenINTERCOURSE AGE 87, SEXUAL PARTNERS LESS THAN 5

## 2020-04-07 LAB — COMPREHENSIVE METABOLIC PANEL
AG Ratio: 1.5 (calc) (ref 1.0–2.5)
ALT: 17 U/L (ref 6–29)
AST: 24 U/L (ref 10–35)
Albumin: 3.9 g/dL (ref 3.6–5.1)
Alkaline phosphatase (APISO): 123 U/L (ref 37–153)
BUN: 10 mg/dL (ref 7–25)
CO2: 28 mmol/L (ref 20–32)
Calcium: 8.8 mg/dL (ref 8.6–10.4)
Chloride: 107 mmol/L (ref 98–110)
Creat: 0.68 mg/dL (ref 0.50–1.05)
Globulin: 2.6 g/dL (calc) (ref 1.9–3.7)
Glucose, Bld: 149 mg/dL — ABNORMAL HIGH (ref 65–99)
Potassium: 4.1 mmol/L (ref 3.5–5.3)
Sodium: 143 mmol/L (ref 135–146)
Total Bilirubin: 0.9 mg/dL (ref 0.2–1.2)
Total Protein: 6.5 g/dL (ref 6.1–8.1)

## 2020-04-07 LAB — CBC WITH DIFFERENTIAL/PLATELET
Absolute Monocytes: 528 cells/uL (ref 200–950)
Basophils Absolute: 18 cells/uL (ref 0–200)
Basophils Relative: 0.3 %
Eosinophils Absolute: 108 cells/uL (ref 15–500)
Eosinophils Relative: 1.8 %
HCT: 43.5 % (ref 35.0–45.0)
Hemoglobin: 14 g/dL (ref 11.7–15.5)
Lymphs Abs: 1494 cells/uL (ref 850–3900)
MCH: 30 pg (ref 27.0–33.0)
MCHC: 32.2 g/dL (ref 32.0–36.0)
MCV: 93.1 fL (ref 80.0–100.0)
MPV: 11 fL (ref 7.5–12.5)
Monocytes Relative: 8.8 %
Neutro Abs: 3852 cells/uL (ref 1500–7800)
Neutrophils Relative %: 64.2 %
Platelets: 102 10*3/uL — ABNORMAL LOW (ref 140–400)
RBC: 4.67 10*6/uL (ref 3.80–5.10)
RDW: 14.8 % (ref 11.0–15.0)
Total Lymphocyte: 24.9 %
WBC: 6 10*3/uL (ref 3.8–10.8)

## 2020-04-07 LAB — THYROID PANEL WITH TSH
Free Thyroxine Index: 3.5 (ref 1.4–3.8)
T3 Uptake: 25 % (ref 22–35)
T4, Total: 14 ug/dL — ABNORMAL HIGH (ref 5.1–11.9)
TSH: 2.95 mIU/L

## 2020-04-07 LAB — HEMOGLOBIN A1C
Hgb A1c MFr Bld: 5.5 % of total Hgb (ref <5.7)
Mean Plasma Glucose: 111 (calc)
eAG (mmol/L): 6.2 (calc)

## 2020-04-07 LAB — HEPATITIS C ANTIBODY
Hepatitis C Ab: NONREACTIVE
SIGNAL TO CUT-OFF: 0.02 (ref <1.00)

## 2020-04-08 ENCOUNTER — Telehealth: Payer: Self-pay | Admitting: Family Medicine

## 2020-04-08 NOTE — Telephone Encounter (Signed)
Labs show:  A1c looks good at 5.5.  CBC looks good.  Thyroid function is in normal range, but there is some room for improvement with TSH value.  If desired, could increase Synthroid dosage.  All else looks good.

## 2020-04-08 NOTE — Telephone Encounter (Signed)
Would you have time to call her?

## 2020-04-08 NOTE — Telephone Encounter (Signed)
Patient called.   She is requesting a call back from Dr. Junius Roads to discuss the results of her labwork.   Call back: [phone]

## 2020-04-09 ENCOUNTER — Other Ambulatory Visit: Payer: Self-pay | Admitting: Family Medicine

## 2020-04-10 ENCOUNTER — Encounter: Payer: Self-pay | Admitting: Family Medicine

## 2020-04-11 ENCOUNTER — Other Ambulatory Visit (HOSPITAL_COMMUNITY)
Admission: RE | Admit: 2020-04-11 | Discharge: 2020-04-11 | Disposition: A | Discharge status: Home / Self Care | Payer: Medicare HMO | Source: Ambulatory Visit | Attending: Gastroenterology | Admitting: Gastroenterology

## 2020-04-11 DIAGNOSIS — Z20822 Contact with and (suspected) exposure to covid-19: Secondary | ICD-10-CM | POA: Insufficient documentation

## 2020-04-11 DIAGNOSIS — Z01812 Encounter for preprocedural laboratory examination: Secondary | ICD-10-CM | POA: Insufficient documentation

## 2020-04-11 LAB — SARS CORONAVIRUS 2 (TAT 6-24 HRS): SARS Coronavirus 2: NEGATIVE

## 2020-04-14 ENCOUNTER — Ambulatory Visit (HOSPITAL_COMMUNITY): Payer: Medicare HMO | Admitting: Anesthesiology

## 2020-04-14 ENCOUNTER — Ambulatory Visit (HOSPITAL_COMMUNITY)
Admission: RE | Admit: 2020-04-14 | Discharge: 2020-04-14 | Disposition: A | Discharge status: Home / Self Care | Payer: Medicare HMO | Attending: Gastroenterology | Admitting: Gastroenterology

## 2020-04-14 ENCOUNTER — Encounter (HOSPITAL_COMMUNITY): Payer: Self-pay | Admitting: Gastroenterology

## 2020-04-14 ENCOUNTER — Encounter (HOSPITAL_COMMUNITY)
Admission: RE | Disposition: A | Discharge status: Home / Self Care | Payer: Self-pay | Source: Home / Self Care | Attending: Gastroenterology

## 2020-04-14 ENCOUNTER — Other Ambulatory Visit: Payer: Self-pay

## 2020-04-14 DIAGNOSIS — Z823 Family history of stroke: Secondary | ICD-10-CM | POA: Insufficient documentation

## 2020-04-14 DIAGNOSIS — Z833 Family history of diabetes mellitus: Secondary | ICD-10-CM | POA: Diagnosis not present

## 2020-04-14 DIAGNOSIS — Z90710 Acquired absence of both cervix and uterus: Secondary | ICD-10-CM | POA: Diagnosis not present

## 2020-04-14 DIAGNOSIS — K589 Irritable bowel syndrome without diarrhea: Secondary | ICD-10-CM | POA: Diagnosis not present

## 2020-04-14 DIAGNOSIS — R7303 Prediabetes: Secondary | ICD-10-CM | POA: Insufficient documentation

## 2020-04-14 DIAGNOSIS — Z1211 Encounter for screening for malignant neoplasm of colon: Secondary | ICD-10-CM | POA: Diagnosis present

## 2020-04-14 DIAGNOSIS — D123 Benign neoplasm of transverse colon: Secondary | ICD-10-CM | POA: Diagnosis not present

## 2020-04-14 DIAGNOSIS — J45909 Unspecified asthma, uncomplicated: Secondary | ICD-10-CM | POA: Diagnosis not present

## 2020-04-14 DIAGNOSIS — Z7951 Long term (current) use of inhaled steroids: Secondary | ICD-10-CM | POA: Insufficient documentation

## 2020-04-14 DIAGNOSIS — Z856 Personal history of leukemia: Secondary | ICD-10-CM | POA: Diagnosis not present

## 2020-04-14 DIAGNOSIS — Z79899 Other long term (current) drug therapy: Secondary | ICD-10-CM | POA: Insufficient documentation

## 2020-04-14 DIAGNOSIS — Z85828 Personal history of other malignant neoplasm of skin: Secondary | ICD-10-CM | POA: Insufficient documentation

## 2020-04-14 DIAGNOSIS — E890 Postprocedural hypothyroidism: Secondary | ICD-10-CM | POA: Diagnosis not present

## 2020-04-14 DIAGNOSIS — Z8379 Family history of other diseases of the digestive system: Secondary | ICD-10-CM | POA: Insufficient documentation

## 2020-04-14 DIAGNOSIS — R1310 Dysphagia, unspecified: Secondary | ICD-10-CM | POA: Diagnosis not present

## 2020-04-14 DIAGNOSIS — K317 Polyp of stomach and duodenum: Secondary | ICD-10-CM | POA: Insufficient documentation

## 2020-04-14 DIAGNOSIS — K449 Diaphragmatic hernia without obstruction or gangrene: Secondary | ICD-10-CM | POA: Diagnosis not present

## 2020-04-14 DIAGNOSIS — D509 Iron deficiency anemia, unspecified: Secondary | ICD-10-CM | POA: Diagnosis not present

## 2020-04-14 DIAGNOSIS — K219 Gastro-esophageal reflux disease without esophagitis: Secondary | ICD-10-CM | POA: Diagnosis not present

## 2020-04-14 DIAGNOSIS — D696 Thrombocytopenia, unspecified: Secondary | ICD-10-CM | POA: Diagnosis not present

## 2020-04-14 DIAGNOSIS — Z882 Allergy status to sulfonamides status: Secondary | ICD-10-CM | POA: Insufficient documentation

## 2020-04-14 DIAGNOSIS — Z7982 Long term (current) use of aspirin: Secondary | ICD-10-CM | POA: Insufficient documentation

## 2020-04-14 DIAGNOSIS — K259 Gastric ulcer, unspecified as acute or chronic, without hemorrhage or perforation: Secondary | ICD-10-CM | POA: Insufficient documentation

## 2020-04-14 DIAGNOSIS — D122 Benign neoplasm of ascending colon: Secondary | ICD-10-CM | POA: Diagnosis not present

## 2020-04-14 DIAGNOSIS — Z6841 Body Mass Index (BMI) 40.0 and over, adult: Secondary | ICD-10-CM | POA: Diagnosis not present

## 2020-04-14 DIAGNOSIS — D124 Benign neoplasm of descending colon: Secondary | ICD-10-CM | POA: Diagnosis not present

## 2020-04-14 DIAGNOSIS — Z87891 Personal history of nicotine dependence: Secondary | ICD-10-CM | POA: Diagnosis not present

## 2020-04-14 DIAGNOSIS — Z7984 Long term (current) use of oral hypoglycemic drugs: Secondary | ICD-10-CM | POA: Insufficient documentation

## 2020-04-14 DIAGNOSIS — Z803 Family history of malignant neoplasm of breast: Secondary | ICD-10-CM | POA: Insufficient documentation

## 2020-04-14 DIAGNOSIS — Z801 Family history of malignant neoplasm of trachea, bronchus and lung: Secondary | ICD-10-CM | POA: Diagnosis not present

## 2020-04-14 DIAGNOSIS — Z881 Allergy status to other antibiotic agents status: Secondary | ICD-10-CM | POA: Insufficient documentation

## 2020-04-14 DIAGNOSIS — Z888 Allergy status to other drugs, medicaments and biological substances status: Secondary | ICD-10-CM | POA: Insufficient documentation

## 2020-04-14 DIAGNOSIS — Z820 Family history of epilepsy and other diseases of the nervous system: Secondary | ICD-10-CM | POA: Insufficient documentation

## 2020-04-14 DIAGNOSIS — Z8249 Family history of ischemic heart disease and other diseases of the circulatory system: Secondary | ICD-10-CM | POA: Insufficient documentation

## 2020-04-14 DIAGNOSIS — Z86010 Personal history of colonic polyps: Secondary | ICD-10-CM | POA: Diagnosis not present

## 2020-04-14 DIAGNOSIS — Z880 Allergy status to penicillin: Secondary | ICD-10-CM | POA: Insufficient documentation

## 2020-04-14 HISTORY — PX: SCLEROTHERAPY: SHX6841

## 2020-04-14 HISTORY — PX: ESOPHAGOGASTRODUODENOSCOPY (EGD) WITH PROPOFOL: SHX5813

## 2020-04-14 HISTORY — PX: COLONOSCOPY WITH PROPOFOL: SHX5780

## 2020-04-14 HISTORY — PX: POLYPECTOMY: SHX5525

## 2020-04-14 HISTORY — PX: BIOPSY: SHX5522

## 2020-04-14 LAB — GLUCOSE, CAPILLARY: Glucose-Capillary: 95 mg/dL (ref 70–99)

## 2020-04-14 SURGERY — ESOPHAGOGASTRODUODENOSCOPY (EGD) WITH PROPOFOL
Anesthesia: Monitor Anesthesia Care

## 2020-04-14 MED ORDER — SODIUM CHLORIDE 0.9 % IV SOLN: INTRAVENOUS | Status: DC

## 2020-04-14 MED ORDER — PHENYLEPHRINE 40 MCG/ML (10ML) SYRINGE FOR IV PUSH (FOR BLOOD PRESSURE SUPPORT): PREFILLED_SYRINGE | INTRAVENOUS | Status: DC | PRN

## 2020-04-14 MED ORDER — PROPOFOL 500 MG/50ML IV EMUL
INTRAVENOUS | Status: AC
  Filled 2020-04-14: qty 50

## 2020-04-14 MED ORDER — PROPOFOL 500 MG/50ML IV EMUL
INTRAVENOUS | Status: DC | PRN
  Administered 2020-04-14: 100 ug/kg/min via INTRAVENOUS

## 2020-04-14 MED ORDER — SODIUM CHLORIDE (PF) 0.9 % IJ SOLN
PREFILLED_SYRINGE | INTRAMUSCULAR | Status: DC | PRN
  Administered 2020-04-14: 1.5 mL

## 2020-04-14 MED ORDER — LACTATED RINGERS IV SOLN
INTRAVENOUS | Status: DC
  Administered 2020-04-14: 1000 mL via INTRAVENOUS

## 2020-04-14 MED ORDER — LIDOCAINE 2% (20 MG/ML) 5 ML SYRINGE
INTRAMUSCULAR | Status: DC | PRN
  Administered 2020-04-14: 50 mg via INTRAVENOUS

## 2020-04-14 SURGICAL SUPPLY — 25 items

## 2020-04-14 NOTE — Anesthesia Preprocedure Evaluation (Signed)
Anesthesia Evaluation  Patient identified by MRN, date of birth, ID band Patient awake    Reviewed: Allergy & Precautions, NPO status , Patient's Chart, lab work & pertinent test results  Airway Mallampati: II  TM Distance: >3 FB Neck ROM: Full    Dental no notable dental hx.    Pulmonary asthma , former smoker,    breath sounds clear to auscultation + decreased breath sounds      Cardiovascular negative cardio ROS Normal cardiovascular exam Rhythm:Regular Rate:Normal     Neuro/Psych negative neurological ROS  negative psych ROS   GI/Hepatic Neg liver ROS, GERD  ,  Endo/Other  diabetesMorbid obesity  Renal/GU negative Renal ROS  negative genitourinary   Musculoskeletal negative musculoskeletal ROS (+)   Abdominal   Peds negative pediatric ROS (+)  Hematology negative hematology ROS (+)   Anesthesia Other Findings   Reproductive/Obstetrics negative OB ROS                             Anesthesia Physical Anesthesia Plan  ASA: III  Anesthesia Plan: MAC   Post-op Pain Management:    Induction: Intravenous  PONV Risk Score and Plan: 0  Airway Management Planned: Simple Face Mask  Additional Equipment:   Intra-op Plan:   Post-operative Plan:   Informed Consent: I have reviewed the patients History and Physical, chart, labs and discussed the procedure including the risks, benefits and alternatives for the proposed anesthesia with the patient or authorized representative who has indicated his/her understanding and acceptance.     Dental advisory given  Plan Discussed with: CRNA and Surgeon  Anesthesia Plan Comments:         Anesthesia Quick Evaluation

## 2020-04-14 NOTE — Anesthesia Postprocedure Evaluation (Signed)
Anesthesia Post Note  Patient: Marissa [name]  Procedure(s) Performed: ESOPHAGOGASTRODUODENOSCOPY (EGD) WITH PROPOFOL (N/A ) COLONOSCOPY WITH PROPOFOL (N/A ) BIOPSY POLYPECTOMY SCLEROTHERAPY     Patient location during evaluation: PACU Anesthesia Type: MAC Level of consciousness: awake and alert Pain management: pain level controlled Vital Signs Assessment: post-procedure vital signs reviewed and stable Respiratory status: spontaneous breathing, nonlabored ventilation, respiratory function stable and patient connected to nasal cannula oxygen Cardiovascular status: stable and blood pressure returned to baseline Postop Assessment: no apparent nausea or vomiting Anesthetic complications: no   No complications documented.  Last Vitals:  Vitals:   04/14/20 0718 04/14/20 0945  BP: (!) 175/76 (!) 142/48  Pulse: 86 97  Resp: 20 (!) 23  Temp: 37.1 C 36.7 C  SpO2: 96% 100%    Last Pain:  Vitals:   04/14/20 0945  TempSrc: Oral  PainSc: 0-No pain                 Arayla Kruschke S

## 2020-04-14 NOTE — Discharge Instructions (Signed)

## 2020-04-14 NOTE — H&P (Signed)
Marissa [name] is an 54 y.o. female.   Chief Complaint: Iron deficiency anemia HPI: This is a very pleasant but morbidly obese 54 year old female who had colonoscopy 3-1/2 years ago showing 4 diminutive adenomatous polyps, the largest actually 10 mm in size, for which a 3-year repeat was requested.  She also has a longstanding history of GERD which is under excellent control with Protonix once daily.  She does have intermittent dysphagia.  No visible GI bleeding although she was previously using moderate to large amounts of nonsteroidal anti-inflammatory drugs (none for the past 6 to 8 months).  She is on low-dose aspirin.  No recent endoscopy; and endoscopy 27 years ago showed mild distal esophagitis with a small hiatal hernia.  Her most recent CBC, approximately a week ago, showed an improved hemoglobin of 14.0, is care impaired to 10.1 back in April, following iron supplementation.  Interestingly, platelets are low at 102,000.  Her iron studies show improvement as well; ferritin level was 8 back in April, and it is now 86.  Past Medical History:  Diagnosis Date  . Abdominal pain    INTERMITANT  . Anemia   . Asthma   . GERD (gastroesophageal reflux disease)   . H/O hiatal hernia   . Hemorrhoids   . History of transfusion   . IBS (irritable bowel syndrome)   . 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 Plantar fasciitis   . Plantar fasciitis of left foot FALL 2012   HAD SURGERY TO FIX IT  . Prediabetes   . Rectal pain   . 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 Thyroid nodule     Past Surgical History:  Procedure Laterality Date  . ABDOMINAL HYSTERECTOMY  2003   BSO  . BIOPSY THYROID  11/2009   benign  . carpel tunnel  2004   rt hand  . COLONOSCOPY WITH PROPOFOL N/A 11/08/2016   Procedure: COLONOSCOPY WITH PROPOFOL;  Surgeon: Ronald Lobo, MD;  Location: [facility];  Service: Endoscopy;  Laterality: N/A;  . eye biospy  1979   dx with leukemia from this  biospy  . eye biospy  1979   rt eye, dx: leukemia  . EYE SURGERY  1982   rt eye  . NASAL SINUS SURGERY  1999  . PLANTAR FASCIA SURGERY  2004, 2012   rt foot, left foot  . RADICAL HYSTERECTOMY    . skin cancer removal on shoulder  2/14  . THYROID LOBECTOMY Left 09/25/2013   Procedure: LEFT THYROID LOBECTOMY;  Surgeon: Earnstine Regal, MD;  Location: [facility];  Service: General;  Laterality: Left;    Family History  Problem Relation Age of Onset  . Cancer Mother 77       LUNG  . Diabetes Mother   . Diabetes Father   . Stroke Father   . Heart disease Father   . Vision loss Father        GLAUCOMA  . Cancer Maternal Aunt        breast  . Breast cancer Maternal Aunt   . Diverticulosis Other   . Colon cancer Neg Hx    Social History:  reports that she quit smoking about 28 years ago. She has never used smokeless tobacco. She reports current alcohol use. She reports that she does not use drugs.  Allergies:  Allergies  Allergen Reactions  . Breo Ellipta [Fluticasone Furoate-Vilanterol] Anaphylaxis  . Erythromycin Shortness Of Breath  .  Penicillins Shortness Of Breath    Has patient had a PCN reaction causing immediate rash, facial/tongue/throat swelling, SOB or lightheadedness with hypotension: Yes Has patient had a PCN reaction causing severe rash involving mucus membranes or skin necrosis: Yes Has patient had a PCN reaction that required hospitalization Unknown Has patient had a PCN reaction occurring within the last 10 years: No If all of the above answers are "NO", then may proceed with Cephalosporin use.   . Quinine Derivatives Shortness Of Breath  . Sulfa Antibiotics Shortness Of Breath    Medications Prior to Admission  Medication Sig Dispense Refill  . aspirin 81 MG tablet Take 81 mg by mouth at bedtime.     . Cholecalciferol (VITAMIN D3) 125 MCG (5000 UT) CAPS Take 5,000 Units by mouth daily.     . Cranberry-Vitamin C-Vitamin E (CRANBERRY PLUS VITAMIN C PO) Take 1 tablet  by mouth daily.     Marland Kitchen EPINEPHrine (EPIPEN 2-PAK) 0.3 mg/0.3 mL IJ SOAJ injection Inject 0.3 mLs (0.3 mg total) into the muscle as needed for anaphylaxis. 1 each 11  . fexofenadine (ALLEGRA) 180 MG tablet Take 180 mg by mouth daily.     . fluticasone (FLONASE) 50 MCG/ACT nasal spray Place 1 spray into both nostrils daily.    . fluticasone (FLOVENT HFA) 110 MCG/ACT inhaler Inhale 1 puff into the lungs 2 (two) times daily.     Marland Kitchen HYDROcodone-acetaminophen (NORCO/VICODIN) 5-325 MG tablet Take 1 tablet by mouth 2 (two) times daily as needed for moderate pain. 60 tablet 0  . levothyroxine (SYNTHROID) 75 MCG tablet TAKE 1 TABLET EVERY DAY BEFORE BREAKFAST (Patient taking differently: Take 75 mcg by mouth daily before breakfast. ) 90 tablet 1  . Magnesium 250 MG TABS Take 250 mg by mouth daily.     . Menatetrenone (VITAMIN K2) 100 MCG TABS Take 100 mcg by mouth daily.     . metFORMIN (GLUCOPHAGE) 500 MG tablet TAKE 1 TABLET TWICE DAILY WITH FOOD 60 tablet 1  . montelukast (SINGULAIR) 10 MG tablet Take 10 mg by mouth at bedtime.   5  . nystatin (MYCOSTATIN/NYSTOP) powder Apply topically 3 (three) times daily. (Patient taking differently: Apply 1 application topically 2 (two) times daily. ) 15 g 4  . nystatin-triamcinolone ointment (MYCOLOG) Apply 1 application topically 2 (two) times daily. 60 g 6  . pantoprazole (PROTONIX) 40 MG tablet Take 1 tablet (40 mg total) by mouth daily. 90 tablet 3  . pregabalin (LYRICA) 150 MG capsule Take 1 capsule (150 mg total) by mouth 2 (two) times daily. 60 capsule 6  . PROAIR HFA 108 (90 BASE) MCG/ACT inhaler Inhale 1-2 puffs into the lungs every 6 (six) hours as needed for wheezing or shortness of breath.   0  . Probiotic Product (PROBIOTIC DAILY PO) Take 1 capsule by mouth daily.     . traMADol (ULTRAM) 50 MG tablet Take 1 tablet (50 mg total) by mouth 2 (two) times daily as needed. 60 tablet 0  . cyclobenzaprine (FLEXERIL) 10 MG tablet Take 1 tablet (10 mg total) by  mouth 3 (three) times daily as needed for muscle spasms. (Patient not taking: Reported on 03/31/2020) 90 tablet 3    Results for orders placed or performed during the hospital encounter of 04/14/20 (from the past 48 hour(s))  Glucose, capillary     Status: None   Collection Time: 04/14/20  7:39 AM  Result Value Ref Range   Glucose-Capillary 95 70 - 99 mg/dL    Comment:  Glucose reference range applies only to samples taken after fasting for at least 8 hours.   No results found.  Review of Systems see HPI  Blood pressure (!) 175/76, pulse 86, temperature 98.7 F (37.1 C), temperature source Oral, resp. rate 20, height 5\' 3"  (1.6 m), weight (!) 152 kg, last menstrual period 07/23/2002, SpO2 96 %. Physical Exam no distress whatsoever.  No pallor, no icterus.  Chest clear anteriorly.  Heart normal without murmur or arrhythmia.  Abdomen nontender, with a massive panniculus.  No evident focal neurologic deficits or cognitive dysfunction.  Assessment/Plan 1.  Documented iron deficiency anemia, Hemoccult status unknown, improved following iron supplementation 2.  History of several small to medium size adenomas, due for colonoscopic surveillance 3.  Intermittent dysphagia, not that troublesome for the patient, she does not feel the need for dilatation  We will proceed to diagnostic endoscopy and colonoscopy.  The risks are familiar to the patient from prior examination and she is agreeable to proceed.  Cleotis Nipper, MD 04/14/2020, 8:30 AM

## 2020-04-14 NOTE — Transfer of Care (Signed)
Immediate Anesthesia Transfer of Care Note  Patient: Marissa [name]  Procedure(s) Performed: Procedure(s): ESOPHAGOGASTRODUODENOSCOPY (EGD) WITH PROPOFOL (N/A) COLONOSCOPY WITH PROPOFOL (N/A) BIOPSY POLYPECTOMY SCLEROTHERAPY  Patient Location: PACU  Anesthesia Type:MAC  Level of Consciousness:  sedated, patient cooperative and responds to stimulation  Airway & Oxygen Therapy:Patient Spontanous Breathing and Patient connected to face mask oxgen  Post-op Assessment:  Report given to PACU RN and Post -op Vital signs reviewed and stable  Post vital signs:  Reviewed and stable  Last Vitals:  Vitals:   04/14/20 0718 04/14/20 0945  BP: (!) 175/76 (!) 142/48  Pulse: 86 97  Resp: 20 (!) 23  Temp: 37.1 C 36.7 C  SpO2: 56% 314%    Complications: No apparent anesthesia complications

## 2020-04-14 NOTE — Op Note (Signed)
[facility] Patient Name: Marissa [name] Procedure Date: 04/14/2020 MRN: [id number] Attending MD: Ronald Lobo , MD Date of Birth: Sep 24, 1966 CSN: [id number] Age: 54 Admit Type: Outpatient Procedure:                Upper GI endoscopy Indications:              Iron deficiency anemia. Patient also has history of                            GERD (well-controlled on PPI) and transient                            intermittent dysphagia. Providers:                Ronald Lobo, MD, Angus Seller, Cherylynn Ridges,                            Technician, Anne Fu CRNA, CRNA Referring MD:              Medicines:                Monitored Anesthesia Care Complications:            No immediate complications. Estimated Blood Loss:     Estimated blood loss: 2 mL. Procedure:                Pre-Anesthesia Assessment:                           - Prior to the procedure, a History and Physical                            was performed, and patient medications and                            allergies were reviewed. The patient's tolerance of                            previous anesthesia was also reviewed. The risks                            and benefits of the procedure and the sedation                            options and risks were discussed with the patient.                            All questions were answered, and informed consent                            was obtained. Prior Anticoagulants: The patient has                            taken no previous anticoagulant or antiplatelet  agents except for aspirin. ASA Grade Assessment:                            III - A patient with severe systemic disease. After                            reviewing the risks and benefits, the patient was                            deemed in satisfactory condition to undergo the                            procedure.                           After obtaining informed  consent, the endoscope was                            passed under direct vision. Throughout the                            procedure, the patient's blood pressure, pulse, and                            oxygen saturations were monitored continuously. The                            [id number]-H190 ([id number]) Olympus gastroscope was                            introduced through the mouth, and advanced to the                            second part of duodenum. The upper GI endoscopy was                            accomplished without difficulty. The patient                            tolerated the procedure well. Scope In: Scope Out: Findings:      The examined esophagus was normal. An inlet patch of Barrett's mucosa       was seen, but not biopsied.      A 1 cm hiatal hernia was present.      There is no endoscopic evidence of esophagitis or stricture in the       entire esophagus.      Bilious fluid was found on the greater curvature of the stomach.      Multiple 4 to 10 mm semi-sessile polyps, including one with minimal       oozing on the greater curve of the mid-body, were found in the gastric       body, in the gastric antrum and in the prepyloric region of the stomach.       Several of the polyps in the prepyloric area were somewhat friable.  Biopsies were taken with a cold forceps for histology. Estimated blood       loss: 2 mL.      The exam of the stomach was otherwise normal.      The cardia and gastric fundus were normal on retroflexion.      The examined duodenum was normal. Impression:               - Normal esophagus.                           - 1 cm hiatal hernia.                           - Bilious gastric fluid.                           - Multiple gastric polyps. Biopsied. Due to                            friability and minimal oozing noted, in conjunction                            with patient's thrombocytopenia and iron                            malabsorption from  PPI therapy, it is possible that                            this could account for the patient's iron                            deficiency anemia.                           - Normal examined duodenum. Moderate Sedation:      This patient was sedated with monitored anesthesia care, not moderate       sedation. Recommendation:           - Await pathology results.                           - Continue present medications.                           - Perform a colonoscopy today. Procedure Code(s):        --- Professional ---                           [phone], Esophagogastroduodenoscopy, flexible,                            transoral; with biopsy, single or multiple Diagnosis Code(s):        --- Professional ---                           K31.7, Polyp of stomach and duodenum  D50.9, Iron deficiency anemia, unspecified CPT copyright 2019 American [employer]. All rights reserved. The codes documented in this report are preliminary and upon coder review may  be revised to meet current compliance requirements. Ronald Lobo, MD 04/14/2020 10:01:18 AM This report has been signed electronically. Number of Addenda: 0

## 2020-04-14 NOTE — Op Note (Signed)
[facility] Patient Name: Marissa [name] Procedure Date: 04/14/2020 MRN: [id number] Attending MD: Ronald Lobo , MD Date of Birth: 08-23-1966 CSN: [id number] Age: 54 Admit Type: Outpatient Procedure:                Colonoscopy Indications:              High risk colon cancer surveillance: Personal                            history of multiple (3 or more) adenomas, Last                            colonoscopy: February 2018; also, iron deficiency                            anemia responsive to iron infusion therapy. Providers:                Ronald Lobo, MD, Angus Seller, Cherylynn Ridges,                            Technician, Anne Fu CRNA, CRNA Referring MD:              Medicines:                Monitored Anesthesia Care Complications:            No immediate complications. Estimated Blood Loss:     Estimated blood loss: 4 mL. Procedure:                Pre-Anesthesia Assessment:                           - Prior to the procedure, a History and Physical                            was performed, and patient medications and                            allergies were reviewed. The patient's tolerance of                            previous anesthesia was also reviewed. The risks                            and benefits of the procedure and the sedation                            options and risks were discussed with the patient.                            All questions were answered, and informed consent                            was obtained. Prior Anticoagulants: The patient has  taken no previous anticoagulant or antiplatelet                            agents except for aspirin. ASA Grade Assessment:                            III - A patient with severe systemic disease. After                            reviewing the risks and benefits, the patient was                            deemed in satisfactory condition to undergo the                             procedure.                           - Prior to the procedure, a History and Physical                            was performed, and patient medications and                            allergies were reviewed. The patient's tolerance of                            previous anesthesia was also reviewed. The risks                            and benefits of the procedure and the sedation                            options and risks were discussed with the patient.                            All questions were answered, and informed consent                            was obtained. Prior Anticoagulants: The patient has                            taken no previous anticoagulant or antiplatelet                            agents except for aspirin. ASA Grade Assessment:                            III - A patient with severe systemic disease. After                            reviewing the risks and benefits, the patient was  deemed in satisfactory condition to undergo the                            procedure.                           After obtaining informed consent, the colonoscope                            was passed under direct vision. Throughout the                            procedure, the patient's blood pressure, pulse, and                            oxygen saturations were monitored continuously. The                            [id number]-HQ190L ([id number]) Olympus colonoscope was                            introduced through the anus and advanced to the the                            terminal ileum. The colonoscopy was performed with                            ease. The patient tolerated the procedure well. The                            quality of the bowel preparation was good. Scope In: 9:09:07 AM Scope Out: 9:36:09 AM Scope Withdrawal Time: 0 hours 24 minutes 3 seconds  Total Procedure Duration: 0 hours 27 minutes 2 seconds  Findings:      The  perianal and digital rectal examinations were normal.      Three sessile polyps were found in the descending colon, hepatic flexure       and ascending colon. The polyps were 4-9 mm in size. These polyps were       removed with a cold snare. Resection and retrieval were complete.       Estimated blood loss: 4 mL requiring treatment with epinephrine at one       of the proximal polypectomy sites because of persistent oozing.      No other significant abnormalities were identified in a careful       examination of the remainder of the colon. The proximal colon was       re-examined.      There is no endoscopic evidence of diverticula, inflammation or mass in       the entire colon.      The terminal ileum appeared normal.      The retroflexed view of the distal rectum and anal verge was normal and       showed no anal or rectal abnormalities. Impression:               - Three 4-9 mm polyps in the descending colon, at  the hepatic flexure and in the ascending colon,                            removed with a cold snare. Resected and retrieved.                           - The examined portion of the ileum was normal.                           - The distal rectum and anal verge are normal on                            retroflexion view. Moderate Sedation:      This patient was sedated with monitored anesthesia care, not moderate       sedation. Recommendation:           - Await pathology results.                           - If the pathology report reveals adenomatous                            tissue, then repeat the colonoscopy for                            surveillance in 3 - 5 years. Procedure Code(s):        --- Professional ---                           [phone], Colonoscopy, flexible; with removal of                            tumor(s), polyp(s), or other lesion(s) by snare                            technique Diagnosis Code(s):        --- Professional ---                            Z86.010, Personal history of colonic polyps                           K63.5, Polyp of colon CPT copyright 2019 American Medical Association. All rights reserved. The codes documented in this report are preliminary and upon coder review may  be revised to meet current compliance requirements. Ronald Lobo, MD 04/14/2020 10:38:07 AM This report has been signed electronically. Number of Addenda: 0

## 2020-04-15 ENCOUNTER — Encounter (HOSPITAL_COMMUNITY): Payer: Self-pay | Admitting: Gastroenterology

## 2020-04-15 LAB — SURGICAL PATHOLOGY

## 2020-05-02 ENCOUNTER — Other Ambulatory Visit: Payer: Self-pay

## 2020-05-02 ENCOUNTER — Inpatient Hospital Stay: Payer: Medicare HMO | Attending: Hematology and Oncology

## 2020-05-02 VITALS — BP 122/60 | HR 92 | Temp 99.1°F | Resp 18

## 2020-05-02 DIAGNOSIS — E538 Deficiency of other specified B group vitamins: Secondary | ICD-10-CM | POA: Diagnosis present

## 2020-05-02 DIAGNOSIS — D509 Iron deficiency anemia, unspecified: Secondary | ICD-10-CM

## 2020-05-02 MED ORDER — CYANOCOBALAMIN 1000 MCG/ML IJ SOLN
1000.0000 ug | Freq: Once | INTRAMUSCULAR | Status: AC
  Administered 2020-05-02: 1000 ug via INTRAMUSCULAR

## 2020-05-02 MED ORDER — CYANOCOBALAMIN 1000 MCG/ML IJ SOLN
INTRAMUSCULAR | Status: AC
  Filled 2020-05-02: qty 1

## 2020-05-02 NOTE — Patient Instructions (Signed)
Cyanocobalamin, Pyridoxine, and Folate What is this medicine? A multivitamin containing folic acid, vitamin B6, and vitamin B12. This medicine may be used for other purposes; ask your health care provider or pharmacist if you have questions. COMMON BRAND NAME(S): AllanFol RX, AllanTex, Av-Vite FB, B Complex with Folic Acid, ComBgen, FaBB, Folamin, Folastin, Folbalin, Folbee, Folbic, Folcaps, Folgard, Folgard RX, Folgard RX 2.2, Folplex, Folplex 2.2, Foltabs 800, Foltx, Homocysteine Formula, Niva-Fol, NuFol, TL Gard RX, Virt-Gard, Virt-Vite, Virt-Vite Forte, Vita-Respa What should I tell my health care provider before I take this medicine? They need to know if you have any of these conditions:  bleeding or clotting disorder  history of anemia of any type  other chronic health condition  an unusual or allergic reaction to vitamins, other medicines, foods, dyes, or preservatives  pregnant or trying to get pregnant  breast-feeding How should I use this medicine? Take by mouth with a glass of water. May take with food. Follow the directions on the prescription label. It is usually given once a day. Do not take your medicine more often than directed. Contact your pediatrician regarding the use of this medicine in children. Special care may be needed. Overdosage: If you think you have taken too much of this medicine contact a poison control center or emergency room at once. NOTE: This medicine is only for you. Do not share this medicine with others. What if I miss a dose? If you miss a dose, take it as soon as you can. If it is almost time for your next dose, take only that dose. Do not take double or extra doses. What may interact with this medicine?  levodopa This list may not describe all possible interactions. Give your health care provider a list of all the medicines, herbs, non-prescription drugs, or dietary supplements you use. Also tell them if you smoke, drink alcohol, or use illegal  drugs. Some items may interact with your medicine. What should I watch for while using this medicine? See your health care professional for regular checks on your progress. Remember that vitamin supplements do not replace the need for good nutrition from a balanced diet. What side effects may I notice from receiving this medicine? Side effects that you should report to your doctor or health care professional as soon as possible:  allergic reaction such as skin rash or difficulty breathing  vomiting Side effects that usually do not require medical attention (report to your doctor or health care professional if they continue or are bothersome):  nausea  stomach upset This list may not describe all possible side effects. Call your doctor for medical advice about side effects. You may report side effects to FDA at 1-800-FDA-1088. Where should I keep my medicine? Keep out of the reach of children. Most vitamins should be stored at controlled room temperature. Check your specific product directions. Protect from heat and moisture. Throw away any unused medicine after the expiration date. NOTE: This sheet is a summary. It may not cover all possible information. If you have questions about this medicine, talk to your doctor, pharmacist, or health care provider.  2020 Elsevier/Gold Standard (2007-11-01 00:59:55)  

## 2020-05-04 ENCOUNTER — Other Ambulatory Visit: Payer: Self-pay | Admitting: Family Medicine

## 2020-05-11 ENCOUNTER — Other Ambulatory Visit: Payer: Self-pay | Admitting: Family Medicine

## 2020-05-11 DIAGNOSIS — Z1231 Encounter for screening mammogram for malignant neoplasm of breast: Secondary | ICD-10-CM

## 2020-05-30 ENCOUNTER — Other Ambulatory Visit: Payer: Self-pay | Admitting: Family Medicine

## 2020-06-02 ENCOUNTER — Inpatient Hospital Stay: Payer: Medicare HMO | Attending: Hematology and Oncology

## 2020-06-02 ENCOUNTER — Other Ambulatory Visit: Payer: Self-pay

## 2020-06-02 ENCOUNTER — Telehealth: Payer: Self-pay | Admitting: Hematology and Oncology

## 2020-06-02 VITALS — BP 147/73 | HR 94 | Resp 18

## 2020-06-02 DIAGNOSIS — D509 Iron deficiency anemia, unspecified: Secondary | ICD-10-CM

## 2020-06-02 DIAGNOSIS — E538 Deficiency of other specified B group vitamins: Secondary | ICD-10-CM | POA: Diagnosis present

## 2020-06-02 MED ORDER — CYANOCOBALAMIN 1000 MCG/ML IJ SOLN
1000.0000 ug | Freq: Once | INTRAMUSCULAR | Status: AC
  Administered 2020-06-02: 1000 ug via INTRAMUSCULAR

## 2020-06-02 MED ORDER — CYANOCOBALAMIN 1000 MCG/ML IJ SOLN
INTRAMUSCULAR | Status: AC
  Filled 2020-06-02: qty 1

## 2020-06-02 NOTE — Telephone Encounter (Signed)
Rescheduled appointment per patient request. Patient is aware of new time, but declined calendar print out.

## 2020-06-02 NOTE — Patient Instructions (Signed)

## 2020-06-12 ENCOUNTER — Other Ambulatory Visit: Payer: Self-pay | Admitting: Family Medicine

## 2020-06-23 ENCOUNTER — Other Ambulatory Visit: Payer: Self-pay

## 2020-06-23 ENCOUNTER — Ambulatory Visit
Admission: RE | Admit: 2020-06-23 | Discharge: 2020-06-23 | Disposition: A | Discharge status: Home / Self Care | Payer: Medicare HMO | Source: Ambulatory Visit | Attending: Family Medicine | Admitting: Family Medicine

## 2020-06-23 DIAGNOSIS — Z1231 Encounter for screening mammogram for malignant neoplasm of breast: Secondary | ICD-10-CM

## 2020-06-27 ENCOUNTER — Telehealth: Payer: Self-pay | Admitting: Family Medicine

## 2020-06-27 NOTE — Telephone Encounter (Signed)
Mammogram looks normal

## 2020-07-01 ENCOUNTER — Inpatient Hospital Stay: Payer: Medicare HMO | Attending: Hematology and Oncology

## 2020-07-01 ENCOUNTER — Ambulatory Visit: Payer: Medicare HMO

## 2020-07-01 ENCOUNTER — Other Ambulatory Visit: Payer: Self-pay

## 2020-07-01 VITALS — BP 135/77 | HR 95 | Temp 99.3°F | Resp 18

## 2020-07-01 DIAGNOSIS — D509 Iron deficiency anemia, unspecified: Secondary | ICD-10-CM

## 2020-07-01 DIAGNOSIS — E538 Deficiency of other specified B group vitamins: Secondary | ICD-10-CM | POA: Insufficient documentation

## 2020-07-01 LAB — HM DIABETES EYE EXAM

## 2020-07-01 MED ORDER — CYANOCOBALAMIN 1000 MCG/ML IJ SOLN
1000.0000 ug | Freq: Once | INTRAMUSCULAR | Status: AC
  Administered 2020-07-01: 1000 ug via INTRAMUSCULAR

## 2020-07-01 MED ORDER — CYANOCOBALAMIN 1000 MCG/ML IJ SOLN
INTRAMUSCULAR | Status: AC
  Filled 2020-07-01: qty 1

## 2020-07-01 NOTE — Patient Instructions (Signed)

## 2020-07-06 ENCOUNTER — Encounter: Payer: Self-pay | Admitting: Family Medicine

## 2020-07-06 ENCOUNTER — Telehealth: Payer: Self-pay

## 2020-07-06 ENCOUNTER — Ambulatory Visit (INDEPENDENT_AMBULATORY_CARE_PROVIDER_SITE_OTHER): Payer: Medicare HMO | Admitting: Family Medicine

## 2020-07-06 ENCOUNTER — Telehealth: Payer: Self-pay | Admitting: *Deleted

## 2020-07-06 ENCOUNTER — Other Ambulatory Visit: Payer: Self-pay

## 2020-07-06 VITALS — BP 135/75 | HR 93 | Ht 63.0 in | Wt 345.8 lb

## 2020-07-06 DIAGNOSIS — E538 Deficiency of other specified B group vitamins: Secondary | ICD-10-CM | POA: Diagnosis not present

## 2020-07-06 DIAGNOSIS — M545 Low back pain, unspecified: Secondary | ICD-10-CM | POA: Diagnosis not present

## 2020-07-06 DIAGNOSIS — Z6841 Body Mass Index (BMI) 40.0 and over, adult: Secondary | ICD-10-CM

## 2020-07-06 DIAGNOSIS — E119 Type 2 diabetes mellitus without complications: Secondary | ICD-10-CM | POA: Diagnosis not present

## 2020-07-06 MED ORDER — CYANOCOBALAMIN 1000 MCG/ML IJ SOLN: 1000.0000 ug | INTRAMUSCULAR | 3 refills | Status: DC

## 2020-07-06 NOTE — Telephone Encounter (Signed)
-----   Message from Heath Lark, MD sent at 07/06/2020  2:51 PM EDT ----- Regarding: B12 injection She saw another MD today who called in B12 injection for her to do at home Please call her later and if she is comfortable doing it and insurance will pay for it we can cancel her injection appt here

## 2020-07-06 NOTE — Telephone Encounter (Signed)
He would like to see her back in 3 months for diabetes check and bloodwork.

## 2020-07-06 NOTE — Telephone Encounter (Signed)
Per Dr.Gorsuch, called pt with message below. Left vm for pt to return call to office.

## 2020-07-06 NOTE — Progress Notes (Signed)
Office Visit Note   Patient: Marissa [name]           Date of Birth: Feb 24, 1966           MRN: [id number] Visit Date: 07/06/2020 Requested by: Eunice Blase, MD [place],  [place] [address] PCP: Eunice Blase, MD  Subjective: Chief Complaint  Patient presents with  . 3 months f/u of medical issues  . fatigue despite vitamin B12 injections    HPI: She is here for routine monitoring.  From a B12 standpoint, once monthly injections are not giving her improvement in energy.  She feels better for a few days but then it wears off.  Doing okay from a diabetes standpoint.  She had her eye exam showing no sign of retinopathy.  She developed increased low back pain recently.  She is feeling better now.  She is interested in physical therapy.  She continues to have troubles with incontinence.  She plans to see her urologist to find out what other options are available.                ROS:   All other systems were reviewed and are negative.  Objective: Vital Signs: BP 135/75   Pulse 93   Ht 5\' 3"  (1.6 m)   Wt (!) 345 lb 12.8 oz (156.9 kg)   LMP 07/23/2002   BMI 61.26 kg/m   Physical Exam:  General:  Alert and oriented, in no acute distress. Pulm:  Breathing unlabored. Psy:  Normal mood, congruent affect. Skin: She has skin lesions on the front of her shins.  2+ edema in her legs. CV: Regular rate and rhythm without murmurs, rubs, or gallops.  2+ radial and posterior tibial pulses. Lungs: Clear to auscultation throughout with no wheezing or areas of consolidation.    Imaging: No results found.  Assessment & Plan: 1.  Diabetes, stable. -Return in 3 months for recheck on labs.  2.  B12 deficiency -She will do her own injections every 3 days to see if this helps.  3.  Low back pain -Physical therapy referral.        Procedures: No procedures performed  No notes on file     PMFS History: Patient Active Problem List   Diagnosis Date Noted  .  Vitamin B12 deficiency 04/01/2020  . Iron deficiency anemia 01/15/2020  . Deficiency anemia 01/15/2020  . Thrombocytopenia ([place]) 01/15/2020  . Diabetes ([place]) 09/07/2019  . Osteopenia of forearm 09/07/2019  . Gastroesophageal reflux disease 05/08/2019  . Hyperglycemia 05/08/2019  . Urinary incontinence 05/08/2019  . History of colon polyps 05/08/2019  . Mild intermittent asthma without complication 85/46/2703  . BV (bacterial vaginosis) 01/01/2013  . Anal fissure 07/07/2012  . History of acute lymphoblastic leukemia (ALL) 01/21/2012  . Endometriosis 01/21/2012  . Class 3 severe obesity without serious comorbidity with body mass index (BMI) of 50.0 to 59.9 in adult ([place]) 11/28/2011  . Multinodular goiter (nontoxic), dominant nodule left lobe 07/18/2011   Past Medical History:  Diagnosis Date  . Abdominal pain    INTERMITANT  . Anemia   . Asthma   . GERD (gastroesophageal reflux disease)   . H/O hiatal hernia   . Hemorrhoids   . History of transfusion   . IBS (irritable bowel syndrome)   . Leukemia ([place])    as a child, acute lymphatic with T cell  . Plantar fasciitis   . Plantar fasciitis of left foot FALL 2012   HAD SURGERY TO  FIX IT  . Prediabetes   . Rectal pain   . 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 Thyroid nodule     Family History  Problem Relation Age of Onset  . Cancer Mother 98       LUNG  . Diabetes Mother   . Diabetes Father   . Stroke Father   . Heart disease Father   . Vision loss Father        GLAUCOMA  . Cancer Maternal Aunt        breast  . Breast cancer Maternal Aunt   . Diverticulosis Other   . Colon cancer Neg Hx     Past Surgical History:  Procedure Laterality Date  . ABDOMINAL HYSTERECTOMY  2003   BSO  . BIOPSY  04/14/2020   Procedure: BIOPSY;  Surgeon: Ronald Lobo, MD;  Location: [facility];  Service: Endoscopy;;  . BIOPSY THYROID  11/2009   benign  . carpel tunnel  2004   rt hand  . COLONOSCOPY WITH PROPOFOL N/A  11/08/2016   Procedure: COLONOSCOPY WITH PROPOFOL;  Surgeon: Ronald Lobo, MD;  Location: [facility];  Service: Endoscopy;  Laterality: N/A;  . COLONOSCOPY WITH PROPOFOL N/A 04/14/2020   Procedure: COLONOSCOPY WITH PROPOFOL;  Surgeon: Ronald Lobo, MD;  [facility];  Service: Endoscopy;  Laterality: N/A;  . ESOPHAGOGASTRODUODENOSCOPY (EGD) WITH PROPOFOL N/A 04/14/2020   Procedure: ESOPHAGOGASTRODUODENOSCOPY (EGD) WITH PROPOFOL;  Surgeon: Ronald Lobo, MD;  [facility];  Service: Endoscopy;  Laterality: N/A;  . eye biospy  1979   dx with leukemia from this biospy  . eye biospy  1979   rt eye, dx: leukemia  . EYE SURGERY  1982   rt eye  . NASAL SINUS SURGERY  1999  . PLANTAR FASCIA SURGERY  2004, 2012   rt foot, left foot  . POLYPECTOMY  04/14/2020   Procedure: POLYPECTOMY;  Surgeon: Ronald Lobo, MD;  [facility];  Service: Endoscopy;;  . RADICAL HYSTERECTOMY    . SCLEROTHERAPY  04/14/2020   Procedure: Clide Deutscher;  Surgeon: Ronald Lobo, MD;  [facility];  Service: Endoscopy;;  . skin cancer removal on shoulder  2/14  . THYROID LOBECTOMY Left 09/25/2013   Procedure: LEFT THYROID LOBECTOMY;  Surgeon: Earnstine Regal, MD;  Location: [facility];  Service: General;  Laterality: Left;   Social History   Occupational History  . Not on file  Tobacco Use  . Smoking status: Former Smoker    Quit date: 09/15/1991    Years since quitting: 28.8  . Smokeless tobacco: Never Used  Vaping Use  . Vaping Use: Never used  Substance and Sexual Activity  . Alcohol use: Yes    Alcohol/week: 0.0 standard drinks    Comment: rarely  . Drug use: No  . Sexual activity: Never    Partners: Male    Birth control/protection: Surgical    Comment: hyst.....Marland KitchenINTERCOURSE AGE 33, SEXUAL PARTNERS LESS THAN 5

## 2020-07-06 NOTE — Telephone Encounter (Signed)
Patient called in to set up follow up appt with hilts , she forgot to come to check out desk , didn't see any notes from dr hilts , she said she was advised to return but didn't know when.

## 2020-07-07 ENCOUNTER — Telehealth: Payer: Self-pay

## 2020-07-07 NOTE — Telephone Encounter (Signed)
Called and given this message from Dr. Alvy Bimler. She saw another MD today who called in B12 injection for her to do at home. Please call her later and if she is comfortable doing it and insurance will pay for it we can cancel her injection appt here.  She verbalized understanding. She is comfortable giving the injection and has already picked up the Rx. Injection appts canceled. Instructed to call the office for questions/concerns.

## 2020-07-26 ENCOUNTER — Other Ambulatory Visit: Payer: Self-pay

## 2020-07-26 ENCOUNTER — Ambulatory Visit: Payer: Medicare HMO | Admitting: Physical Therapy

## 2020-07-26 ENCOUNTER — Encounter: Payer: Self-pay | Admitting: Physical Therapy

## 2020-07-26 DIAGNOSIS — M25551 Pain in right hip: Secondary | ICD-10-CM

## 2020-07-26 DIAGNOSIS — R2689 Other abnormalities of gait and mobility: Secondary | ICD-10-CM

## 2020-07-26 DIAGNOSIS — M545 Low back pain, unspecified: Secondary | ICD-10-CM | POA: Diagnosis not present

## 2020-07-26 DIAGNOSIS — M6281 Muscle weakness (generalized): Secondary | ICD-10-CM

## 2020-07-26 DIAGNOSIS — G8929 Other chronic pain: Secondary | ICD-10-CM

## 2020-07-26 NOTE — Therapy (Signed)
[facility] Physical Therapy [address] [place], [place], [address] Phone: [phone]   Fax:  [phone]  Physical Therapy Evaluation  Patient Details  Name: Marissa [name] MRN: [id number] Date of Birth: 11-25-1965 Referring Provider (PT): Eunice Blase, MD   Encounter Date: 07/26/2020   PT End of Session - 07/26/20 1447    Visit Number 1    Number of Visits 12    Date for PT Re-Evaluation 09/20/20    PT Start Time 9518    PT Stop Time 1431    PT Time Calculation (min) 44 min    Activity Tolerance Patient tolerated treatment well    Behavior During Therapy [facility] for tasks assessed/performed           Past Medical History:  Diagnosis Date  . Abdominal pain    INTERMITANT  . Anemia   . Asthma   . GERD (gastroesophageal reflux disease)   . H/O hiatal hernia   . Hemorrhoids   . History of transfusion   . IBS (irritable bowel syndrome)   . Leukemia ([place])    as a child, acute lymphatic with T cell  . Plantar fasciitis   . Plantar fasciitis of left foot FALL 2012   HAD SURGERY TO FIX IT  . Prediabetes   . Rectal pain   . 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 Thyroid nodule     Past Surgical History:  Procedure Laterality Date  . ABDOMINAL HYSTERECTOMY  2003   BSO  . BIOPSY  04/14/2020   Procedure: BIOPSY;  Surgeon: Ronald Lobo, MD;  [facility];  Service: Endoscopy;;  . BIOPSY THYROID  11/2009   benign  . carpel tunnel  2004   rt hand  . COLONOSCOPY WITH PROPOFOL N/A 11/08/2016   Procedure: COLONOSCOPY WITH PROPOFOL;  Surgeon: Ronald Lobo, MD;  [facility];  Service: Endoscopy;  Laterality: N/A;  . COLONOSCOPY WITH PROPOFOL N/A 04/14/2020   Procedure: COLONOSCOPY WITH PROPOFOL;  Surgeon: Ronald Lobo, MD;  [facility];  Service: Endoscopy;  Laterality: N/A;  . ESOPHAGOGASTRODUODENOSCOPY (EGD) WITH PROPOFOL N/A 04/14/2020   Procedure: ESOPHAGOGASTRODUODENOSCOPY (EGD) WITH PROPOFOL;  Surgeon:  Ronald Lobo, MD;  [facility];  Service: Endoscopy;  Laterality: N/A;  . eye biospy  1979   dx with leukemia from this biospy  . eye biospy  1979   rt eye, dx: leukemia  . EYE SURGERY  1982   rt eye  . NASAL SINUS SURGERY  1999  . PLANTAR FASCIA SURGERY  2004, 2012   rt foot, left foot  . POLYPECTOMY  04/14/2020   Procedure: POLYPECTOMY;  Surgeon: Ronald Lobo, MD;  [facility];  Service: Endoscopy;;  . RADICAL HYSTERECTOMY    . SCLEROTHERAPY  04/14/2020   Procedure: Clide Deutscher;  Surgeon: Ronald Lobo, MD;  [facility];  Service: Endoscopy;;  . skin cancer removal on shoulder  2/14  . THYROID LOBECTOMY Left 09/25/2013   Procedure: LEFT THYROID LOBECTOMY;  Surgeon: Earnstine Regal, MD;  Location: [facility];  Service: General;  Laterality: Left;    There were no vitals filed for this visit.    Subjective Assessment - 07/26/20 1350    Subjective She relays back pain and bilat hip pain since 2017. Denies radicular symptoms. She has become less active due to pain    Pertinent History PMH: DM, urinary incontinence, obesity    Limitations Lifting;Standing;Sitting;House hold activities    How long can you sit comfortably? 10  min    How long can you stand comfortably? 2 minutes    Patient Stated Goals reduce pain    Currently in Pain? Yes    Pain Score 7     Pain Location Back   and hips/knees   Pain Orientation Lower    Pain Descriptors / Indicators Aching    Pain Type Chronic pain    Pain Radiating Towards denies radicular symptoms    Pain Frequency Constant    Aggravating Factors  standing up straight, sitting too long, trying to lay flat of her back    Pain Relieving Factors pain meds, heat, sleeping on side              [facility] PT Assessment - 07/26/20 0001      Assessment   Medical Diagnosis Chronic low back pain    Referring Provider (PT) Hilts, Legrand Como, MD    Onset Date/Surgical Date --   back pain since 2017   Next MD Visit PRN      Prior Therapy nothing recent      Precautions   Precautions None      Restrictions   Weight Bearing Restrictions No      Balance Screen   Has the patient fallen in the past 6 months No      [place] residence    Additional Comments 2 steps to enter, has to have railing and be cafeful      Prior Function   Level of Independence Independent    Vocation On disability    Leisure craft festivals      Cognition   Overall Cognitive Status Within Functional Limits for tasks assessed      Observation/Other Assessments   Focus on Therapeutic Outcomes (FOTO)  functional intake 21%, predicted 40%      Posture/Postural Control   Posture Comments swayback posture      ROM / Strength   AROM / PROM / Strength AROM;Strength      AROM   AROM Assessment Site Lumbar    Lumbar Flexion 75%    Lumbar Extension WNL    Lumbar - Right Side Bend 50%    Lumbar - Left Side Bend 50%    Lumbar - Right Rotation 50%    Lumbar - Left Rotation 50%      Strength   Overall Strength Comments hip strength overall 4+, knee strength overall 5/5 tested in sitting      Palpation   Palpation comment TTP in lumbar paraspinals and glutes, denies TTP in lumbar spine      Special Tests   Other special tests +slump test on Rt that was neg on left. negative SLR and negative quadrant testing.. Pain with long axis distraction      Ambulation/Gait   Gait Comments slow gait, wide BOS, decreased step length, decreased hip/knee flexion      Standardized Balance Assessment   Standardized Balance Assessment Five Times Sit to Stand    Five times sit to stand comments  29.67   using UE to push up from standard chair                     Objective measurements completed on examination: See above findings.       [facility] Adult PT Treatment/Exercise - 07/26/20 0001      Modalities   Modalities Electrical Stimulation;Moist Heat      Moist Heat Therapy   Number  Minutes Moist  Heat 15 Minutes    Moist Heat Location Lumbar Spine      Electrical Stimulation   Electrical Stimulation Location lumbar    Electrical Stimulation Action IFC    Electrical Stimulation Parameters tolerance 15 min with heat    Electrical Stimulation Goals Pain                  PT Education - 07/26/20 1447    Education Details HEP, POC    Person(s) Educated Patient    Methods Explanation;Demonstration;Verbal cues;Handout    Comprehension Verbalized understanding;Need further instruction            PT Short Term Goals - 07/26/20 1513      PT SHORT TERM GOAL #1   Title Pt will be I and compliant with HEP.    Time 4    Period Weeks    Status New    Target Date 08/23/20             PT Long Term Goals - 07/26/20 1514      PT LONG TERM GOAL #1   Title Pt will improve FOTO to 40% functional score    Baseline 21%    Time 8    Period Weeks    Status New      PT LONG TERM GOAL #2   Title Pt will improve lumbar ROM to [facility].    Time 8    Period Weeks    Status New      PT LONG TERM GOAL #3   Title Pt will reduce overall pain to less than 5/10 with standing activity at least 25 minutes.    Time 8    Period Weeks    Status New      PT LONG TERM GOAL #4   Title Pt will improve 5TSTS test to less than 25 sec to show improved leg strength and endurance.    Baseline 29 sec    Time 8    Period Weeks    Status New                  Plan - 07/26/20 1517    Clinical Impression Statement Pt presents with chronic LBP and pain in her Rt hip. This presents more muscular and postural in nature as special testing mostly negative and pain with palpation was more in the paraspinals and glutes more than in lumbar spine. She will benefit from skilled PT to address her functional deficits in lumbar ROM, leg stength, posture, activity tolerance, and pain.    Personal Factors and Comorbidities Comorbidity 2;Comorbidity 3+    Comorbidities PMH: DM, urinary  incontinence, obesity    Examination-Activity Limitations Bed Mobility;Bend;Carry;Lift;Stand;Squat;Stairs;Sit;Locomotion Level    Examination-Participation Restrictions Cleaning;Community Activity;Driving;Laundry;Shop    Stability/Clinical Decision Making Evolving/Moderate complexity    Clinical Decision Making Moderate    Rehab Potential Good    PT Frequency 2x / week    PT Duration 8 weeks    PT Treatment/Interventions ADLs/Self Care Home Management;Cryotherapy;Electrical Stimulation;Iontophoresis 4mg /ml Dexamethasone;Moist Heat;Traction;Ultrasound;Therapeutic activities;Therapeutic exercise;Neuromuscular re-education;Manual techniques;Passive range of motion;Dry needling;Joint Manipulations;Spinal Manipulations;Taping    PT Next Visit Plan review and update HEP PRN, needs gentle exercise progression    PT Home Exercise Plan Access Code: [email or web address]    Consulted and Agree with Plan of Care Patient           Patient will benefit from skilled therapeutic intervention in order to improve the following deficits and impairments:  Decreased activity tolerance, Cardiopulmonary status limiting  activity, Decreased endurance, Decreased strength, Decreased range of motion, Difficulty walking, Impaired flexibility, Increased muscle spasms, Increased fascial restricitons, Obesity, Pain, Improper body mechanics, Postural dysfunction  Visit Diagnosis: Chronic bilateral low back pain without sciatica  Other abnormalities of gait and mobility  Muscle weakness (generalized)  Pain in right hip     Problem List Patient Active Problem List   Diagnosis Date Noted  . Vitamin B12 deficiency 04/01/2020  . Iron deficiency anemia 01/15/2020  . Deficiency anemia 01/15/2020  . Thrombocytopenia ([place]) 01/15/2020  . Diabetes ([place]) 09/07/2019  . Osteopenia of forearm 09/07/2019  . Gastroesophageal reflux disease 05/08/2019  . Hyperglycemia 05/08/2019  . Urinary incontinence 05/08/2019  . History of colon  polyps 05/08/2019  . Mild intermittent asthma without complication 76/22/6333  . BV (bacterial vaginosis) 01/01/2013  . Anal fissure 07/07/2012  . History of acute lymphoblastic leukemia (ALL) 01/21/2012  . Endometriosis 01/21/2012  . Class 3 severe obesity without serious comorbidity with body mass index (BMI) of 50.0 to 59.9 in adult ([place]) 11/28/2011  . Multinodular goiter (nontoxic), dominant nodule left lobe 07/18/2011    Silvestre Mesi 07/26/2020, 3:32 PM  [facility] Physical Therapy [address] [place], [place], [address] Phone: [phone]   Fax:  [phone]  Name: Marissa [name] MRN: [id number] Date of Birth: [birth year]

## 2020-07-26 NOTE — Patient Instructions (Signed)
Access Code: [email or web address] URL: [email or web address] Date: 07/26/2020 Prepared by: Elsie Ra  Exercises Seated Scapular Retraction - 2 x daily - 6 x weekly - 2-3 sets - 10 reps - 5 sec hold Seated Diagonal Chop - 2 x daily - 6 x weekly - 1-2 sets - 10 reps seated pelvic clock - 2 x daily - 6 x weekly - 1-2 sets - 10 reps Mini Squat with Counter Support - 2 x daily - 6 x weekly - 1-2 sets - 10 reps

## 2020-08-02 ENCOUNTER — Ambulatory Visit: Payer: Medicare HMO

## 2020-08-04 ENCOUNTER — Ambulatory Visit: Payer: Medicare HMO | Admitting: Physical Therapy

## 2020-08-04 ENCOUNTER — Other Ambulatory Visit: Payer: Self-pay

## 2020-08-04 ENCOUNTER — Encounter: Payer: Self-pay | Admitting: Physical Therapy

## 2020-08-04 DIAGNOSIS — R2689 Other abnormalities of gait and mobility: Secondary | ICD-10-CM

## 2020-08-04 DIAGNOSIS — M6281 Muscle weakness (generalized): Secondary | ICD-10-CM | POA: Diagnosis not present

## 2020-08-04 DIAGNOSIS — M25551 Pain in right hip: Secondary | ICD-10-CM | POA: Diagnosis not present

## 2020-08-04 DIAGNOSIS — M545 Low back pain, unspecified: Secondary | ICD-10-CM

## 2020-08-04 DIAGNOSIS — G8929 Other chronic pain: Secondary | ICD-10-CM

## 2020-08-04 NOTE — Therapy (Signed)
[facility] [address] [place], [place], [address] Phone: [phone]   Fax:  [phone]  Physical Therapy Treatment  Patient Details  Name: Marissa [name] MRN: [id number] Date of Birth: [birth year] Referring Provider (PT): Eunice Blase, MD   Encounter Date: 08/04/2020   PT End of Session - 08/04/20 1213    Visit Number 2    Number of Visits 12    Date for PT Re-Evaluation 09/20/20    PT Start Time 0867    PT Stop Time 1224    PT Time Calculation (min) 39 min    Activity Tolerance Patient tolerated treatment well    Behavior During Therapy [facility] for tasks assessed/performed           Past Medical History:  Diagnosis Date  . Abdominal pain    INTERMITANT  . Anemia   . Asthma   . GERD (gastroesophageal reflux disease)   . H/O hiatal hernia   . Hemorrhoids   . History of transfusion   . IBS (irritable bowel syndrome)   . Leukemia ([place])    as a child, acute lymphatic with T cell  . Plantar fasciitis   . Plantar fasciitis of left foot FALL 2012   HAD SURGERY TO FIX IT  . Prediabetes   . Rectal pain   . 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 Thyroid nodule     Past Surgical History:  Procedure Laterality Date  . ABDOMINAL HYSTERECTOMY  2003   BSO  . BIOPSY  04/14/2020   Procedure: BIOPSY;  Surgeon: Ronald Lobo, MD;  Location: [facility];  Service: Endoscopy;;  . BIOPSY THYROID  11/2009   benign  . carpel tunnel  2004   rt hand  . COLONOSCOPY WITH PROPOFOL N/A 11/08/2016   Procedure: COLONOSCOPY WITH PROPOFOL;  Surgeon: Ronald Lobo, MD;  [facility]: [facility];  Service: Endoscopy;  Laterality: N/A;  . COLONOSCOPY WITH PROPOFOL N/A 04/14/2020   Procedure: COLONOSCOPY WITH PROPOFOL;  Surgeon: Ronald Lobo, MD;  [facility];  Service: Endoscopy;  Laterality: N/A;  . ESOPHAGOGASTRODUODENOSCOPY (EGD) WITH PROPOFOL N/A 04/14/2020   Procedure: ESOPHAGOGASTRODUODENOSCOPY (EGD) WITH PROPOFOL;  Surgeon:  Ronald Lobo, MD;  [facility];  Service: Endoscopy;  Laterality: N/A;  . eye biospy  1979   dx with leukemia from this biospy  . eye biospy  1979   rt eye, dx: leukemia  . EYE SURGERY  1982   rt eye  . NASAL SINUS SURGERY  1999  . PLANTAR FASCIA SURGERY  2004, 2012   rt foot, left foot  . POLYPECTOMY  04/14/2020   Procedure: POLYPECTOMY;  Surgeon: Ronald Lobo, MD;  [facility];  Service: Endoscopy;;  . RADICAL HYSTERECTOMY    . SCLEROTHERAPY  04/14/2020   Procedure: Clide Deutscher;  Surgeon: Ronald Lobo, MD;  [facility]: [facility];  Service: Endoscopy;;  . skin cancer removal on shoulder  2/14  . THYROID LOBECTOMY Left 09/25/2013   Procedure: LEFT THYROID LOBECTOMY;  Surgeon: Earnstine Regal, MD;  Location: [facility];  Service: General;  Laterality: Left;    There were no vitals filed for this visit.   Subjective Assessment - 08/04/20 1149    Subjective back feels better today than last visit.    Pertinent History PMH: DM, urinary incontinence, obesity    Limitations Lifting;Standing;Sitting;House hold activities    How long can you sit comfortably? 10 min    How long can you stand comfortably? 2 minutes  Patient Stated Goals reduce pain    Currently in Pain? Yes    Pain Score 5     Pain Location Back    Pain Orientation Lower    Pain Descriptors / Indicators Aching    Pain Type Chronic pain    Pain Onset More than a month ago    Pain Frequency Constant    Aggravating Factors  standing up straight, sitting too long, lying flat on back    Pain Relieving Factors pain meds, heat. sleeping on side                             [facility] Adult PT Treatment/Exercise - 08/04/20 1150      Exercises   Exercises Lumbar      Lumbar Exercises: Seated   Long Arc Quad on Chair Both;10 reps    Other Seated Lumbar Exercises scapular retraction x 10 reps with 5 sec hold; pelvic tilting in circles x 10 reps each direction    Other Seated Lumbar  Exercises diagonal chop x 10 reps bil; 2#; hamstring curls L3 band x10 bil      Moist Heat Therapy   Number Minutes Moist Heat 12 Minutes    Moist Heat Location Lumbar Spine      Electrical Stimulation   Electrical Stimulation Location lumbar    Electrical Stimulation Action IFC    Electrical Stimulation Parameters to tolerance x 12 min    Electrical Stimulation Goals Pain                    PT Short Term Goals - 08/04/20 1213      PT SHORT TERM GOAL #1   Title Pt will be I and compliant with HEP.    Time 4    Period Weeks    Status Achieved    Target Date 08/23/20             PT Long Term Goals - 08/04/20 1214      PT LONG TERM GOAL #1   Title Pt will improve FOTO to 40% functional score    Baseline 21%    Time 8    Period Weeks    Status On-going      PT LONG TERM GOAL #2   Title Pt will improve lumbar ROM to [facility].    Time 8    Period Weeks    Status On-going      PT LONG TERM GOAL #3   Title Pt will reduce overall pain to less than 5/10 with standing activity at least 25 minutes.    Time 8    Period Weeks    Status On-going      PT LONG TERM GOAL #4   Title Pt will improve 5TSTS test to less than 25 sec to show improved leg strength and endurance.    Baseline 29 sec    Time 8    Period Weeks    Status On-going                 Plan - 08/04/20 1214    Clinical Impression Statement Pt reports increased pain with light exercise at this time, so utilized modalities at end of session as this was helpful last visit.  She's met her STG and demonstrated independence with HEP.  Will continue to benefit from PT to maximize function.    Personal Factors and Comorbidities Comorbidity 2;Comorbidity 3+  Comorbidities PMH: DM, urinary incontinence, obesity    Examination-Activity Limitations Bed Mobility;Bend;Carry;Lift;Stand;Squat;Stairs;Sit;Locomotion Level    Examination-Participation Restrictions Cleaning;Community Activity;Driving;Laundry;Shop     Stability/Clinical Decision Making Evolving/Moderate complexity    Rehab Potential Good    PT Frequency 2x / week    PT Duration 8 weeks    PT Treatment/Interventions ADLs/Self Care Home Management;Cryotherapy;Electrical Stimulation;Iontophoresis 67m/ml Dexamethasone;Moist Heat;Traction;Ultrasound;Therapeutic activities;Therapeutic exercise;Neuromuscular re-education;Manual techniques;Passive range of motion;Dry needling;Joint Manipulations;Spinal Manipulations;Taping    PT Next Visit Plan needs gentle exercise progression; seated/standing strengthening exercises    PT Home Exercise Plan Access Code: [email or web address]   Consulted and Agree with Plan of Care Patient           Patient will benefit from skilled therapeutic intervention in order to improve the following deficits and impairments:  Decreased activity tolerance, Cardiopulmonary status limiting activity, Decreased endurance, Decreased strength, Decreased range of motion, Difficulty walking, Impaired flexibility, Increased muscle spasms, Increased fascial restricitons, Obesity, Pain, Improper body mechanics, Postural dysfunction  Visit Diagnosis: Chronic bilateral low back pain without sciatica  Other abnormalities of gait and mobility  Muscle weakness (generalized)  Pain in right hip     Problem List Patient Active Problem List   Diagnosis Date Noted  . Vitamin B12 deficiency 04/01/2020  . Iron deficiency anemia 01/15/2020  . Deficiency anemia 01/15/2020  . [place]ocyt[place] ([place] 01/15/2020  . Diabetes ([place] 09/07/2019  . Osteopenia of forearm 09/07/2019  . Gastroesophageal reflux disease 05/08/2019  . Hyperglycemia 05/08/2019  . Urinary incontinence 05/08/2019  . History of colon polyps 05/08/2019  . Mild intermittent asthma without complication 066/01/6371 . BV (bacterial vaginosis) 01/01/2013  . Anal fissure 07/07/2012  . History of acute lymphoblastic leukemia (ALL) 01/21/2012  . Endometriosis 01/21/2012  . Class  3 severe obesity without serious comorbidity with body mass index (BMI) of 50.0 to 59.9 in adult ([place] 11/28/2011  . Multinodular goiter (nontoxic), dominant nodule left lobe 07/18/2011      SLaureen Abrahams PT, DPT 08/04/20 12:16 PM    [facility] [address] [address]Phone: [phone]  Fax:  [phone] Name: Marissa STANDLEEMRN: [id number] of Birth: 102-Jul-1967

## 2020-08-08 ENCOUNTER — Encounter: Payer: Self-pay | Admitting: Physical Therapy

## 2020-08-08 ENCOUNTER — Ambulatory Visit: Payer: Medicare HMO | Admitting: Physical Therapy

## 2020-08-08 ENCOUNTER — Other Ambulatory Visit: Payer: Self-pay

## 2020-08-08 DIAGNOSIS — R2689 Other abnormalities of gait and mobility: Secondary | ICD-10-CM

## 2020-08-08 DIAGNOSIS — G8929 Other chronic pain: Secondary | ICD-10-CM

## 2020-08-08 DIAGNOSIS — M545 Low back pain, unspecified: Secondary | ICD-10-CM | POA: Diagnosis not present

## 2020-08-08 DIAGNOSIS — M25551 Pain in right hip: Secondary | ICD-10-CM

## 2020-08-08 DIAGNOSIS — M6281 Muscle weakness (generalized): Secondary | ICD-10-CM

## 2020-08-08 NOTE — Therapy (Signed)
[facility] [address] [place], [place], [address] Phone: [phone]   Fax:  [phone]  Physical Therapy Treatment  Patient Details  Name: Marissa [name] MRN: [id number] Date of Birth: 12-Jun-1966 Referring Provider (PT): Eunice Blase, MD   Encounter Date: 08/08/2020   PT End of Session - 08/08/20 1448    Visit Number 3    Number of Visits 12    Date for PT Re-Evaluation 09/20/20    PT Start Time 0867    PT Stop Time 1438    PT Time Calculation (min) 51 min    Activity Tolerance Patient tolerated treatment well    Behavior During Therapy [facility] for tasks assessed/performed           Past Medical History:  Diagnosis Date  . Abdominal pain    INTERMITANT  . Anemia   . Asthma   . GERD (gastroesophageal reflux disease)   . H/O hiatal hernia   . Hemorrhoids   . History of transfusion   . IBS (irritable bowel syndrome)   . Leukemia ([place])    as a child, acute lymphatic with T cell  . Plantar fasciitis   . Plantar fasciitis of left foot FALL 2012   HAD SURGERY TO FIX IT  . Prediabetes   . Rectal pain   . 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 Thyroid nodule     Past Surgical History:  Procedure Laterality Date  . ABDOMINAL HYSTERECTOMY  2003   BSO  . BIOPSY  04/14/2020   Procedure: BIOPSY;  Surgeon: Ronald Lobo, MD;  [facility];  Service: Endoscopy;;  . BIOPSY THYROID  11/2009   benign  . carpel tunnel  2004   rt hand  . COLONOSCOPY WITH PROPOFOL N/A 11/08/2016   Procedure: COLONOSCOPY WITH PROPOFOL;  Surgeon: Ronald Lobo, MD;  [facility];  Service: Endoscopy;  Laterality: N/A;  . COLONOSCOPY WITH PROPOFOL N/A 04/14/2020   Procedure: COLONOSCOPY WITH PROPOFOL;  Surgeon: Ronald Lobo, MD;  [facility];  Service: Endoscopy;  Laterality: N/A;  . ESOPHAGOGASTRODUODENOSCOPY (EGD) WITH PROPOFOL N/A 04/14/2020   Procedure: ESOPHAGOGASTRODUODENOSCOPY (EGD) WITH PROPOFOL;  Surgeon:  Ronald Lobo, MD;  [facility];  Service: Endoscopy;  Laterality: N/A;  . eye biospy  1979   dx with leukemia from this biospy  . eye biospy  1979   rt eye, dx: leukemia  . EYE SURGERY  1982   rt eye  . NASAL SINUS SURGERY  1999  . PLANTAR FASCIA SURGERY  2004, 2012   rt foot, left foot  . POLYPECTOMY  04/14/2020   Procedure: POLYPECTOMY;  Surgeon: Ronald Lobo, MD;  [facility];  Service: Endoscopy;;  . RADICAL HYSTERECTOMY    . SCLEROTHERAPY  04/14/2020   Procedure: Clide Deutscher;  Surgeon: Ronald Lobo, MD;  [facility];  Service: Endoscopy;;  . skin cancer removal on shoulder  2/14  . THYROID LOBECTOMY Left 09/25/2013   Procedure: LEFT THYROID LOBECTOMY;  Surgeon: Earnstine Regal, MD;  [facility];  Service: General;  Laterality: Left;    There were no vitals filed for this visit.   Subjective Assessment - 08/08/20 1350    Subjective "cold is not my friend."    Pertinent History PMH: DM, urinary incontinence, obesity    Limitations Lifting;Standing;Sitting;House hold activities    How long can you sit comfortably? 10 min    How long can you stand comfortably? 2 minutes    Patient Stated  Goals reduce pain    Currently in Pain? Yes    Pain Score 7     Pain Location Back    Pain Orientation Lower    Pain Descriptors / Indicators Aching    Pain Onset More than a month ago    Pain Frequency Constant    Aggravating Factors  standing up straight, sitting too long, lying flat on back    Pain Relieving Factors pain meds, heat, sleeping on side                             [facility] Adult PT Treatment/Exercise - 08/08/20 1350      Lumbar Exercises: Stretches   Passive Hamstring Stretch Right;Left;3 reps;30 seconds    Passive Hamstring Stretch Limitations seated    Pelvic Tilt 10 reps;5 seconds    Pelvic Tilt Limitations seated      Lumbar Exercises: Seated   Long Arc Quad on Chair Both;10 reps    LAQ on Chair Weights  (lbs) 2    Hip Flexion on Ball Limitations x 10 reps bil; 2# seated in chair    Sit to Stand 10 reps   without UE support   Other Seated Lumbar Exercises trunk rotation with 2# med ball x 10 reps bil; scapular retraction L3 band 2x10; bil shoulder ER L3 band 2x10    Other Seated Lumbar Exercises seated overhead reach 2# med ball, 10x5 sec hold; diagonal chop with 2# med ball x 10 bil      Moist Heat Therapy   Number Minutes Moist Heat 12 Minutes    Moist Heat Location Lumbar Spine      Electrical Stimulation   Electrical Stimulation Location lumbar    Electrical Stimulation Action IFC    Electrical Stimulation Parameters to tolerance x 12 min    Electrical Stimulation Goals Pain                    PT Short Term Goals - 08/04/20 1213      PT SHORT TERM GOAL #1   Title Pt will be I and compliant with HEP.    Time 4    Period Weeks    Status Achieved    Target Date 08/23/20             PT Long Term Goals - 08/04/20 1214      PT LONG TERM GOAL #1   Title Pt will improve FOTO to 40% functional score    Baseline 21%    Time 8    Period Weeks    Status On-going      PT LONG TERM GOAL #2   Title Pt will improve lumbar ROM to [facility].    Time 8    Period Weeks    Status On-going      PT LONG TERM GOAL #3   Title Pt will reduce overall pain to less than 5/10 with standing activity at least 25 minutes.    Time 8    Period Weeks    Status On-going      PT LONG TERM GOAL #4   Title Pt will improve 5TSTS test to less than 25 sec to show improved leg strength and endurance.    Baseline 29 sec    Time 8    Period Weeks    Status On-going                 Plan -  08/08/20 1448    Clinical Impression Statement Pt tolerated session well today, with limited tolerance to exercises, but overall tolerated well.  Will continue to benefit from PT to maximize function.    Personal Factors and Comorbidities Comorbidity 2;Comorbidity 3+    Comorbidities PMH: DM,  urinary incontinence, obesity    Examination-Activity Limitations Bed Mobility;Bend;Carry;Lift;Stand;Squat;Stairs;Sit;Locomotion Level    Examination-Participation Restrictions Cleaning;Community Activity;Driving;Laundry;Shop    Stability/Clinical Decision Making Evolving/Moderate complexity    Rehab Potential Good    PT Frequency 2x / week    PT Duration 8 weeks    PT Treatment/Interventions ADLs/Self Care Home Management;Cryotherapy;Electrical Stimulation;Iontophoresis 4mg /ml Dexamethasone;Moist Heat;Traction;Ultrasound;Therapeutic activities;Therapeutic exercise;Neuromuscular re-education;Manual techniques;Passive range of motion;Dry needling;Joint Manipulations;Spinal Manipulations;Taping    PT Next Visit Plan needs gentle exercise progression; seated/standing strengthening exercises    PT Home Exercise Plan Access Code: [email or web address]    Consulted and Agree with Plan of Care Patient           Patient will benefit from skilled therapeutic intervention in order to improve the following deficits and impairments:  Decreased activity tolerance, Cardiopulmonary status limiting activity, Decreased endurance, Decreased strength, Decreased range of motion, Difficulty walking, Impaired flexibility, Increased muscle spasms, Increased fascial restricitons, Obesity, Pain, Improper body mechanics, Postural dysfunction  Visit Diagnosis: Chronic bilateral low back pain without sciatica  Other abnormalities of gait and mobility  Muscle weakness (generalized)  Pain in right hip     Problem List Patient Active Problem List   Diagnosis Date Noted  . Vitamin B12 deficiency 04/01/2020  . Iron deficiency anemia 01/15/2020  . Deficiency anemia 01/15/2020  . Thrombocytopenia ([place]) 01/15/2020  . Diabetes ([place]) 09/07/2019  . Osteopenia of forearm 09/07/2019  . Gastroesophageal reflux disease 05/08/2019  . Hyperglycemia 05/08/2019  . Urinary incontinence 05/08/2019  . History of colon polyps 05/08/2019    . Mild intermittent asthma without complication 53/20/2334  . BV (bacterial vaginosis) 01/01/2013  . Anal fissure 07/07/2012  . History of acute lymphoblastic leukemia (ALL) 01/21/2012  . Endometriosis 01/21/2012  . Class 3 severe obesity without serious comorbidity with body mass index (BMI) of 50.0 to 59.9 in adult ([place]) 11/28/2011  . Multinodular goiter (nontoxic), dominant nodule left lobe 07/18/2011      Laureen Abrahams, PT, DPT 08/08/20 2:50 PM     [facility] [address] [place], [place], [address] Phone: [phone]   Fax:  [phone]  Name: DEKLYNN CHARLET MRN: [id number] Date of Birth: 16-Jul-1966

## 2020-08-09 ENCOUNTER — Telehealth: Payer: Self-pay

## 2020-08-09 NOTE — Telephone Encounter (Signed)
Patient called in wanting to get her syringes for her b12 but all the pharmacies are on back order wanted to know if she can pick them up here.

## 2020-08-09 NOTE — Telephone Encounter (Signed)
I called the patient: she is needing 25G 5/8" needles on the syringes. We do not have that needle size here (all of ours are 1.5" long). I advised her to check with one of the hospital outpatient pharmacies ([facility] is her closest).

## 2020-08-16 ENCOUNTER — Ambulatory Visit: Payer: Medicare HMO | Admitting: Physical Therapy

## 2020-08-16 ENCOUNTER — Other Ambulatory Visit: Payer: Self-pay

## 2020-08-16 ENCOUNTER — Encounter: Payer: Self-pay | Admitting: Physical Therapy

## 2020-08-16 DIAGNOSIS — M545 Low back pain, unspecified: Secondary | ICD-10-CM | POA: Diagnosis not present

## 2020-08-16 DIAGNOSIS — R2689 Other abnormalities of gait and mobility: Secondary | ICD-10-CM | POA: Diagnosis not present

## 2020-08-16 DIAGNOSIS — M25551 Pain in right hip: Secondary | ICD-10-CM

## 2020-08-16 DIAGNOSIS — M6281 Muscle weakness (generalized): Secondary | ICD-10-CM

## 2020-08-16 DIAGNOSIS — G8929 Other chronic pain: Secondary | ICD-10-CM

## 2020-08-16 NOTE — Therapy (Addendum)
[facility] Physical Therapy [address] [place], [place], [address] Phone: [phone]   Fax:  [phone]  Physical Therapy Treatment/Discharge Summary  Patient Details  Name: Marissa [name] MRN: [id number] Date of Birth: January 06, 1966 Referring Provider (PT): Eunice Blase, MD   Encounter Date: 08/16/2020   PT End of Session - 08/16/20 1046    Visit Number 4    Number of Visits 12    Date for PT Re-Evaluation 09/20/20    PT Start Time 1015    Activity Tolerance Patient tolerated treatment well    Behavior During Therapy [facility] for tasks assessed/performed           Past Medical History:  Diagnosis Date  . Abdominal pain    INTERMITANT  . Anemia   . Asthma   . GERD (gastroesophageal reflux disease)   . H/O hiatal hernia   . Hemorrhoids   . History of transfusion   . IBS (irritable bowel syndrome)   . Leukemia ([place])    as a child, acute lymphatic with T cell  . Plantar fasciitis   . Plantar fasciitis of left foot FALL 2012   HAD SURGERY TO FIX IT  . Prediabetes   . Rectal pain   . 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 Thyroid nodule     Past Surgical History:  Procedure Laterality Date  . ABDOMINAL HYSTERECTOMY  2003   BSO  . BIOPSY  04/14/2020   Procedure: BIOPSY;  Surgeon: Ronald Lobo, MD;  [facility];  Service: Endoscopy;;  . BIOPSY THYROID  11/2009   benign  . carpel tunnel  2004   rt hand  . COLONOSCOPY WITH PROPOFOL N/A 11/08/2016   Procedure: COLONOSCOPY WITH PROPOFOL;  Surgeon: Ronald Lobo, MD;  [facility];  Service: Endoscopy;  Laterality: N/A;  . COLONOSCOPY WITH PROPOFOL N/A 04/14/2020   Procedure: COLONOSCOPY WITH PROPOFOL;  Surgeon: Ronald Lobo, MD;  [facility];  Service: Endoscopy;  Laterality: N/A;  . ESOPHAGOGASTRODUODENOSCOPY (EGD) WITH PROPOFOL N/A 04/14/2020   Procedure: ESOPHAGOGASTRODUODENOSCOPY (EGD) WITH PROPOFOL;  Surgeon: Ronald Lobo, MD;  [facility];  Service: Endoscopy;  Laterality: N/A;  . eye biospy  1979   dx with leukemia from this biospy  . eye biospy  1979   rt eye, dx: leukemia  . EYE SURGERY  1982   rt eye  . NASAL SINUS SURGERY  1999  . PLANTAR FASCIA SURGERY  2004, 2012   rt foot, left foot  . POLYPECTOMY  04/14/2020   Procedure: POLYPECTOMY;  Surgeon: Ronald Lobo, MD;  [facility];  Service: Endoscopy;;  . RADICAL HYSTERECTOMY    . SCLEROTHERAPY  04/14/2020   Procedure: Clide Deutscher;  Surgeon: Ronald Lobo, MD;  [facility];  Service: Endoscopy;;  . skin cancer removal on shoulder  2/14  . THYROID LOBECTOMY Left 09/25/2013   Procedure: LEFT THYROID LOBECTOMY;  Surgeon: Earnstine Regal, MD;  [facility];  Service: General;  Laterality: Left;    There were no vitals filed for this visit.   Subjective Assessment - 08/16/20 1013    Subjective Rt hip and knee have flared up - aggravated her all weekend.    Pertinent History PMH: DM, urinary incontinence, obesity    Limitations Lifting;Standing;Sitting;House hold activities    How long can you sit comfortably? 10 min    How long can you stand comfortably? 2 minutes    Patient Stated Goals reduce pain    Currently in  Pain? Yes    Pain Score 4     Pain Orientation Lower    Pain Descriptors / Indicators Aching    Pain Type Chronic pain    Pain Onset More than a month ago    Pain Frequency Constant    Aggravating Factors  standing up straight, sitting too long, lying flat on back    Pain Relieving Factors pain meds, heat, sleeping on side    Multiple Pain Sites Yes    Pain Score 8    Pain Location Hip   and knee   Pain Orientation Right    Pain Descriptors / Indicators Radiating;Sharp    Pain Type Chronic pain    Pain Onset More than a month ago    Pain Frequency Intermittent    Aggravating Factors  standing, walking, pivoting    Pain Relieving Factors medication, heat                             [facility]  Adult PT Treatment/Exercise - 08/16/20 1018      Lumbar Exercises: Stretches   Passive Hamstring Stretch Right;Left;3 reps;30 seconds    Passive Hamstring Stretch Limitations seated    Other Lumbar Stretch Exercise seated ant/post pelvic tilt 10 x 10 sec      Lumbar Exercises: Seated   Long Arc Quad on Chair 10 reps;Left;5 reps;Right    LAQ on Chair Weights (lbs) 2    LAQ on Chair Limitations no weight on Rt    Hip Flexion on Ball Limitations x 10 reps bil; 2# on Lt only seated in chair    Other Seated Lumbar Exercises ball squeeze 10 x 5 sec      Moist Heat Therapy   Number Minutes Moist Heat 12 Minutes    Moist Heat Location Lumbar Spine;Knee      Electrical Stimulation   Electrical Stimulation Location lumbar    Electrical Stimulation Action IFC    Electrical Stimulation Parameters to tolerance x 12 min    Electrical Stimulation Goals Pain                    PT Short Term Goals - 08/04/20 1213      PT SHORT TERM GOAL #1   Title Pt will be I and compliant with HEP.    Time 4    Period Weeks    Status Achieved    Target Date 08/23/20             PT Long Term Goals - 08/04/20 1214      PT LONG TERM GOAL #1   Title Pt will improve FOTO to 40% functional score    Baseline 21%    Time 8    Period Weeks    Status On-going      PT LONG TERM GOAL #2   Title Pt will improve lumbar ROM to [facility].    Time 8    Period Weeks    Status On-going      PT LONG TERM GOAL #3   Title Pt will reduce overall pain to less than 5/10 with standing activity at least 25 minutes.    Time 8    Period Weeks    Status On-going      PT LONG TERM GOAL #4   Title Pt will improve 5TSTS test to less than 25 sec to show improved leg strength and endurance.    Baseline 29 sec  Time 8    Period Weeks    Status On-going                 Plan - 08/16/20 1046    Clinical Impression Statement Pt with elevated pain today in Rt hip and knee as well as in back so unable to  participate in many light seated exercises.  Session mostly focued on pain control and activity within tolerance.  All goals ongoing at this time.    Personal Factors and Comorbidities Comorbidity 2;Comorbidity 3+    Comorbidities PMH: DM, urinary incontinence, obesity    Examination-Activity Limitations Bed Mobility;Bend;Carry;Lift;Stand;Squat;Stairs;Sit;Locomotion Level    Examination-Participation Restrictions Cleaning;Community Activity;Driving;Laundry;Shop    Stability/Clinical Decision Making Evolving/Moderate complexity    Rehab Potential Good    PT Frequency 2x / week    PT Duration 8 weeks    PT Treatment/Interventions ADLs/Self Care Home Management;Cryotherapy;Electrical Stimulation;Iontophoresis 68m/ml Dexamethasone;Moist Heat;Traction;Ultrasound;Therapeutic activities;Therapeutic exercise;Neuromuscular re-education;Manual techniques;Passive range of motion;Dry needling;Joint Manipulations;Spinal Manipulations;Taping    PT Next Visit Plan needs gentle exercise progression; seated/standing strengthening exercises    PT Home Exercise Plan Access Code: [email or web address]   Consulted and Agree with Plan of Care Patient           Patient will benefit from skilled therapeutic intervention in order to improve the following deficits and impairments:  Decreased activity tolerance, Cardiopulmonary status limiting activity, Decreased endurance, Decreased strength, Decreased range of motion, Difficulty walking, Impaired flexibility, Increased muscle spasms, Increased fascial restricitons, Obesity, Pain, Improper body mechanics, Postural dysfunction  Visit Diagnosis: Chronic bilateral low back pain without sciatica  Other abnormalities of gait and mobility  Muscle weakness (generalized)  Pain in right hip     Problem List Patient Active Problem List   Diagnosis Date Noted  . Vitamin B12 deficiency 04/01/2020  . Iron deficiency anemia 01/15/2020  . Deficiency anemia 01/15/2020  .  Thrombocytopenia ([place] 01/15/2020  . Diabetes ([place] 09/07/2019  . Osteopenia of forearm 09/07/2019  . Gastroesophageal reflux disease 05/08/2019  . Hyperglycemia 05/08/2019  . Urinary incontinence 05/08/2019  . History of colon polyps 05/08/2019  . Mild intermittent asthma without complication 086/16/8372 . BV (bacterial vaginosis) 01/01/2013  . Anal fissure 07/07/2012  . History of acute lymphoblastic leukemia (ALL) 01/21/2012  . Endometriosis 01/21/2012  . Class 3 severe obesity without serious comorbidity with body mass index (BMI) of 50.0 to 59.9 in adult ([place] 11/28/2011  . Multinodular goiter (nontoxic), dominant nodule left lobe 07/18/2011      SLaureen Abrahams PT, DPT 08/16/20 11:03 AM    [facility] [address] [address]Phone: [phone]  Fax:  [phone] Name: Marissa LINGLEMRN: [id number] of Birth: [birth year]   PHYSICAL THERAPY DISCHARGE SUMMARY  Visits from Start of Care: 4  Current functional level related to goals / functional outcomes: See above   Remaining deficits: See above; pt cx as PT was causing more pain   Education / Equipment: HEP  Plan: Patient agrees to discharge.  Patient goals were not met. Patient is being discharged due to the patient's request.  ?????    SLaureen Abrahams PT, DPT 11/02/20 10:34 AM  [facility] [address] [address]Phone: [phone]  Fax:  [phone]

## 2020-08-24 ENCOUNTER — Encounter: Payer: Medicare HMO | Admitting: Physical Therapy

## 2020-08-26 ENCOUNTER — Ambulatory Visit (INDEPENDENT_AMBULATORY_CARE_PROVIDER_SITE_OTHER): Payer: Medicare HMO | Admitting: Family Medicine

## 2020-08-26 ENCOUNTER — Encounter: Payer: Self-pay | Admitting: Family Medicine

## 2020-08-26 ENCOUNTER — Other Ambulatory Visit: Payer: Self-pay

## 2020-08-26 VITALS — BP 134/76 | HR 102 | Ht 63.0 in | Wt 351.6 lb

## 2020-08-26 DIAGNOSIS — R600 Localized edema: Secondary | ICD-10-CM | POA: Diagnosis not present

## 2020-08-26 DIAGNOSIS — E538 Deficiency of other specified B group vitamins: Secondary | ICD-10-CM

## 2020-08-26 DIAGNOSIS — R011 Cardiac murmur, unspecified: Secondary | ICD-10-CM

## 2020-08-26 NOTE — Progress Notes (Signed)
Office Visit Note   Patient: Marissa [name]           Date of Birth: 1966-09-20           MRN: [id number] Visit Date: 08/26/2020 Requested by: Eunice Blase, MD [place],  [place] [id number] PCP: Eunice Blase, MD  Subjective: Chief Complaint  Patient presents with  . Right Leg - Pain    Swelling from the thighs down to the feet x 3 weeks. She is wondering if the vitamin B12 injections could be contributing.  . Left Leg - Pain    HPI: She is here with bilateral leg swelling.  Symptoms started about 3 weeks ago.  She thinks it corresponds to starting B12 injections every 3 days.  She feels much better on B12, but after reading the side effects she noticed that leg swelling is possibility.  No other changes in her medications.  She denies any chest pain or shortness of breath.  She has a family history of CHF.  She has not received any vaccines or been immobilized recently, no personal history of DVT.                ROS:   All other systems were reviewed and are negative.  Objective: Vital Signs: BP 134/76   Pulse (!) 102   Ht 5\' 3"  (1.6 m)   Wt (!) 351 lb 9.6 oz (159.5 kg)   LMP 07/23/2002   BMI 62.28 kg/m   Physical Exam:  General:  Alert and oriented, in no acute distress. Pulm:  Breathing unlabored. Psy:  Normal mood, congruent affect. Lungs: Clear to auscultation throughout. Heart: She has a systolic murmur today 2/6. Legs: She has bilateral pitting edema to mid thigh.  Bevelyn Buckles' sign is negative.    Imaging: No results found.  Assessment & Plan: 1.  Bilateral leg edema, could be side effect of B12 injections.  Cannot rule out CHF, DVT.  Question new onset heart murmur. -We will order echocardiogram, D-dimer and BNP. -Lower extremity Dopplers if D-dimer is positive. -Cardiac consult if echocardiogram and BNP are abnormal. -If testing is negative, she will stop B12 until swelling improves and then we will cut the dosage to see if she tolerates it at a  lower dose.     Procedures: No procedures performed  No notes on file     PMFS History: Patient Active Problem List   Diagnosis Date Noted  . Vitamin B12 deficiency 04/01/2020  . Iron deficiency anemia 01/15/2020  . Deficiency anemia 01/15/2020  . Thrombocytopenia ([place]) 01/15/2020  . Diabetes ([place]) 09/07/2019  . Osteopenia of forearm 09/07/2019  . Gastroesophageal reflux disease 05/08/2019  . Hyperglycemia 05/08/2019  . Urinary incontinence 05/08/2019  . History of colon polyps 05/08/2019  . Mild intermittent asthma without complication 10/93/2355  . BV (bacterial vaginosis) 01/01/2013  . Anal fissure 07/07/2012  . History of acute lymphoblastic leukemia (ALL) 01/21/2012  . Endometriosis 01/21/2012  . Class 3 severe obesity without serious comorbidity with body mass index (BMI) of 50.0 to 59.9 in adult ([place]) 11/28/2011  . Multinodular goiter (nontoxic), dominant nodule left lobe 07/18/2011   Past Medical History:  Diagnosis Date  . Abdominal pain    INTERMITANT  . Anemia   . Asthma   . GERD (gastroesophageal reflux disease)   . H/O hiatal hernia   . Hemorrhoids   . History of transfusion   . IBS (irritable bowel syndrome)   . Leukemia ([place])    as  a child, acute lymphatic with T cell  . Plantar fasciitis   . Plantar fasciitis of left foot FALL 2012   HAD SURGERY TO FIX IT  . Prediabetes   . Rectal pain   . 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 Thyroid nodule     Family History  Problem Relation Age of Onset  . Cancer Mother 57       LUNG  . Diabetes Mother   . Diabetes Father   . Stroke Father   . Heart disease Father   . Vision loss Father        GLAUCOMA  . Cancer Maternal Aunt        breast  . Breast cancer Maternal Aunt   . Diverticulosis Other   . Colon cancer Neg Hx     Past Surgical History:  Procedure Laterality Date  . ABDOMINAL HYSTERECTOMY  2003   BSO  . BIOPSY  04/14/2020   Procedure: BIOPSY;  Surgeon: Ronald Lobo, MD;   [facility];  Service: Endoscopy;;  . BIOPSY THYROID  11/2009   benign  . carpel tunnel  2004   rt hand  . COLONOSCOPY WITH PROPOFOL N/A 11/08/2016   Procedure: COLONOSCOPY WITH PROPOFOL;  Surgeon: Ronald Lobo, MD;  [facility];  Service: Endoscopy;  Laterality: N/A;  . COLONOSCOPY WITH PROPOFOL N/A 04/14/2020   Procedure: COLONOSCOPY WITH PROPOFOL;  Surgeon: Ronald Lobo, MD;  [facility];  Service: Endoscopy;  Laterality: N/A;  . ESOPHAGOGASTRODUODENOSCOPY (EGD) WITH PROPOFOL N/A 04/14/2020   Procedure: ESOPHAGOGASTRODUODENOSCOPY (EGD) WITH PROPOFOL;  Surgeon: Ronald Lobo, MD;  [facility];  Service: Endoscopy;  Laterality: N/A;  . eye biospy  1979   dx with leukemia from this biospy  . eye biospy  1979   rt eye, dx: leukemia  . EYE SURGERY  1982   rt eye  . NASAL SINUS SURGERY  1999  . PLANTAR FASCIA SURGERY  2004, 2012   rt foot, left foot  . POLYPECTOMY  04/14/2020   Procedure: POLYPECTOMY;  Surgeon: Ronald Lobo, MD;  [facility];  Service: Endoscopy;;  . RADICAL HYSTERECTOMY    . SCLEROTHERAPY  04/14/2020   Procedure: Clide Deutscher;  Surgeon: Ronald Lobo, MD;  [facility];  Service: Endoscopy;;  . skin cancer removal on shoulder  2/14  . THYROID LOBECTOMY Left 09/25/2013   Procedure: LEFT THYROID LOBECTOMY;  Surgeon: Earnstine Regal, MD;  [facility];  Service: General;  Laterality: Left;   Social History   Occupational History  . Not on file  Tobacco Use  . Smoking status: Former Smoker    Quit date: 09/15/1991    Years since quitting: 28.9  . Smokeless tobacco: Never Used  Vaping Use  . Vaping Use: Never used  Substance and Sexual Activity  . Alcohol use: Yes    Alcohol/week: 0.0 standard drinks    Comment: rarely  . Drug use: No  . Sexual activity: Never    Partners: Male    Birth control/protection: Surgical    Comment: hyst.....Marland KitchenINTERCOURSE AGE 13, SEXUAL PARTNERS LESS THAN 5

## 2020-08-27 LAB — BRAIN NATRIURETIC PEPTIDE: Brain Natriuretic Peptide: 53 pg/mL (ref <100)

## 2020-08-27 LAB — D-DIMER, QUANTITATIVE: D-Dimer, Quant: 0.71 mcg/mL FEU — ABNORMAL HIGH (ref <0.50)

## 2020-08-29 ENCOUNTER — Telehealth: Payer: Self-pay | Admitting: *Deleted

## 2020-08-29 ENCOUNTER — Telehealth: Payer: Self-pay | Admitting: Family Medicine

## 2020-08-29 ENCOUNTER — Ambulatory Visit: Payer: Medicare HMO | Admitting: Family Medicine

## 2020-08-29 DIAGNOSIS — R600 Localized edema: Secondary | ICD-10-CM

## 2020-08-29 DIAGNOSIS — R7989 Other specified abnormal findings of blood chemistry: Secondary | ICD-10-CM

## 2020-08-29 NOTE — Telephone Encounter (Signed)
Patient called requesting a call back from [place]. She state to have medical questions about her results. Please call patient at [phone].

## 2020-08-29 NOTE — Telephone Encounter (Signed)
I called the patient. She was inquiring about if a venous doppler has been scheduled for her, since her D-dimer came back positive. I will check with the referral coordinator  -- she or I will call the patient back about this.

## 2020-08-29 NOTE — Telephone Encounter (Signed)
Pt is scheduled tomorrow 08/30/20 at 11am at [facility] for Ultrasound, pt is aware of appt

## 2020-08-29 NOTE — Telephone Encounter (Signed)
D-Dimer is positive.

## 2020-08-30 ENCOUNTER — Encounter: Payer: Medicare HMO | Admitting: Physical Therapy

## 2020-08-30 ENCOUNTER — Telehealth: Payer: Self-pay | Admitting: Family Medicine

## 2020-08-30 ENCOUNTER — Ambulatory Visit (HOSPITAL_COMMUNITY)
Admission: RE | Admit: 2020-08-30 | Discharge: 2020-08-30 | Disposition: A | Discharge status: Home / Self Care | Payer: Medicare HMO | Source: Ambulatory Visit | Attending: Family Medicine | Admitting: Family Medicine

## 2020-08-30 ENCOUNTER — Other Ambulatory Visit: Payer: Self-pay

## 2020-08-30 DIAGNOSIS — R7989 Other specified abnormal findings of blood chemistry: Secondary | ICD-10-CM | POA: Diagnosis present

## 2020-08-30 DIAGNOSIS — R600 Localized edema: Secondary | ICD-10-CM | POA: Insufficient documentation

## 2020-08-30 NOTE — Telephone Encounter (Signed)
No DVT seen.

## 2020-08-30 NOTE — Progress Notes (Signed)
Bilateral lower extremity venous duplex has been completed. Preliminary results can be found in CV Proc through chart review.  Results were faxed to Dr. Junius Roads' office.  08/30/20 11:07 AM Carlos Levering RVT

## 2020-08-31 ENCOUNTER — Telehealth: Payer: Self-pay | Admitting: Family Medicine

## 2020-08-31 NOTE — Telephone Encounter (Signed)
Pt called wanting to know what the next step was since her doppler results came back negative yesterday and she would like a CB to discuss this please  [phone]

## 2020-08-31 NOTE — Telephone Encounter (Signed)
Stop the B12 injections until swelling subsides.  Then she can re-start B12 at half-dose to see if it helps her energy without causing swelling.

## 2020-08-31 NOTE — Telephone Encounter (Signed)
Please advise 

## 2020-08-31 NOTE — Telephone Encounter (Signed)
I called and advised the patient of the plan. She agreed to try this. The swelling is not quite as bad today, but the legs will swell if she sits too long with her legs hanging down -- trying to elevate them when she sits. Advised her to let us know if she continues to have issues after restarting the new regimen.

## 2020-09-01 ENCOUNTER — Ambulatory Visit: Payer: Medicare HMO

## 2020-09-02 ENCOUNTER — Telehealth: Payer: Self-pay | Admitting: Family Medicine

## 2020-09-02 NOTE — Telephone Encounter (Signed)
I called and advised the patient. Scheduled an appointment for her for Monday afternoon with Dr. Junius Roads. I sent a message to the referral coordinator to see if she will check on the echocardiogram that is needing to be scheduled.

## 2020-09-02 NOTE — Telephone Encounter (Signed)
Stay off the B12 for now.   We could bring her in for Dynaflex wraps next week if not improved.

## 2020-09-02 NOTE — Telephone Encounter (Signed)
Patient called requesting a call back from [place]. Patient states she can not take the B-12. Patient states her leg is swollen and tingling. Patient phone number is [phone].

## 2020-09-02 NOTE — Telephone Encounter (Signed)
Please advise 

## 2020-09-05 ENCOUNTER — Encounter: Payer: Self-pay | Admitting: Family Medicine

## 2020-09-05 ENCOUNTER — Other Ambulatory Visit: Payer: Self-pay

## 2020-09-05 ENCOUNTER — Telehealth: Payer: Self-pay | Admitting: *Deleted

## 2020-09-05 ENCOUNTER — Ambulatory Visit (INDEPENDENT_AMBULATORY_CARE_PROVIDER_SITE_OTHER): Payer: Medicare HMO | Admitting: Family Medicine

## 2020-09-05 VITALS — Ht 63.0 in | Wt 351.6 lb

## 2020-09-05 DIAGNOSIS — R600 Localized edema: Secondary | ICD-10-CM

## 2020-09-05 MED ORDER — POTASSIUM CHLORIDE CRYS ER 10 MEQ PO TBCR: EXTENDED_RELEASE_TABLET | ORAL | 1 refills | Status: DC

## 2020-09-05 MED ORDER — FUROSEMIDE 20 MG PO TABS: ORAL_TABLET | ORAL | 1 refills | Status: DC

## 2020-09-05 NOTE — Telephone Encounter (Signed)
I teams message Marissa [name] with [facility] and asked if she could get pt scheduled. Pending apt

## 2020-09-05 NOTE — Progress Notes (Signed)
Office Visit Note   Patient: Marissa [name]           Date of Birth: 1966/01/27           MRN: [id number] Visit Date: 09/05/2020 Requested by: Eunice Blase, MD [place],  [place] [address] PCP: Eunice Blase, MD  Subjective: Chief Complaint  Patient presents with  . Other    Follow up for bilateral leg edema. The swelling is not as bad today - was not on her legs much yesterday nor today.    HPI: She is here for follow-up bilateral leg edema.  Swelling is a little bit better when she is off her feet and her legs are elevated, but when she stands for any length of time her legs swell all the way up.  Denies any shortness of breath or chest pain other than from deconditioning.  Echocardiogram is scheduled for January 4.  Dopplers were negative for DVT.                ROS:   All other systems were reviewed and are negative.  Objective: Vital Signs: Ht 5\' 3"  (1.6 m)   Wt (!) 351 lb 9.6 oz (159.5 kg)   LMP 07/23/2002   BMI 62.28 kg/m   Physical Exam:  General:  Alert and oriented, in no acute distress. Pulm:  Breathing unlabored. Psy:  Normal mood, congruent affect. Skin: She has 2+ pitting edema to above the knees. Lungs: Clear to auscultation throughout with no wheezing or areas of consolidation.    Imaging: No results found.  Assessment & Plan: 1.  Persistent bilateral leg edema, possibly venous insufficiency.  Cannot completely rule out CHF, although BNP was normal. -We discussed graded compression wraps but patient is concerned about her urinary incontinence troubles.  We also discussed getting fitted for compression stockings, but she does not think she will be able to put them on herself or remove them. -She states that since she has incontinence anyway, she would like to try Lasix as needed.     Procedures: No procedures performed        PMFS History: Patient Active Problem List   Diagnosis Date Noted  . Vitamin B12 deficiency  04/01/2020  . Iron deficiency anemia 01/15/2020  . Deficiency anemia 01/15/2020  . Thrombocytopenia ([place]) 01/15/2020  . Diabetes ([place]) 09/07/2019  . Osteopenia of forearm 09/07/2019  . Gastroesophageal reflux disease 05/08/2019  . Hyperglycemia 05/08/2019  . Urinary incontinence 05/08/2019  . History of colon polyps 05/08/2019  . Mild intermittent asthma without complication 92/07/9416  . BV (bacterial vaginosis) 01/01/2013  . Anal fissure 07/07/2012  . History of acute lymphoblastic leukemia (ALL) 01/21/2012  . Endometriosis 01/21/2012  . Class 3 severe obesity without serious comorbidity with body mass index (BMI) of 50.0 to 59.9 in adult ([place]) 11/28/2011  . Multinodular goiter (nontoxic), dominant nodule left lobe 07/18/2011   Past Medical History:  Diagnosis Date  . Abdominal pain    INTERMITANT  . Anemia   . Asthma   . GERD (gastroesophageal reflux disease)   . H/O hiatal hernia   . Hemorrhoids   . History of transfusion   . IBS (irritable bowel syndrome)   . Leukemia ([place])    as a child, acute lymphatic with T cell  . Plantar fasciitis   . Plantar fasciitis of left foot FALL 2012   HAD SURGERY TO FIX IT  . Prediabetes   . Rectal pain   . Skin cancer 2/14  4inch squamous cell  . Thyroid nodule     Family History  Problem Relation Age of Onset  . Cancer Mother 53       LUNG  . Diabetes Mother   . Diabetes Father   . Stroke Father   . Heart disease Father   . Vision loss Father        GLAUCOMA  . Cancer Maternal Aunt        breast  . Breast cancer Maternal Aunt   . Diverticulosis Other   . Colon cancer Neg Hx     Past Surgical History:  Procedure Laterality Date  . ABDOMINAL HYSTERECTOMY  2003   BSO  . BIOPSY  04/14/2020   Procedure: BIOPSY;  Surgeon: Ronald Lobo, MD;  [facility];  Service: Endoscopy;;  . BIOPSY THYROID  11/2009   benign  . carpel tunnel  2004   rt hand  . COLONOSCOPY WITH PROPOFOL N/A 11/08/2016   Procedure:  COLONOSCOPY WITH PROPOFOL;  Surgeon: Ronald Lobo, MD;  [facility];  Service: Endoscopy;  Laterality: N/A;  . COLONOSCOPY WITH PROPOFOL N/A 04/14/2020   Procedure: COLONOSCOPY WITH PROPOFOL;  Surgeon: Ronald Lobo, MD;  [facility];  Service: Endoscopy;  Laterality: N/A;  . ESOPHAGOGASTRODUODENOSCOPY (EGD) WITH PROPOFOL N/A 04/14/2020   Procedure: ESOPHAGOGASTRODUODENOSCOPY (EGD) WITH PROPOFOL;  Surgeon: Ronald Lobo, MD;  [facility];  Service: Endoscopy;  Laterality: N/A;  . eye biospy  1979   dx with leukemia from this biospy  . eye biospy  1979   rt eye, dx: leukemia  . EYE SURGERY  1982   rt eye  . NASAL SINUS SURGERY  1999  . PLANTAR FASCIA SURGERY  2004, 2012   rt foot, left foot  . POLYPECTOMY  04/14/2020   Procedure: POLYPECTOMY;  Surgeon: Ronald Lobo, MD;  [facility];  Service: Endoscopy;;  . RADICAL HYSTERECTOMY    . SCLEROTHERAPY  04/14/2020   Procedure: Clide Deutscher;  Surgeon: Ronald Lobo, MD;  [facility];  Service: Endoscopy;;  . skin cancer removal on shoulder  2/14  . THYROID LOBECTOMY Left 09/25/2013   Procedure: LEFT THYROID LOBECTOMY;  Surgeon: Earnstine Regal, MD;  [facility];  Service: General;  Laterality: Left;   Social History   Occupational History  . Not on file  Tobacco Use  . Smoking status: Former Smoker    Quit date: 09/15/1991    Years since quitting: 28.9  . Smokeless tobacco: Never Used  Vaping Use  . Vaping Use: Never used  Substance and Sexual Activity  . Alcohol use: Yes    Alcohol/week: 0.0 standard drinks    Comment: rarely  . Drug use: No  . Sexual activity: Never    Partners: Male    Birth control/protection: Surgical    Comment: hyst.....Marland KitchenINTERCOURSE AGE 45, SEXUAL PARTNERS LESS THAN 5

## 2020-09-05 NOTE — Telephone Encounter (Signed)
Pt is scheduled for echo on Jan 4 at 915am at [facility], pt is aware of appt

## 2020-09-05 NOTE — Telephone Encounter (Signed)
-----   Message from Marissa [name], [place] sent at 09/02/2020  4:30 PM EST ----- Her echocardiogram has not been scheduled yet -- they did not do it when she had her venous doppler.

## 2020-09-19 ENCOUNTER — Other Ambulatory Visit: Payer: Self-pay | Admitting: Family Medicine

## 2020-09-27 ENCOUNTER — Telehealth: Payer: Self-pay | Admitting: Family Medicine

## 2020-09-27 ENCOUNTER — Other Ambulatory Visit: Payer: Self-pay

## 2020-09-27 ENCOUNTER — Ambulatory Visit (HOSPITAL_COMMUNITY): Payer: Medicare HMO | Attending: Cardiology

## 2020-09-27 DIAGNOSIS — J4530 Mild persistent asthma, uncomplicated: Secondary | ICD-10-CM | POA: Diagnosis not present

## 2020-09-27 DIAGNOSIS — J301 Allergic rhinitis due to pollen: Secondary | ICD-10-CM | POA: Diagnosis not present

## 2020-09-27 DIAGNOSIS — J3081 Allergic rhinitis due to animal (cat) (dog) hair and dander: Secondary | ICD-10-CM | POA: Diagnosis not present

## 2020-09-27 DIAGNOSIS — J3089 Other allergic rhinitis: Secondary | ICD-10-CM | POA: Diagnosis not present

## 2020-09-27 DIAGNOSIS — R600 Localized edema: Secondary | ICD-10-CM | POA: Diagnosis not present

## 2020-09-27 LAB — ECHOCARDIOGRAM COMPLETE
AR max vel: 1.81 cm2
AV Area VTI: 1.93 cm2
AV Area mean vel: 1.8 cm2
AV Mean grad: 13 mmHg
AV Peak grad: 26 mmHg
Ao pk vel: 2.55 m/s
Area-P 1/2: 2.37 cm2
S' Lateral: 2.95 cm

## 2020-09-27 NOTE — Telephone Encounter (Signed)
Echocardiogram does not show signs of heart failure.  I think the swelling in the legs is due to problems with the valves in your leg veins.

## 2020-09-28 ENCOUNTER — Other Ambulatory Visit: Payer: Self-pay | Admitting: Family Medicine

## 2020-10-03 ENCOUNTER — Inpatient Hospital Stay: Payer: Medicare HMO | Attending: Hematology and Oncology | Admitting: Hematology and Oncology

## 2020-10-03 ENCOUNTER — Ambulatory Visit: Payer: Medicare HMO

## 2020-10-03 ENCOUNTER — Other Ambulatory Visit: Payer: Self-pay

## 2020-10-03 ENCOUNTER — Inpatient Hospital Stay: Payer: Medicare HMO

## 2020-10-03 ENCOUNTER — Telehealth: Payer: Self-pay

## 2020-10-03 ENCOUNTER — Encounter: Payer: Self-pay | Admitting: Hematology and Oncology

## 2020-10-03 VITALS — BP 134/70 | HR 95 | Temp 97.8°F | Resp 18 | Ht 63.0 in | Wt 350.6 lb

## 2020-10-03 DIAGNOSIS — Z856 Personal history of leukemia: Secondary | ICD-10-CM

## 2020-10-03 DIAGNOSIS — C9101 Acute lymphoblastic leukemia, in remission: Secondary | ICD-10-CM | POA: Insufficient documentation

## 2020-10-03 DIAGNOSIS — E538 Deficiency of other specified B group vitamins: Secondary | ICD-10-CM | POA: Diagnosis not present

## 2020-10-03 DIAGNOSIS — D509 Iron deficiency anemia, unspecified: Secondary | ICD-10-CM

## 2020-10-03 DIAGNOSIS — D696 Thrombocytopenia, unspecified: Secondary | ICD-10-CM

## 2020-10-03 DIAGNOSIS — D539 Nutritional anemia, unspecified: Secondary | ICD-10-CM

## 2020-10-03 LAB — CBC WITH DIFFERENTIAL/PLATELET
Abs Immature Granulocytes: 0.02 10*3/uL (ref 0.00–0.07)
Basophils Absolute: 0 10*3/uL (ref 0.0–0.1)
Basophils Relative: 1 %
Eosinophils Absolute: 0.1 10*3/uL (ref 0.0–0.5)
Eosinophils Relative: 2 %
HCT: 39.2 % (ref 36.0–46.0)
Hemoglobin: 12.8 g/dL (ref 12.0–15.0)
Immature Granulocytes: 0 %
Lymphocytes Relative: 24 %
Lymphs Abs: 1.4 10*3/uL (ref 0.7–4.0)
MCH: 29.9 pg (ref 26.0–34.0)
MCHC: 32.7 g/dL (ref 30.0–36.0)
MCV: 91.6 fL (ref 80.0–100.0)
Monocytes Absolute: 0.5 10*3/uL (ref 0.1–1.0)
Monocytes Relative: 8 %
Neutro Abs: 3.7 10*3/uL (ref 1.7–7.7)
Neutrophils Relative %: 65 %
Platelets: 107 10*3/uL — ABNORMAL LOW (ref 150–400)
RBC: 4.28 MIL/uL (ref 3.87–5.11)
RDW: 13.3 % (ref 11.5–15.5)
WBC: 5.7 10*3/uL (ref 4.0–10.5)
nRBC: 0 % (ref 0.0–0.2)

## 2020-10-03 LAB — VITAMIN B12: Vitamin B-12: 2214 pg/mL — ABNORMAL HIGH (ref 180–914)

## 2020-10-03 LAB — FERRITIN: Ferritin: 18 ng/mL (ref 11–307)

## 2020-10-03 LAB — IRON AND TIBC
Iron: 98 ug/dL (ref 41–142)
Saturation Ratios: 27 % (ref 21–57)
TIBC: 368 ug/dL (ref 236–444)
UIBC: 269 ug/dL (ref 120–384)

## 2020-10-03 MED ORDER — CYANOCOBALAMIN 1000 MCG/ML IJ SOLN
INTRAMUSCULAR | Status: AC
  Filled 2020-10-03: qty 1

## 2020-10-03 MED ORDER — CYANOCOBALAMIN 1000 MCG/ML IJ SOLN: 1000.0000 ug | Freq: Once | INTRAMUSCULAR | Status: DC

## 2020-10-03 NOTE — Progress Notes (Signed)
[facility] OFFICE PROGRESS NOTE  Hilts, Legrand Como, MD  ASSESSMENT & PLAN:  History of acute lymphoblastic leukemia (ALL) Based on her recent blood work, she have no signs of recurrence I do not believe her thrombocytopenia is due to this  Vitamin B12 deficiency She has concurrent vitamin B12 deficiency Repeat vitamin B12 level is pending We will call her with test results Her primary care doctor recently has increased the dose of vitamin B12 supplement to every 3 days for some time I suspect her vitamin B12 level will be adequate I would defer to her primary care doctor for further  Iron deficiency anemia The most likely cause of her iron deficiency anemia is related to probable gastritis/peptic ulcer disease given her symptoms and her use of NSAID She has no clinical benefit taking oral iron supplement She denies recent NSAID use She is not anemic She had EGD and colonoscopy which concur the findings that the cause of her iron deficiency is likely due to gastritis Observe closely for now She has good follow-up with primary care doctor and I recommend iron studies to be done with her primary care doctor and if she has recurrent iron deficiency anemia requiring IV iron again, she will give me a call    No orders of the defined types were placed in this encounter.   The total time spent in the appointment was 20 minutes encounter with patients including review of chart and various tests results, discussions about plan of care and coordination of care plan   All questions were answered. The patient knows to call the clinic with any problems, questions or concerns. No barriers to learning was detected.    Heath Lark, MD 1/10/202211:29 AM  INTERVAL HISTORY: Marissa [name] 55 y.o. female returns for combined iron deficiency and B12 deficiency anemia, in the setting of prior history of precursor B-cell ALL Since her last visit, she has received intravenous iron as  well as B12 injections In July, she underwent EGD and colonoscopy of which results concur findings that the source of her anemia is likely due to upper GI bleed After she received vitamin B12 supplement monthly, she continues to have fatigue Her primary care doctor increase her vitamin B12 supplement to every 3 days Subsequently, she developed some leg swelling and she was placed on furosemide She denies recent bleeding She does not take any NSAID anymore She is on low-dose aspirin for medical prevention The patient denies any recent signs or symptoms of bleeding such as spontaneous epistaxis, hematuria or hematochezia.   SUMMARY OF HEMATOLOGIC HISTORY: Marissa [name] 55 y.o. female is here because of iron deficiency anemia and thrombocytopenia. 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 Cristina Gong approximately 3 years ago  She was found to have abnormal CBC from routine blood test by her primary care doctor On review of her blood work, in August 2020, her platelet count was low at 90,000 A month later in September, when it was repeated, her platelet count improved to 137,000 By December 2020, her platelet count is normal Most recently, on January 06, 2020, her hemoglobin was 10.1 and platelet count of 111,000.  She was also found to have iron deficiency On review of her medication list, the patient  is noted to have been taking NSAID She used to take a lot of NSAID for chronic knee pain, back pain and shoulder pain. She was prescribed pantoprazole for acid reflux that was diagnosed when a trigger her asthma attack She has been on pantoprazole for approximately 2 years She was also noted to have received antibiotics recently for urinary tract  infection  She denies recent bruising/bleeding, such as spontaneous epistaxis, hematuria, melena or hematochezia She denies recent excessive menorrhagia; in fact, she had hysterectomy in 2003 for endometriosis The patient denies history of liver disease, exposure to heparin, history of cardiac murmur/prior cardiovascular surgery or recent new medications She complained of poor energy  She was prescribed oral iron supplements of which she takes daily with food but she is still having issues with chronic iron deficiency anemia On April 01, 2020, she received 1 dose of intravenous iron and B12 supplementation  On April 14, 2020, she underwent EGD and colonoscopy Colonoscopy showed  - Three 4-9 mm polyps in the descending colon, at the hepatic flexure and in the ascending colon, removed with a cold snare. Resected and retrieved. - The examined portion of the ileum was normal. - The distal rectum and anal verge are normal on retroflexion view.  EGD showed  - 1 cm hiatal hernia. - Bilious gastric fluid. - Multiple gastric polyps. Biopsied. Due to friability and minimal oozing noted, in conjunction with patient's thrombocytopenia and iron malabsorption from PPI therapy, it is possible that this could account for the patient's iron deficiency anemia. - Normal examined duodenum  Pathology report revealed A. PRE-PYLORIC POLYP, POLYPECTOMY:  - Reactive gastropathy, polypoid, ulcerated and inflamed with  necroinflammatory debris and granulation tissue.   B. STOMACH, ANTRUM, POLYPECTOMY:  - Reactive gastropathy, polypoid.   C. STOMACH, GREATER CURVE, POLYPECTOMY:  - Hyperplastic polyp.   D. COLON, ASCENDING, POLYPECTOMY:  - Sessile serrated polyp without cytologic dysplasia.   E. COLON, HEPATIC FLEXURE, POLYPECTOMY:  - Tubular adenoma without high grade dysplasia.   F. COLON, DESECNDING, POLYPECTOMY:  - Tubular adenoma without high grade dysplasia.   I have reviewed the past medical  history, past surgical history, social history and family history with the patient and they are unchanged from previous note.  ALLERGIES:  is allergic to breo ellipta [fluticasone furoate-vilanterol], erythromycin, penicillins, quinine derivatives, and sulfa antibiotics.  MEDICATIONS:  Current Outpatient Medications  Medication Sig Dispense Refill  . aspirin 81 MG tablet Take 81 mg by mouth at bedtime.     . betamethasone dipropionate 0.05 % cream Apply topically.    . Cholecalciferol (VITAMIN D3) 125 MCG (5000 UT) CAPS Take 5,000 Units by mouth daily.     . clindamycin (CLINDAGEL) 1 % gel     . Cranberry-Vitamin C-Vitamin E (CRANBERRY PLUS VITAMIN C PO) Take 1 tablet by mouth daily.     . cyanocobalamin (,VITAMIN B-12,) 1000 MCG/ML injection Inject 1 mL (1,000 mcg total) into the skin every 3 (three) days. 30 mL 3  . EPINEPHrine (EPIPEN 2-PAK) 0.3 mg/0.3 mL IJ SOAJ injection Inject 0.3 mLs (0.3 mg total) into the muscle as needed for anaphylaxis. 1 each 11  . fexofenadine (ALLEGRA) 180 MG tablet Take 180 mg by mouth daily.     . fluticasone (FLONASE) 50 MCG/ACT nasal spray Place 1 spray into both nostrils daily.    . fluticasone (FLOVENT HFA) 110 MCG/ACT inhaler Inhale 1 puff into the lungs 2 (two) times daily.     . furosemide (LASIX) 20 MG tablet TAKE 1  TABLET BY MOUTH EVERY MORNING AND 1 TAB AT NOON AS NEEDED FOR EDEMA 60 tablet 1  . HYDROcodone-acetaminophen (NORCO/VICODIN) 5-325 MG tablet Take 1 tablet by mouth 2 (two) times daily as needed for moderate pain. 60 tablet 0  . levothyroxine (SYNTHROID) 75 MCG tablet TAKE 1 TABLET EVERY DAY BEFORE BREAKFAST 90 tablet 1  . Magnesium 250 MG TABS Take 250 mg by mouth daily.     . Menatetrenone (VITAMIN K2) 100 MCG TABS Take 100 mcg by mouth daily.     . metFORMIN (GLUCOPHAGE) 500 MG tablet TAKE 1 TABLET BY MOUTH TWICE A DAY WITH FOOD 180 tablet 1  . montelukast (SINGULAIR) 10 MG tablet Take 10 mg by mouth at bedtime.   5  . nystatin  (MYCOSTATIN/NYSTOP) powder Apply topically 3 (three) times daily. (Patient taking differently: Apply 1 application topically 2 (two) times daily. ) 15 g 4  . nystatin-triamcinolone ointment (MYCOLOG) Apply 1 application topically 2 (two) times daily. 60 g 6  . pantoprazole (PROTONIX) 40 MG tablet TAKE 1 TABLET BY MOUTH EVERY DAY 90 tablet 3  . potassium chloride (KLOR-CON M10) 10 MEQ tablet TAKE 1 TABLET BY MOUTH TWICE A DAY WHEN TAKING LASIX 60 tablet 1  . pregabalin (LYRICA) 150 MG capsule Take 1 capsule (150 mg total) by mouth 2 (two) times daily. 60 capsule 6  . PROAIR HFA 108 (90 BASE) MCG/ACT inhaler Inhale 1-2 puffs into the lungs every 6 (six) hours as needed for wheezing or shortness of breath.   0  . Probiotic Product (PROBIOTIC DAILY PO) Take 1 capsule by mouth daily.     . traMADol (ULTRAM) 50 MG tablet Take 1 tablet (50 mg total) by mouth 2 (two) times daily as needed. 60 tablet 0   Current Facility-Administered Medications  Medication Dose Route Frequency Provider Last Rate Last Admin  . cyanocobalamin ((VITAMIN B-12)) injection 1,000 mcg  1,000 mcg Intramuscular Once Heath Lark, MD         REVIEW OF SYSTEMS:   Constitutional: Denies fevers, chills or night sweats Eyes: Denies blurriness of vision Ears, nose, mouth, throat, and face: Denies mucositis or sore throat Respiratory: Denies cough, dyspnea or wheezes Cardiovascular: Denies palpitation, chest discomfort or lower extremity swelling Gastrointestinal:  Denies nausea, heartburn or change in bowel habits Skin: Denies abnormal skin rashes Lymphatics: Denies new lymphadenopathy or easy bruising Neurological:Denies numbness, tingling or new weaknesses Behavioral/Psych: Mood is stable, no new changes  All other systems were reviewed with the patient and are negative.  PHYSICAL EXAMINATION: ECOG PERFORMANCE STATUS: 1 - Symptomatic but completely ambulatory  Vitals:   10/03/20 1122  BP: 134/70  Pulse: 95  Resp: 18   Temp: 97.8 F (36.6 C)  SpO2: 96%   Filed Weights   10/03/20 1122  Weight: (!) 350 lb 9.6 oz (159 kg)    GENERAL:alert, no distress and comfortable NEURO: alert & oriented x 3 with fluent speech, no focal motor/sensory deficits  LABORATORY DATA:  I have reviewed the data as listed     Component Value Date/Time   NA 143 04/06/2020 0900   K 4.1 04/06/2020 0900   CL 107 04/06/2020 0900   CO2 28 04/06/2020 0900   GLUCOSE 149 (H) 04/06/2020 0900   BUN 10 04/06/2020 0900   CREATININE 0.68 04/06/2020 0900   CALCIUM 8.8 04/06/2020 0900   PROT 6.5 04/06/2020 0900   AST 24 04/06/2020 0900   ALT 17 04/06/2020 0900   BILITOT 0.9 04/06/2020 0900   GFRNONAA >  90 09/14/2013 0950   GFRAA >90 09/14/2013 0950    No results found for: SPEP, UPEP  Lab Results  Component Value Date   WBC 5.7 10/03/2020   NEUTROABS 3.7 10/03/2020   HGB 12.8 10/03/2020   HCT 39.2 10/03/2020   MCV 91.6 10/03/2020   PLT 107 (L) 10/03/2020      Chemistry      Component Value Date/Time   NA 143 04/06/2020 0900   K 4.1 04/06/2020 0900   CL 107 04/06/2020 0900   CO2 28 04/06/2020 0900   BUN 10 04/06/2020 0900   CREATININE 0.68 04/06/2020 0900      Component Value Date/Time   CALCIUM 8.8 04/06/2020 0900   AST 24 04/06/2020 0900   ALT 17 04/06/2020 0900   BILITOT 0.9 04/06/2020 0900

## 2020-10-03 NOTE — Assessment & Plan Note (Addendum)
She has concurrent vitamin B12 deficiency Repeat vitamin B12 level is pending We will call her with test results Her primary care doctor recently has increased the dose of vitamin B12 supplement to every 3 days for some time I suspect her vitamin B12 level will be adequate I would defer to her primary care doctor for further

## 2020-10-03 NOTE — Assessment & Plan Note (Addendum)
The most likely cause of her iron deficiency anemia is related to probable gastritis/peptic ulcer disease given her symptoms and her use of NSAID She has no clinical benefit taking oral iron supplement She denies recent NSAID use She is not anemic She had EGD and colonoscopy which concur the findings that the cause of her iron deficiency is likely due to gastritis Observe closely for now She has good follow-up with primary care doctor and I recommend iron studies to be done with her primary care doctor and if she has recurrent iron deficiency anemia requiring IV iron again, she will give me a call

## 2020-10-03 NOTE — Assessment & Plan Note (Signed)
Based on her recent blood work, she have no signs of recurrence I do not believe her thrombocytopenia is due to this  

## 2020-10-03 NOTE — Telephone Encounter (Signed)
Called and given below message. She verbalized understanding. 

## 2020-10-03 NOTE — Telephone Encounter (Signed)
-----   Message from Heath Lark, MD sent at 10/03/2020  2:23 PM EST ----- Regarding: labs Pls call and let her know B12 is very high Ok to hold for a few month and repeat with primary doctor Iron is ok

## 2020-10-14 DIAGNOSIS — Z20828 Contact with and (suspected) exposure to other viral communicable diseases: Secondary | ICD-10-CM | POA: Diagnosis not present

## 2020-10-17 ENCOUNTER — Ambulatory Visit: Payer: Medicare HMO | Admitting: Family Medicine

## 2020-10-17 ENCOUNTER — Other Ambulatory Visit: Payer: Self-pay | Admitting: Family Medicine

## 2020-10-19 ENCOUNTER — Telehealth: Payer: Self-pay

## 2020-10-19 MED ORDER — HYDROCOD POLST-CPM POLST ER 10-8 MG/5ML PO SUER: 5.0000 mL | Freq: Two times a day (BID) | ORAL | 0 refills | Status: DC | PRN

## 2020-10-19 MED ORDER — AZITHROMYCIN 250 MG PO TABS: ORAL_TABLET | ORAL | 0 refills | Status: DC

## 2020-10-19 NOTE — Telephone Encounter (Signed)
Patient called she stated she has been sick for 3 weeks she stated she went to get tested for Covid she hasn't received the results yet she would like cough syrup to be called into the pharmacy she stated when she coughs her left breast hurts she stated  when she coughs nothing comes up she stated she has been having headaches and a fever she has been taking tylenol but it hasn't helped she would like something for that to be called in for that as [phone]

## 2020-10-19 NOTE — Telephone Encounter (Signed)
Please advise 

## 2020-10-19 NOTE — Telephone Encounter (Signed)
She has had no problems with the z-pack in the past.

## 2020-10-19 NOTE — Addendum Note (Signed)
Addended by: Hortencia Pilar on: 10/19/2020 11:39 AM   Modules accepted: Orders

## 2020-10-20 ENCOUNTER — Telehealth: Payer: Self-pay | Admitting: Family Medicine

## 2020-10-20 MED ORDER — PREDNISONE 10 MG PO TABS: ORAL_TABLET | ORAL | 0 refills | Status: DC

## 2020-10-20 NOTE — Telephone Encounter (Signed)
Pt called stating she believes she has some type of respiratory sickness and would like something called in for it; she would like a CB from terri so she can discuss it more.  [phone]

## 2020-10-20 NOTE — Telephone Encounter (Signed)
The patient said she thought she could take the zpack, but is afraid to try it due to SOB caused by emycin in the past. Her test results have come back: neg for influenza A&B, but positive for covid. She asks if a round of prednisone would help her. Her cough is still bad, despite the tussionex.

## 2020-10-21 NOTE — Telephone Encounter (Signed)
I called and left voice mail that this was sent in yesterday, late afternoon. Advised her to continue with fluids and rest. Call/message [place] if needed.

## 2020-10-25 ENCOUNTER — Ambulatory Visit: Payer: Medicare HMO | Admitting: Family Medicine

## 2020-11-08 ENCOUNTER — Other Ambulatory Visit: Payer: Self-pay

## 2020-11-08 ENCOUNTER — Ambulatory Visit (INDEPENDENT_AMBULATORY_CARE_PROVIDER_SITE_OTHER): Payer: Medicare HMO | Admitting: Family Medicine

## 2020-11-08 ENCOUNTER — Encounter: Payer: Self-pay | Admitting: Family Medicine

## 2020-11-08 VITALS — BP 148/70 | HR 103 | Ht 63.0 in | Wt 341.0 lb

## 2020-11-08 DIAGNOSIS — E538 Deficiency of other specified B group vitamins: Secondary | ICD-10-CM | POA: Diagnosis not present

## 2020-11-08 DIAGNOSIS — R5383 Other fatigue: Secondary | ICD-10-CM | POA: Diagnosis not present

## 2020-11-08 DIAGNOSIS — E119 Type 2 diabetes mellitus without complications: Secondary | ICD-10-CM | POA: Diagnosis not present

## 2020-11-08 DIAGNOSIS — L750 Bromhidrosis: Secondary | ICD-10-CM | POA: Diagnosis not present

## 2020-11-08 MED ORDER — TRAMADOL HCL 50 MG PO TABS: 50.0000 mg | ORAL_TABLET | Freq: Two times a day (BID) | ORAL | 0 refills | Status: DC | PRN

## 2020-11-08 MED ORDER — CYCLOBENZAPRINE HCL 10 MG PO TABS: 10.0000 mg | ORAL_TABLET | Freq: Three times a day (TID) | ORAL | 3 refills | Status: AC | PRN

## 2020-11-08 NOTE — Progress Notes (Signed)
Office Visit Note   Patient: Marissa [name]           Date of Birth: 04-04-1966           MRN: [id number] Visit Date: 11/08/2020 Requested by: Eunice Blase, MD [place],  [place] [id number] PCP: Eunice Blase, MD  Subjective: Chief Complaint  Patient presents with  . Other    Follow up post covid - still some issues with breathing and fatigue. Has noticed an odor from her body x 5-6 weeks ago. She asks if it is due to the vitamin B12 injections (has stopped per her oncologist, due to B12 level). Requests refills of tramadol and cyclobenzaprine (not had this for a while).    HPI: She is here for routine monitoring.  She was tested positive for Covid about 5 or 6 weeks ago after her sister came home sick from work.  She tested negative for influenza.  Her acute symptoms of illness lasted for longer than 2 weeks, but she continues to feel short of breath easily.  No further fevers.  She has fatigue as well.  She complains of a strange body odor for the past 5 or 6 months.  Other people in her house noticed it as well.  Pain got worse during her Covid illness.  She was wondering whether B12 injections might have something to do with that, but that is not listed as a common side effect.  She is not on any other new medications.  Her diet has not changed.  Her oncologist took her off B12 injections because her level was over 2000.  She has not used it for probably a month.  She was feeling better on it from an energy standpoint.  We are due for monitoring of diabetes.                ROS:   All other systems were reviewed and are negative.  Objective: Vital Signs: BP (!) 148/70   Pulse (!) 103   Ht 5\' 3"  (1.6 m)   Wt (!) 341 lb (154.7 kg)   LMP 07/23/2002   BMI 60.41 kg/m   Physical Exam:  General:  Alert and oriented, in no acute distress. Pulm:  Breathing unlabored. Psy:  Normal mood, congruent affect.  CV: Regular rate and rhythm without murmurs, rubs, or  gallops.  2+ radial and posterior tibial pulses. Lungs: Clear to auscultation throughout with no wheezing or areas of consolidation.    Imaging: No results found.  Assessment & Plan: 1.  Diabetes -We will check A1c today.  2.  Abnormal body odor, etiology uncertain. -Check renal function and liver function today as well as uric acid level.  3.  B12 deficiency -Depending on levels, we may need to resume this at a less frequent interval.     Procedures: No procedures performed        PMFS History: Patient Active Problem List   Diagnosis Date Noted  . Vitamin B12 deficiency 04/01/2020  . Iron deficiency anemia 01/15/2020  . Deficiency anemia 01/15/2020  . Thrombocytopenia ([place]) 01/15/2020  . Diabetes ([place]) 09/07/2019  . Osteopenia of forearm 09/07/2019  . Gastroesophageal reflux disease 05/08/2019  . Hyperglycemia 05/08/2019  . Urinary incontinence 05/08/2019  . History of colon polyps 05/08/2019  . Mild intermittent asthma without complication 84/69/6295  . BV (bacterial vaginosis) 01/01/2013  . Anal fissure 07/07/2012  . History of acute lymphoblastic leukemia (ALL) 01/21/2012  . Endometriosis 01/21/2012  . Class 3  severe obesity without serious comorbidity with body mass index (BMI) of 50.0 to 59.9 in adult ([place]) 11/28/2011  . Multinodular goiter (nontoxic), dominant nodule left lobe 07/18/2011   Past Medical History:  Diagnosis Date  . Abdominal pain    INTERMITANT  . Anemia   . Asthma   . GERD (gastroesophageal reflux disease)   . H/O hiatal hernia   . Hemorrhoids   . History of transfusion   . IBS (irritable bowel syndrome)   . Leukemia ([place])    as a child, acute lymphatic with T cell  . Plantar fasciitis   . Plantar fasciitis of left foot FALL 2012   HAD SURGERY TO FIX IT  . Prediabetes   . Rectal pain   . 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 Thyroid nodule     Family History  Problem Relation Age of Onset  . Cancer Mother 68        LUNG  . Diabetes Mother   . Diabetes Father   . Stroke Father   . Heart disease Father   . Vision loss Father        GLAUCOMA  . Cancer Maternal Aunt        breast  . Breast cancer Maternal Aunt   . Diverticulosis Other   . Colon cancer Neg Hx     Past Surgical History:  Procedure Laterality Date  . ABDOMINAL HYSTERECTOMY  2003   BSO  . BIOPSY  04/14/2020   Procedure: BIOPSY;  Surgeon: Ronald Lobo, MD;  Location: [facility];  Service: Endoscopy;;  . BIOPSY THYROID  11/2009   benign  . carpel tunnel  2004   rt hand  . COLONOSCOPY WITH PROPOFOL N/A 11/08/2016   Procedure: COLONOSCOPY WITH PROPOFOL;  Surgeon: Ronald Lobo, MD;  Location: [facility];  Service: Endoscopy;  Laterality: N/A;  . COLONOSCOPY WITH PROPOFOL N/A 04/14/2020   Procedure: COLONOSCOPY WITH PROPOFOL;  Surgeon: Ronald Lobo, MD;  Location: [facility];  Service: Endoscopy;  Laterality: N/A;  . ESOPHAGOGASTRODUODENOSCOPY (EGD) WITH PROPOFOL N/A 04/14/2020   Procedure: ESOPHAGOGASTRODUODENOSCOPY (EGD) WITH PROPOFOL;  Surgeon: Ronald Lobo, MD;  Location: [facility];  Service: Endoscopy;  Laterality: N/A;  . eye biospy  1979   dx with leukemia from this biospy  . eye biospy  1979   rt eye, dx: leukemia  . EYE SURGERY  1982   rt eye  . NASAL SINUS SURGERY  1999  . PLANTAR FASCIA SURGERY  2004, 2012   rt foot, left foot  . POLYPECTOMY  04/14/2020   Procedure: POLYPECTOMY;  Surgeon: Ronald Lobo, MD;  Location: [facility];  Service: Endoscopy;;  . RADICAL HYSTERECTOMY    . SCLEROTHERAPY  04/14/2020   Procedure: Clide Deutscher;  Surgeon: Ronald Lobo, MD;  Location: [facility];  Service: Endoscopy;;  . skin cancer removal on shoulder  2/14  . THYROID LOBECTOMY Left 09/25/2013   Procedure: LEFT THYROID LOBECTOMY;  Surgeon: Earnstine Regal, MD;  Location: [facility];  Service: General;  Laterality: Left;   Social History   Occupational History  . Not on file  Tobacco Use  . Smoking status:  Former Smoker    Quit date: 09/15/1991    Years since quitting: 29.1  . Smokeless tobacco: Never Used  Vaping Use  . Vaping Use: Never used  Substance and Sexual Activity  . Alcohol use: Yes    Alcohol/week: 0.0 standard drinks    Comment: rarely  . Drug use: No  . Sexual  activity: Never    Partners: Male    Birth control/protection: Surgical    Comment: hyst.....Marland KitchenINTERCOURSE AGE 40, SEXUAL PARTNERS LESS THAN 5

## 2020-11-09 ENCOUNTER — Telehealth: Payer: Self-pay | Admitting: Family Medicine

## 2020-11-09 LAB — CBC WITH DIFFERENTIAL/PLATELET
Absolute Monocytes: 424 cells/uL (ref 200–950)
Basophils Absolute: 20 cells/uL (ref 0–200)
Basophils Relative: 0.5 %
Eosinophils Absolute: 92 cells/uL (ref 15–500)
Eosinophils Relative: 2.3 %
HCT: 40.1 % (ref 35.0–45.0)
Hemoglobin: 12.8 g/dL (ref 11.7–15.5)
Lymphs Abs: 996 cells/uL (ref 850–3900)
MCH: 29.3 pg (ref 27.0–33.0)
MCHC: 31.9 g/dL — ABNORMAL LOW (ref 32.0–36.0)
MCV: 91.8 fL (ref 80.0–100.0)
MPV: 11.8 fL (ref 7.5–12.5)
Monocytes Relative: 10.6 %
Neutro Abs: 2468 cells/uL (ref 1500–7800)
Neutrophils Relative %: 61.7 %
Platelets: 75 10*3/uL — ABNORMAL LOW (ref 140–400)
RBC: 4.37 10*6/uL (ref 3.80–5.10)
RDW: 13.8 % (ref 11.0–15.0)
Total Lymphocyte: 24.9 %
WBC: 4 10*3/uL (ref 3.8–10.8)

## 2020-11-09 LAB — HEPATIC FUNCTION PANEL
AG Ratio: 1.2 (calc) (ref 1.0–2.5)
ALT: 18 U/L (ref 6–29)
AST: 24 U/L (ref 10–35)
Albumin: 3.6 g/dL (ref 3.6–5.1)
Alkaline phosphatase (APISO): 106 U/L (ref 37–153)
Bilirubin, Direct: 0.3 mg/dL — ABNORMAL HIGH (ref 0.0–0.2)
Globulin: 2.9 g/dL (calc) (ref 1.9–3.7)
Indirect Bilirubin: 0.9 mg/dL (calc) (ref 0.2–1.2)
Total Bilirubin: 1.2 mg/dL (ref 0.2–1.2)
Total Protein: 6.5 g/dL (ref 6.1–8.1)

## 2020-11-09 LAB — URIC ACID: Uric Acid, Serum: 5.3 mg/dL (ref 2.5–7.0)

## 2020-11-09 LAB — HEMOGLOBIN A1C
Hgb A1c MFr Bld: 7 % of total Hgb — ABNORMAL HIGH (ref <5.7)
Mean Plasma Glucose: 154 mg/dL
eAG (mmol/L): 8.5 mmol/L

## 2020-11-09 LAB — BASIC METABOLIC PANEL
BUN: 8 mg/dL (ref 7–25)
CO2: 33 mmol/L — ABNORMAL HIGH (ref 20–32)
Calcium: 8.4 mg/dL — ABNORMAL LOW (ref 8.6–10.4)
Chloride: 101 mmol/L (ref 98–110)
Creat: 0.65 mg/dL (ref 0.50–1.05)
Glucose, Bld: 231 mg/dL — ABNORMAL HIGH (ref 65–99)
Potassium: 3.9 mmol/L (ref 3.5–5.3)
Sodium: 142 mmol/L (ref 135–146)

## 2020-11-09 LAB — VITAMIN B12: Vitamin B-12: 572 pg/mL (ref 200–1100)

## 2020-11-09 NOTE — Telephone Encounter (Signed)
Labs look good except for diabetes, which is significantly worse with A1C of 7.0 and glucose of 231.  Not sure if this explains the strange odor you're experiencing, but it's very important to work hard on following a diabetic diet.  B12 level is in a good range right now.  Could possibly start once weekly injections if desired, for improved energy.

## 2020-11-30 ENCOUNTER — Other Ambulatory Visit: Payer: Self-pay | Admitting: Family Medicine

## 2020-12-14 ENCOUNTER — Other Ambulatory Visit: Payer: Self-pay | Admitting: Family Medicine

## 2021-01-07 ENCOUNTER — Other Ambulatory Visit: Payer: Self-pay | Admitting: Family Medicine

## 2021-02-06 ENCOUNTER — Ambulatory Visit: Payer: Self-pay

## 2021-02-06 ENCOUNTER — Other Ambulatory Visit: Payer: Self-pay

## 2021-02-06 ENCOUNTER — Encounter: Payer: Self-pay | Admitting: Family Medicine

## 2021-02-06 ENCOUNTER — Ambulatory Visit (INDEPENDENT_AMBULATORY_CARE_PROVIDER_SITE_OTHER): Payer: Medicare HMO | Admitting: Family Medicine

## 2021-02-06 DIAGNOSIS — E119 Type 2 diabetes mellitus without complications: Secondary | ICD-10-CM

## 2021-02-06 DIAGNOSIS — M25551 Pain in right hip: Secondary | ICD-10-CM

## 2021-02-06 DIAGNOSIS — Z6841 Body Mass Index (BMI) 40.0 and over, adult: Secondary | ICD-10-CM | POA: Diagnosis not present

## 2021-02-06 DIAGNOSIS — E538 Deficiency of other specified B group vitamins: Secondary | ICD-10-CM

## 2021-02-06 DIAGNOSIS — R5383 Other fatigue: Secondary | ICD-10-CM | POA: Diagnosis not present

## 2021-02-06 DIAGNOSIS — E042 Nontoxic multinodular goiter: Secondary | ICD-10-CM

## 2021-02-06 MED ORDER — METFORMIN HCL 500 MG PO TABS: 1.0000 | ORAL_TABLET | Freq: Two times a day (BID) | ORAL | 1 refills | Status: DC

## 2021-02-06 MED ORDER — LEVOTHYROXINE SODIUM 75 MCG PO TABS: ORAL_TABLET | ORAL | 1 refills | Status: AC

## 2021-02-06 MED ORDER — PREGABALIN 150 MG PO CAPS: 150.0000 mg | ORAL_CAPSULE | Freq: Two times a day (BID) | ORAL | 3 refills | Status: DC

## 2021-02-06 MED ORDER — MOMETASONE FUROATE 0.1 % EX CREA: 1.0000 "application " | TOPICAL_CREAM | Freq: Every day | CUTANEOUS | 0 refills | Status: DC

## 2021-02-06 MED ORDER — CYANOCOBALAMIN 1000 MCG/ML IJ SOLN: 1000.0000 ug | INTRAMUSCULAR | 3 refills | Status: DC

## 2021-02-06 MED ORDER — PHENTERMINE HCL 37.5 MG PO TABS: 37.5000 mg | ORAL_TABLET | Freq: Every day | ORAL | 3 refills | Status: DC

## 2021-02-06 MED ORDER — TRAMADOL HCL 50 MG PO TABS: ORAL_TABLET | ORAL | 0 refills | Status: DC

## 2021-02-06 NOTE — Progress Notes (Signed)
Office Visit Note   Patient: Marissa [name]           Date of Birth: [birth year]           MRN: [id number] Visit Date: 02/06/2021 Requested by: Eunice Blase, MD [place],  [place] [address] PCP: Eunice Blase, MD  Subjective: Chief Complaint  Patient presents with  . other    Still feeling Tired.  Doing B 12 once a week.  Needs RF on Lyrica, Metformin, Synthroid, Tramadol. No more Odor.    HPI: She is here for routine monitoring of medical conditions.  From a diabetes standpoint she is taking metformin, does not check her blood sugars.  She is asymptomatic.  She still has fatigue, but seems to be better.  She thinks it just took a while to recover from [place].  Continues to have urinary issues.  The medication prescribed by urologist did not help.  She plans to try an over-the-counter supplement.  She is taking B12 once per week.  She needs to have her levels checked.  She feels somewhat better on it.  Her right hip has been bothering her quite a bit.  Pain in the groin area.  She still struggles with super morbid obesity and is interested in trying an appetite suppressant.  She needs refills of all of her medications.                ROS:   All other systems were reviewed and are negative.  Objective: Vital Signs: LMP 07/23/2002   Physical Exam:  General:  Alert and oriented, in no acute distress. Pulm:  Breathing unlabored. Psy:  Normal mood, congruent affect. Skin: Erythematous rash on the anterior right shin, no sign of cellulitis.  2+ edema. Neck: No thyromegaly. CV: Regular rate and rhythm without murmurs, rubs, or gallops.  2+ bilateral lower extremity peripheral edema.  2+ radial and posterior tibial pulses. Lungs: Clear to auscultation throughout with no wheezing or areas of consolidation. Right hip: Very uncomfortable with passive internal rotation but range of motion is still adequate.    Imaging: No results found.  Assessment &  Plan: 1.  Diabetes -Recheck labs today.  Follow-up in 3 months.  2.  Fatigue -Continue with B12.  Check levels today.  3.  Right hip pain, concerning for DJD.  Cannot rule out AVN. - X-rays today.  She is not sure if she wants to consider an injection per Dr. Ernestina Patches.  4.  Obesity - Trial of phentermine.  Recheck in 3 months.     Procedures: No procedures performed        PMFS History: Patient Active Problem List   Diagnosis Date Noted  . Vitamin B12 deficiency 04/01/2020  . Iron deficiency anemia 01/15/2020  . Deficiency anemia 01/15/2020  . Thrombocytopenia ([place]) 01/15/2020  . Diabetes ([place]) 09/07/2019  . Osteopenia of forearm 09/07/2019  . Gastroesophageal reflux disease 05/08/2019  . Hyperglycemia 05/08/2019  . Urinary incontinence 05/08/2019  . History of colon polyps 05/08/2019  . Mild intermittent asthma without complication 53/61/4431  . BV (bacterial vaginosis) 01/01/2013  . Anal fissure 07/07/2012  . History of acute lymphoblastic leukemia (ALL) 01/21/2012  . Endometriosis 01/21/2012  . Class 3 severe obesity without serious comorbidity with body mass index (BMI) of 50.0 to 59.9 in adult ([place]) 11/28/2011  . Multinodular goiter (nontoxic), dominant nodule left lobe 07/18/2011   Past Medical History:  Diagnosis Date  . Abdominal pain    INTERMITANT  . Anemia   .  Asthma   . GERD (gastroesophageal reflux disease)   . H/O hiatal hernia   . Hemorrhoids   . History of transfusion   . IBS (irritable bowel syndrome)   . Leukemia ([place])    as a child, acute lymphatic with T cell  . Plantar fasciitis   . Plantar fasciitis of left foot FALL 2012   HAD SURGERY TO FIX IT  . Prediabetes   . Rectal pain   . 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 Thyroid nodule     Family History  Problem Relation Age of Onset  . Cancer Mother 23       LUNG  . Diabetes Mother   . Diabetes Father   . Stroke Father   . Heart disease Father   . Vision loss Father         GLAUCOMA  . Cancer Maternal Aunt        breast  . Breast cancer Maternal Aunt   . Diverticulosis Other   . Colon cancer Neg Hx     Past Surgical History:  Procedure Laterality Date  . ABDOMINAL HYSTERECTOMY  2003   BSO  . BIOPSY  04/14/2020   Procedure: BIOPSY;  Surgeon: Ronald Lobo, MD;  [facility];  Service: Endoscopy;;  . BIOPSY THYROID  11/2009   benign  . carpel tunnel  2004   rt hand  . COLONOSCOPY WITH PROPOFOL N/A 11/08/2016   Procedure: COLONOSCOPY WITH PROPOFOL;  Surgeon: Ronald Lobo, MD;  [facility];  Service: Endoscopy;  Laterality: N/A;  . COLONOSCOPY WITH PROPOFOL N/A 04/14/2020   Procedure: COLONOSCOPY WITH PROPOFOL;  Surgeon: Ronald Lobo, MD;  [facility];  Service: Endoscopy;  Laterality: N/A;  . ESOPHAGOGASTRODUODENOSCOPY (EGD) WITH PROPOFOL N/A 04/14/2020   Procedure: ESOPHAGOGASTRODUODENOSCOPY (EGD) WITH PROPOFOL;  Surgeon: Ronald Lobo, MD;  [facility];  Service: Endoscopy;  Laterality: N/A;  . eye biospy  1979   dx with leukemia from this biospy  . eye biospy  1979   rt eye, dx: leukemia  . EYE SURGERY  1982   rt eye  . NASAL SINUS SURGERY  1999  . PLANTAR FASCIA SURGERY  2004, 2012   rt foot, left foot  . POLYPECTOMY  04/14/2020   Procedure: POLYPECTOMY;  Surgeon: Ronald Lobo, MD;  [facility];  Service: Endoscopy;;  . RADICAL HYSTERECTOMY    . SCLEROTHERAPY  04/14/2020   Procedure: Clide Deutscher;  Surgeon: Ronald Lobo, MD;  [facility];  Service: Endoscopy;;  . skin cancer removal on shoulder  2/14  . THYROID LOBECTOMY Left 09/25/2013   Procedure: LEFT THYROID LOBECTOMY;  Surgeon: Earnstine Regal, MD;  [facility];  Service: General;  Laterality: Left;   Social History   Occupational History  . Not on file  Tobacco Use  . Smoking status: Former Smoker    Quit date: 09/15/1991    Years since quitting: 29.4  . Smokeless tobacco: Never Used  Vaping Use  .  Vaping Use: Never used  Substance and Sexual Activity  . Alcohol use: Yes    Alcohol/week: 0.0 standard drinks    Comment: rarely  . Drug use: No  . Sexual activity: Never    Partners: Male    Birth control/protection: Surgical    Comment: hyst.....Marland KitchenINTERCOURSE AGE 36, SEXUAL PARTNERS LESS THAN 5

## 2021-02-07 ENCOUNTER — Telehealth: Payer: Self-pay | Admitting: Family Medicine

## 2021-02-07 LAB — BASIC METABOLIC PANEL
BUN: 8 mg/dL (ref 7–25)
CO2: 31 mmol/L (ref 20–32)
Calcium: 9.1 mg/dL (ref 8.6–10.4)
Chloride: 105 mmol/L (ref 98–110)
Creat: 0.56 mg/dL (ref 0.50–1.05)
Glucose, Bld: 122 mg/dL — ABNORMAL HIGH (ref 65–99)
Potassium: 3.9 mmol/L (ref 3.5–5.3)
Sodium: 142 mmol/L (ref 135–146)

## 2021-02-07 LAB — HEMOGLOBIN A1C
Hgb A1c MFr Bld: 5.8 % of total Hgb — ABNORMAL HIGH (ref <5.7)
Mean Plasma Glucose: 120 mg/dL
eAG (mmol/L): 6.6 mmol/L

## 2021-02-07 LAB — VITAMIN B12: Vitamin B-12: 1806 pg/mL — ABNORMAL HIGH (ref 200–1100)

## 2021-02-07 LAB — TSH: TSH: 2.48 mIU/L

## 2021-02-07 NOTE — Telephone Encounter (Signed)
I do not see a stress fracture in her hip.  Mild arthritis.

## 2021-02-07 NOTE — Telephone Encounter (Signed)
Patient called. She would like to know what the xray's showed. Her call back number is [phone]

## 2021-02-07 NOTE — Telephone Encounter (Signed)
Labs look good!  Recheck in 4-6 months.

## 2021-02-07 NOTE — Telephone Encounter (Signed)
Please advise 

## 2021-02-08 NOTE — Telephone Encounter (Signed)
It's typical for the levels to be this high with injections.  But if her oncologist doesn't want the levels that high, she can stop them (or decrease to once monthly).

## 2021-02-08 NOTE — Telephone Encounter (Signed)
I called and advised the patient. She said she will try once a month and we can recheck at her August ov to see if it is an adequate amount.

## 2021-02-08 NOTE — Telephone Encounter (Signed)
I called and advised the patient of the results.   She asks if she should continue with the B12 injections since the level was high again. She had only been back on weekly injections x 2 months again. The patient states her oncologist had told her that it is not good for the B12 to be too high. Please advise.

## 2021-04-23 ENCOUNTER — Other Ambulatory Visit: Payer: Self-pay | Admitting: Family Medicine

## 2021-05-01 NOTE — Telephone Encounter (Signed)
Pt calling asking for a refill on her prescription of tramadol. The best pharmacy is the one on file and the best call number is [phone].

## 2021-05-09 ENCOUNTER — Encounter: Payer: Self-pay | Admitting: Family Medicine

## 2021-05-09 ENCOUNTER — Other Ambulatory Visit: Payer: Self-pay

## 2021-05-09 ENCOUNTER — Ambulatory Visit (INDEPENDENT_AMBULATORY_CARE_PROVIDER_SITE_OTHER): Payer: Medicare HMO | Admitting: Family Medicine

## 2021-05-09 VITALS — BP 116/71 | HR 87 | Ht 63.0 in | Wt 322.2 lb

## 2021-05-09 DIAGNOSIS — R5383 Other fatigue: Secondary | ICD-10-CM | POA: Diagnosis not present

## 2021-05-09 DIAGNOSIS — E042 Nontoxic multinodular goiter: Secondary | ICD-10-CM

## 2021-05-09 DIAGNOSIS — E119 Type 2 diabetes mellitus without complications: Secondary | ICD-10-CM | POA: Diagnosis not present

## 2021-05-09 DIAGNOSIS — R600 Localized edema: Secondary | ICD-10-CM

## 2021-05-09 DIAGNOSIS — E538 Deficiency of other specified B group vitamins: Secondary | ICD-10-CM

## 2021-05-09 DIAGNOSIS — Z6841 Body Mass Index (BMI) 40.0 and over, adult: Secondary | ICD-10-CM

## 2021-05-09 NOTE — Progress Notes (Signed)
Office Visit Note   Patient: Marissa [name]           Date of Birth: October 23, 1965           MRN: [id number] Visit Date: 05/09/2021 Requested by: Eunice Blase, MD [place],  [place] [address] PCP: Eunice Blase, MD  Subjective: Chief Complaint  Patient presents with   Other    3 months follow up of medical issues. Not taking the vitamin B12 anymore. Her energy level/strength is coming back (post covid).  Will be reestablishing with Dr. Laurann Montana in November for primary care.    HPI: She is here for routine monitoring of medical conditions.  From a diabetes standpoint she is taking metformin without side effects.  Her last A1c was 5.8.  She does not check her blood sugars regularly but when she does, they are usually around 130 fasting in the morning.  No lesions on her feet.  She does better off B12 injections.  Overall her energy has improved, she thinks it is because she has recovered from COVID illness and also she is getting out and doing more with her boyfriend.  She continues to have bilateral leg edema, especially on the right.  This has resulted in a venous insufficiency rash.  Topical creams are not helping.  She wonders what else can be done.                ROS:   All other systems were reviewed and are negative.  Objective: Vital Signs: BP 116/71 (BP Location: Left Arm, Patient Position: Sitting, Cuff Size: Large)   Pulse 87   Ht '5\' 3"'$  (1.6 m)   Wt (!) 322 lb 3.2 oz (146.1 kg)   LMP 07/23/2002   BMI 57.08 kg/m   Physical Exam:  General:  Alert and oriented, in no acute distress. Pulm:  Breathing unlabored. Psy:  Normal mood, congruent affect. Skin:venous stasis changes in her right lower extremity. Neck: No thyromegaly or nodules. CV: Regular rate and rhythm without murmurs, rubs, or gallops.  2+ bilateral lower extremity peripheral edema.  2+ radial and posterior tibial pulses. Lungs: Clear to auscultation throughout with no wheezing or areas of  consolidation.    Imaging: No results found.  Assessment & Plan: Diabetes, has been under good control. -Labs today.  Follow-up in 3 months with Dr. Laurann Montana.  2.  B12 deficiency, feeling better off injections -Recheck levels today.  3.  Bilateral venous insufficiency lower extremities - Dynaflex wraps followed by compression stockings.     Procedures: No procedures performed        PMFS History: Patient Active Problem List   Diagnosis Date Noted   Vitamin B12 deficiency 04/01/2020   Iron deficiency anemia 01/15/2020   Deficiency anemia 01/15/2020   Thrombocytopenia ([place]) 01/15/2020   Diabetes ([place]) 09/07/2019   Osteopenia of forearm 09/07/2019   Gastroesophageal reflux disease 05/08/2019   Hyperglycemia 05/08/2019   Urinary incontinence 05/08/2019   History of colon polyps 05/08/2019   Mild intermittent asthma without complication [id number]   BV (bacterial vaginosis) 01/01/2013   Anal fissure 07/07/2012   History of acute lymphoblastic leukemia (ALL) 01/21/2012   Endometriosis 01/21/2012   Class 3 severe obesity without serious comorbidity with body mass index (BMI) of 50.0 to 59.9 in adult ([place]) 11/28/2011   Multinodular goiter (nontoxic), dominant nodule left lobe 07/18/2011   Past Medical History:  Diagnosis Date   Abdominal pain    INTERMITANT   Anemia    Asthma  GERD (gastroesophageal reflux disease)    H/O hiatal hernia    Hemorrhoids    History of transfusion    IBS (irritable bowel syndrome)    Leukemia ([place])    as a child, acute lymphatic with T cell   Plantar fasciitis    Plantar fasciitis of left foot FALL 2012   HAD SURGERY TO FIX IT   Prediabetes    Rectal pain    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   Thyroid nodule     Family History  Problem Relation Age of Onset   Cancer Mother 3       LUNG   Diabetes Mother    Diabetes Father    Stroke Father    Heart disease Father    Vision loss Father        GLAUCOMA   Cancer  Maternal Aunt        breast   Breast cancer Maternal Aunt    Diverticulosis Other    Colon cancer Neg Hx     Past Surgical History:  Procedure Laterality Date   ABDOMINAL HYSTERECTOMY  2003   BSO   BIOPSY  04/14/2020   Procedure: BIOPSY;  Surgeon: Ronald Lobo, MD;  [facility];  Service: Endoscopy;;   BIOPSY THYROID  11/2009   benign   carpel tunnel  2004   rt hand   COLONOSCOPY WITH PROPOFOL N/A 11/08/2016   Procedure: COLONOSCOPY WITH PROPOFOL;  Surgeon: Ronald Lobo, MD;  Location: [facility];  Service: Endoscopy;  Laterality: N/A;   COLONOSCOPY WITH PROPOFOL N/A 04/14/2020   Procedure: COLONOSCOPY WITH PROPOFOL;  Surgeon: Ronald Lobo, MD;  [facility];  Service: Endoscopy;  Laterality: N/A;   ESOPHAGOGASTRODUODENOSCOPY (EGD) WITH PROPOFOL N/A 04/14/2020   Procedure: ESOPHAGOGASTRODUODENOSCOPY (EGD) WITH PROPOFOL;  Surgeon: Ronald Lobo, MD;  [facility];  Service: Endoscopy;  Laterality: N/A;   eye biospy  1979   dx with leukemia from this biospy   eye biospy  1979   rt eye, dx: leukemia   EYE SURGERY  1982   rt eye   NASAL SINUS SURGERY  1999   PLANTAR FASCIA SURGERY  2004, 2012   rt foot, left foot   POLYPECTOMY  04/14/2020   Procedure: POLYPECTOMY;  Surgeon: Ronald Lobo, MD;  [facility];  Service: Endoscopy;;   RADICAL HYSTERECTOMY     SCLEROTHERAPY  04/14/2020   Procedure: Clide Deutscher;  Surgeon: Ronald Lobo, MD;  [facility];  Service: Endoscopy;;   skin cancer removal on shoulder  2/14   THYROID LOBECTOMY Left 09/25/2013   Procedure: LEFT THYROID LOBECTOMY;  Surgeon: Earnstine Regal, MD;  [facility];  Service: General;  Laterality: Left;   Social History   Occupational History   Not on file  Tobacco Use   Smoking status: Former    Types: Cigarettes    Quit date: 09/15/1991    Years since quitting: 29.6   Smokeless tobacco: Never  Vaping Use   Vaping Use: Never used  Substance and  Sexual Activity   Alcohol use: Yes    Alcohol/week: 0.0 standard drinks    Comment: rarely   Drug use: No   Sexual activity: Never    Partners: Male    Birth control/protection: Surgical    Comment: hyst.....Marland KitchenINTERCOURSE AGE 87, SEXUAL PARTNERS LESS THAN 5

## 2021-05-10 ENCOUNTER — Telehealth: Payer: Self-pay | Admitting: Family Medicine

## 2021-05-10 LAB — CBC WITH DIFFERENTIAL/PLATELET
Absolute Monocytes: 454 cells/uL (ref 200–950)
Basophils Absolute: 22 cells/uL (ref 0–200)
Basophils Relative: 0.4 %
Eosinophils Absolute: 90 cells/uL (ref 15–500)
Eosinophils Relative: 1.6 %
HCT: 39.2 % (ref 35.0–45.0)
Hemoglobin: 12.8 g/dL (ref 11.7–15.5)
Lymphs Abs: 1450 cells/uL (ref 850–3900)
MCH: 28.7 pg (ref 27.0–33.0)
MCHC: 32.7 g/dL (ref 32.0–36.0)
MCV: 87.9 fL (ref 80.0–100.0)
MPV: 12.2 fL (ref 7.5–12.5)
Monocytes Relative: 8.1 %
Neutro Abs: 3584 cells/uL (ref 1500–7800)
Neutrophils Relative %: 64 %
Platelets: 101 10*3/uL — ABNORMAL LOW (ref 140–400)
RBC: 4.46 10*6/uL (ref 3.80–5.10)
RDW: 13.9 % (ref 11.0–15.0)
Total Lymphocyte: 25.9 %
WBC: 5.6 10*3/uL (ref 3.8–10.8)

## 2021-05-10 LAB — HEMOGLOBIN A1C
Hgb A1c MFr Bld: 5.2 % of total Hgb (ref <5.7)
Mean Plasma Glucose: 103 mg/dL
eAG (mmol/L): 5.7 mmol/L

## 2021-05-10 LAB — BASIC METABOLIC PANEL
BUN: 9 mg/dL (ref 7–25)
CO2: 30 mmol/L (ref 20–32)
Calcium: 9.1 mg/dL (ref 8.6–10.4)
Chloride: 102 mmol/L (ref 98–110)
Creat: 0.6 mg/dL (ref 0.50–1.03)
Glucose, Bld: 96 mg/dL (ref 65–99)
Potassium: 4.1 mmol/L (ref 3.5–5.3)
Sodium: 141 mmol/L (ref 135–146)

## 2021-05-10 LAB — VITAMIN B12: Vitamin B-12: 595 pg/mL (ref 200–1100)

## 2021-05-10 MED ORDER — PHENTERMINE HCL 37.5 MG PO TABS: 37.5000 mg | ORAL_TABLET | Freq: Every day | ORAL | 5 refills | Status: DC

## 2021-05-10 NOTE — Telephone Encounter (Signed)
Patient called advised she had to cut both of the bandages off because they were to tight. Patient said her leg started to turn a little puple on one of her legs. Patient said she do not want to wear the bandages any more. The number to contact patient is [phone]

## 2021-05-10 NOTE — Addendum Note (Signed)
Addended by: Hortencia Pilar on: 05/10/2021 04:31 PM   Modules accepted: Orders

## 2021-05-10 NOTE — Telephone Encounter (Signed)
Pt called back and states she forgot to ask you one last question about her medication if you could call her back.   CB [phone]

## 2021-05-10 NOTE — Telephone Encounter (Signed)
Labs look good.

## 2021-05-10 NOTE — Telephone Encounter (Signed)
Please advise 

## 2021-05-10 NOTE — Telephone Encounter (Signed)
The patient is about to get her last refill on Phentermine. She is asking for refills to last her until she sees Dr. Laurann Montana in November. [facility].

## 2021-05-10 NOTE — Telephone Encounter (Signed)
I called and advised the patient of this.

## 2021-05-12 ENCOUNTER — Other Ambulatory Visit: Payer: Self-pay | Admitting: Family Medicine

## 2021-05-12 DIAGNOSIS — Z1231 Encounter for screening mammogram for malignant neoplasm of breast: Secondary | ICD-10-CM

## 2021-05-17 ENCOUNTER — Telehealth: Payer: Self-pay | Admitting: Family Medicine

## 2021-05-17 NOTE — Telephone Encounter (Signed)
Noted  

## 2021-05-17 NOTE — Telephone Encounter (Signed)
Pt called to update that speaking to Dr. Junius Roads he recommends her to stay in office and be a patient of Dr. Marlou Sa. Pt states she would like to make Dr. Marlou Sa her doctor when ready for future appts. Pt phone number is [phone].

## 2021-05-22 ENCOUNTER — Other Ambulatory Visit: Payer: Self-pay | Admitting: Family Medicine

## 2021-06-08 ENCOUNTER — Ambulatory Visit (INDEPENDENT_AMBULATORY_CARE_PROVIDER_SITE_OTHER): Payer: Medicare HMO

## 2021-06-08 ENCOUNTER — Other Ambulatory Visit: Payer: Self-pay

## 2021-06-08 ENCOUNTER — Encounter: Payer: Self-pay | Admitting: Orthopaedic Surgery

## 2021-06-08 ENCOUNTER — Ambulatory Visit: Payer: Medicare HMO | Admitting: Orthopaedic Surgery

## 2021-06-08 DIAGNOSIS — G8929 Other chronic pain: Secondary | ICD-10-CM | POA: Diagnosis not present

## 2021-06-08 DIAGNOSIS — M545 Low back pain, unspecified: Secondary | ICD-10-CM | POA: Diagnosis not present

## 2021-06-08 DIAGNOSIS — M25552 Pain in left hip: Secondary | ICD-10-CM

## 2021-06-08 DIAGNOSIS — Z6841 Body Mass Index (BMI) 40.0 and over, adult: Secondary | ICD-10-CM

## 2021-06-08 DIAGNOSIS — M5442 Lumbago with sciatica, left side: Secondary | ICD-10-CM

## 2021-06-08 NOTE — Progress Notes (Signed)
Office Visit Note   Patient: Marissa [name]           Date of Birth: [birth year]           MRN: [id number] Visit Date: 06/08/2021              Requested by: Eunice Blase, MD No address on file PCP: Eunice Blase, MD   Assessment & Plan: Visit Diagnoses:  1. Pain in left hip   2. Chronic left-sided low back pain, unspecified whether sciatica present   3. Class 3 severe obesity due to excess calories without serious comorbidity with body mass index (BMI) of 50.0 to 59.9 in adult ([place])   4. Chronic left-sided low back pain with left-sided sciatica     Plan: [name] experienced insidious onset of low back pain and left buttock discomfort about a week ago.  She denies any history of injury or trauma.  Some pain has referred into her left leg.  She has a history of chronic back problems and had seen Dr. Orvilla Fus tach at 1 time and was given several "injections which were not very helpful".  She has been on Flexeril and tramadol.  She has seen Dr. Junius Roads in the past who is diagnosed some areas of avascular necrosis in various joints.  She is diabetic.  She does have a prescription for tramadol and Lyrica.  X-rays demonstrate some degenerative changes in the lumbar spine with no acute changes.  I did not see any evidence of a listhesis or compression fracture.  I think it is worth obtaining another CT scan she had one in 2018 revealing some degenerative changes of bulging disc at one of the levels to the left.  That scan was unchanged from an MRI scan that was performed in 2012.  Would not change any medicines in the meantime.  Follow-Up Instructions: Return After CT scan lumbar spine.   Orders:  Orders Placed This Encounter  Procedures   XR Lumbar Spine 2-3 Views   CT LUMBAR SPINE WO CONTRAST   No orders of the defined types were placed in this encounter.     Procedures: No procedures performed   Clinical Data: No additional findings.   Subjective: Chief Complaint  Patient  presents with   Left Hip - Pain  Patient presents today for left sided buttock and lower back pain. She said that it flared up last week. She thought it was improving, but has continued to hurt. She has some pain that travels into her left leg. She takes Flexeril and tramadol. She was a patient of Dr.Hilts.  Long history of chronic low back pain treated in the past by Dr. Orvilla Fus tach before she retired.  She has had cortisone injections in her back which did not provide much relief.  Pain does come and go.  On this occasion she had a exacerbation about a week ago with referred pain into her buttock and her left leg.  She had an MRI scan in 2012 and a CT scan of her lumbar spine in 2018 demonstrating a small to moderate extraforaminal disc at L3-4 its bulging to the left.  Studies were similar to each other despite being 6 years apart.  Been on tramadol and Lyrica.  She is been on disability for at least 4 to 5 years.  She also relates she is lost about 20 pounds recently  HPI  Review of Systems   Objective: Vital Signs: LMP 07/23/2002   Physical Exam Constitutional:  Appearance: She is well-developed.  Pulmonary:     Effort: Pulmonary effort is normal.  Skin:    General: Skin is warm and dry.  Neurological:     Mental Status: She is alert and oriented to person, place, and time.  Psychiatric:        Behavior: Behavior normal.    Ortho Exam awake alert comfortable sitting.  Very large abdominal panniculus when she sits her abdomen almost covers her knees.  She did have some discomfort with range of motion of both of her hips but more on the left than the right.  She has had films of her hips in the past demonstrating some arthritic change in her left hip but details were excreted by the size of her panniculus.  Straight leg raise was negative.  There was some mild percussible tenderness over the lumbar spine.  Motor exam appeared to be intact.  Exam is is is limited because of the  patient's size.  BMI is over 50  Specialty Comments:  No specialty comments available.  Imaging: XR Lumbar Spine 2-3 Views  Result Date: 06/08/2021 Films of the lumbar spine were obtained in 2 projections.  There is a very minimal degenerative scoliosis to the right.  There appears to be some decrease in the disc space height between L5 and S1 but no listhesis.  There are areas of sclerosis at the facet joints at L4-5 and L5-S1 consistent with rhinitis.  Films are low quality based on the very large panniculus    PMFS History: Patient Active Problem List   Diagnosis Date Noted   Low back pain 06/08/2021   Vitamin B12 deficiency 04/01/2020   Iron deficiency anemia 01/15/2020   Deficiency anemia 01/15/2020   Thrombocytopenia ([place]) 01/15/2020   Diabetes ([place]) 09/07/2019   Osteopenia of forearm 09/07/2019   Gastroesophageal reflux disease 05/08/2019   Hyperglycemia 05/08/2019   Urinary incontinence 05/08/2019   History of colon polyps 05/08/2019   Mild intermittent asthma without complication Q000111Q   BV (bacterial vaginosis) 01/01/2013   Anal fissure 07/07/2012   History of acute lymphoblastic leukemia (ALL) 01/21/2012   Endometriosis 01/21/2012   Class 3 severe obesity without serious comorbidity with body mass index (BMI) of 50.0 to 59.9 in adult ([place]) 11/28/2011   Multinodular goiter (nontoxic), dominant nodule left lobe 07/18/2011   Past Medical History:  Diagnosis Date   Abdominal pain    INTERMITANT   Anemia    Asthma    GERD (gastroesophageal reflux disease)    H/O hiatal hernia    Hemorrhoids    History of transfusion    IBS (irritable bowel syndrome)    Leukemia ([place])    as a child, acute lymphatic with T cell   Plantar fasciitis    Plantar fasciitis of left foot FALL 2012   HAD SURGERY TO FIX IT   Prediabetes    Rectal pain    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   Thyroid nodule     Family History  Problem Relation Age of Onset   Cancer Mother 65        LUNG   Diabetes Mother    Diabetes Father    Stroke Father    Heart disease Father    Vision loss Father        GLAUCOMA   Cancer Maternal Aunt        breast   Breast cancer Maternal Aunt    Diverticulosis Other    Colon cancer Neg  Hx     Past Surgical History:  Procedure Laterality Date   ABDOMINAL HYSTERECTOMY  2003   BSO   BIOPSY  04/14/2020   Procedure: BIOPSY;  Surgeon: Ronald Lobo, MD;  [facility];  Service: Endoscopy;;   BIOPSY THYROID  11/2009   benign   carpel tunnel  2004   rt hand   COLONOSCOPY WITH PROPOFOL N/A 11/08/2016   Procedure: COLONOSCOPY WITH PROPOFOL;  Surgeon: Ronald Lobo, MD;  [facility];  Service: Endoscopy;  Laterality: N/A;   COLONOSCOPY WITH PROPOFOL N/A 04/14/2020   Procedure: COLONOSCOPY WITH PROPOFOL;  Surgeon: Ronald Lobo, MD;  [facility];  Service: Endoscopy;  Laterality: N/A;   ESOPHAGOGASTRODUODENOSCOPY (EGD) WITH PROPOFOL N/A 04/14/2020   Procedure: ESOPHAGOGASTRODUODENOSCOPY (EGD) WITH PROPOFOL;  Surgeon: Ronald Lobo, MD;  [facility];  Service: Endoscopy;  Laterality: N/A;   eye biospy  1979   dx with leukemia from this biospy   eye biospy  1979   rt eye, dx: leukemia   EYE SURGERY  1982   rt eye   NASAL SINUS SURGERY  1999   PLANTAR FASCIA SURGERY  2004, 2012   rt foot, left foot   POLYPECTOMY  04/14/2020   Procedure: POLYPECTOMY;  Surgeon: Ronald Lobo, MD;  [facility];  Service: Endoscopy;;   RADICAL HYSTERECTOMY     SCLEROTHERAPY  04/14/2020   Procedure: Clide Deutscher;  Surgeon: Ronald Lobo, MD;  [facility];  Service: Endoscopy;;   skin cancer removal on shoulder  2/14   THYROID LOBECTOMY Left 09/25/2013   Procedure: LEFT THYROID LOBECTOMY;  Surgeon: Earnstine Regal, MD;  [facility];  Service: General;  Laterality: Left;   Social History   Occupational History   Not on file  Tobacco Use   Smoking status: Former    Types:  Cigarettes    Quit date: 09/15/1991    Years since quitting: 29.7   Smokeless tobacco: Never  Vaping Use   Vaping Use: Never used  Substance and Sexual Activity   Alcohol use: Yes    Alcohol/week: 0.0 standard drinks    Comment: rarely   Drug use: No   Sexual activity: Never    Partners: Male    Birth control/protection: Surgical    Comment: hyst.....Marland KitchenINTERCOURSE AGE 21, SEXUAL PARTNERS LESS THAN 5

## 2021-06-26 ENCOUNTER — Ambulatory Visit
Admission: RE | Admit: 2021-06-26 | Discharge: 2021-06-26 | Disposition: A | Discharge status: Home / Self Care | Payer: Medicare HMO | Source: Ambulatory Visit | Attending: Family Medicine | Admitting: Family Medicine

## 2021-06-26 ENCOUNTER — Other Ambulatory Visit: Payer: Self-pay

## 2021-06-26 DIAGNOSIS — Z1231 Encounter for screening mammogram for malignant neoplasm of breast: Secondary | ICD-10-CM

## 2021-06-27 ENCOUNTER — Ambulatory Visit
Admission: RE | Admit: 2021-06-27 | Discharge: 2021-06-27 | Disposition: A | Discharge status: Home / Self Care | Payer: Medicare HMO | Source: Ambulatory Visit | Attending: Orthopaedic Surgery | Admitting: Orthopaedic Surgery

## 2021-06-27 DIAGNOSIS — G8929 Other chronic pain: Secondary | ICD-10-CM

## 2021-06-27 DIAGNOSIS — M545 Low back pain, unspecified: Secondary | ICD-10-CM

## 2021-06-27 DIAGNOSIS — M25552 Pain in left hip: Secondary | ICD-10-CM

## 2021-06-29 ENCOUNTER — Encounter: Payer: Self-pay | Admitting: Orthopaedic Surgery

## 2021-06-29 ENCOUNTER — Other Ambulatory Visit: Payer: Self-pay

## 2021-06-29 ENCOUNTER — Ambulatory Visit: Payer: Medicare HMO | Admitting: Orthopaedic Surgery

## 2021-06-29 DIAGNOSIS — M545 Low back pain, unspecified: Secondary | ICD-10-CM

## 2021-06-29 DIAGNOSIS — G8929 Other chronic pain: Secondary | ICD-10-CM

## 2021-06-29 MED ORDER — TRAMADOL HCL 50 MG PO TABS: 50.0000 mg | ORAL_TABLET | Freq: Every evening | ORAL | 0 refills | Status: AC | PRN

## 2021-06-29 NOTE — Progress Notes (Signed)
Office Visit Note   Patient: Marissa [name]           Date of Birth: [birth year]           MRN: [id number] Visit Date: 06/29/2021              Requested by: Eunice Blase, MD [address], [place],  [place] [address] PCP: Eunice Blase, MD   Assessment & Plan: Visit Diagnoses:  1. Chronic left-sided low back pain, unspecified whether sciatica present     Plan: [name] had a CT scan of her lumbar spine that was compared to a prior CT scan of the spine in 2018 and an MRI scan of her spine from 2012.  She has mild noncompressive disc bulge at L2-3 that may have increased over time.  In addition there was chronic left foraminal to extraforaminal endplate osteophytes and disc bulging at L3-4.  This also appeared to be similar to previous studies.  At L4-5 there was disc bulging and worsened facet osteoarthritis.  There was mild stenosis of the lateral recesses.  This could be an area of pain.  She has had trouble for many many years.  I will prescribe tramadol for her pain and refer her to a pain clinic.  She has had some injections in the past that were not very helpful so she like to avoid those.  We will also try a course of physical therapy.  Her weight at 322 pounds is considerable and I am sure that may impact her back.  Follow-Up Instructions: Return if symptoms worsen or fail to improve.   Orders:  Orders Placed This Encounter  Procedures   Ambulatory referral to Pain Clinic   Ambulatory referral to Physical Therapy   Meds ordered this encounter  Medications   traMADol (ULTRAM) 50 MG tablet    Sig: Take 1 tablet (50 mg total) by mouth at bedtime as needed.    Dispense:  30 tablet    Refill:  0      Procedures: No procedures performed   Clinical Data: No additional findings.   Subjective: Chief Complaint  Patient presents with   Lower Back - Follow-up    CT review  Patient presents today for follow up on her lower back. She had a CT scan and is  here today for those results.  No change in symptoms  HPI  Review of Systems   Objective: Vital Signs: LMP 07/23/2002   Physical Exam Constitutional:      Appearance: She is well-developed.  Eyes:     Pupils: Pupils are equal, round, and reactive to light.  Pulmonary:     Effort: Pulmonary effort is normal.  Skin:    General: Skin is warm and dry.  Neurological:     Mental Status: She is alert and oriented to person, place, and time.  Psychiatric:        Behavior: Behavior normal.    Ortho Exam weight 322 pounds.  Extremely large abdominal panniculus.  No percussible tenderness of the lumbar spine.  Straight leg raise was negative.  Motor exam appears to be intact no obvious pain with internal or external rotation of either hip  Specialty Comments:  No specialty comments available.  Imaging: No results found.   PMFS History: Patient Active Problem List   Diagnosis Date Noted   Low back pain 06/08/2021   Vitamin B12 deficiency 04/01/2020   Iron deficiency anemia 01/15/2020   Deficiency anemia 01/15/2020  Thrombocytopenia ([place]) 01/15/2020   Diabetes ([place]) 09/07/2019   Osteopenia of forearm 09/07/2019   Gastroesophageal reflux disease 05/08/2019   Hyperglycemia 05/08/2019   Urinary incontinence 05/08/2019   History of colon polyps 05/08/2019   Mild intermittent asthma without complication 81/09/7508   BV (bacterial vaginosis) 01/01/2013   Anal fissure 07/07/2012   History of acute lymphoblastic leukemia (ALL) 01/21/2012   Endometriosis 01/21/2012   Class 3 severe obesity without serious comorbidity with body mass index (BMI) of 50.0 to 59.9 in adult ([place]) 11/28/2011   Multinodular goiter (nontoxic), dominant nodule left lobe 07/18/2011   Past Medical History:  Diagnosis Date   Abdominal pain    INTERMITANT   Anemia    Asthma    GERD (gastroesophageal reflux disease)    H/O hiatal hernia    Hemorrhoids    History of transfusion    IBS (irritable bowel  syndrome)    Leukemia ([place])    as a child, acute lymphatic with T cell   Plantar fasciitis    Plantar fasciitis of left foot FALL 2012   HAD SURGERY TO FIX IT   Prediabetes    Rectal pain    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   Thyroid nodule     Family History  Problem Relation Age of Onset   Cancer Mother 74       LUNG   Diabetes Mother    Diabetes Father    Stroke Father    Heart disease Father    Vision loss Father        GLAUCOMA   Cancer Maternal Aunt        breast   Breast cancer Maternal Aunt    Diverticulosis Other    Colon cancer Neg Hx     Past Surgical History:  Procedure Laterality Date   ABDOMINAL HYSTERECTOMY  2003   BSO   BIOPSY  04/14/2020   Procedure: BIOPSY;  Surgeon: Ronald Lobo, MD;  Location: [facility];  Service: Endoscopy;;   BIOPSY THYROID  11/2009   benign   carpel tunnel  2004   rt hand   COLONOSCOPY WITH PROPOFOL N/A 11/08/2016   Procedure: COLONOSCOPY WITH PROPOFOL;  Surgeon: Ronald Lobo, MD;  Location: [facility];  Service: Endoscopy;  Laterality: N/A;   COLONOSCOPY WITH PROPOFOL N/A 04/14/2020   Procedure: COLONOSCOPY WITH PROPOFOL;  Surgeon: Ronald Lobo, MD;  [facility]: [facility];  Service: Endoscopy;  Laterality: N/A;   ESOPHAGOGASTRODUODENOSCOPY (EGD) WITH PROPOFOL N/A 04/14/2020   Procedure: ESOPHAGOGASTRODUODENOSCOPY (EGD) WITH PROPOFOL;  Surgeon: Ronald Lobo, MD;  [facility]: [facility];  Service: Endoscopy;  Laterality: N/A;   eye biospy  1979   dx with leukemia from this biospy   eye biospy  1979   rt eye, dx: leukemia   EYE SURGERY  1982   rt eye   NASAL SINUS SURGERY  1999   PLANTAR FASCIA SURGERY  2004, 2012   rt foot, left foot   POLYPECTOMY  04/14/2020   Procedure: POLYPECTOMY;  Surgeon: Ronald Lobo, MD;  [facility]: [facility];  Service: Endoscopy;;   RADICAL HYSTERECTOMY     SCLEROTHERAPY  04/14/2020   Procedure: Clide Deutscher;  Surgeon: Ronald Lobo, MD;  Location: [facility];  Service:  Endoscopy;;   skin cancer removal on shoulder  2/14   THYROID LOBECTOMY Left 09/25/2013   Procedure: LEFT THYROID LOBECTOMY;  Surgeon: Earnstine Regal, MD;  Location: [facility];  Service: General;  Laterality: Left;   Social History   Occupational History  Not on file  Tobacco Use   Smoking status: Former    Types: Cigarettes    Quit date: 09/15/1991    Years since quitting: 29.8   Smokeless tobacco: Never  Vaping Use   Vaping Use: Never used  Substance and Sexual Activity   Alcohol use: Yes    Alcohol/week: 0.0 standard drinks    Comment: rarely   Drug use: No   Sexual activity: Never    Partners: Male    Birth control/protection: Surgical    Comment: hyst.....Marland KitchenINTERCOURSE AGE 10, SEXUAL PARTNERS LESS THAN 5

## 2021-07-03 ENCOUNTER — Ambulatory Visit: Payer: Medicare HMO | Admitting: Rehabilitative and Restorative Service Providers"

## 2021-07-03 DIAGNOSIS — Z85828 Personal history of other malignant neoplasm of skin: Secondary | ICD-10-CM | POA: Diagnosis not present

## 2021-07-03 DIAGNOSIS — I872 Venous insufficiency (chronic) (peripheral): Secondary | ICD-10-CM | POA: Diagnosis not present

## 2021-07-03 DIAGNOSIS — I8311 Varicose veins of right lower extremity with inflammation: Secondary | ICD-10-CM | POA: Diagnosis not present

## 2021-07-03 DIAGNOSIS — L821 Other seborrheic keratosis: Secondary | ICD-10-CM | POA: Diagnosis not present

## 2021-07-03 DIAGNOSIS — L570 Actinic keratosis: Secondary | ICD-10-CM | POA: Diagnosis not present

## 2021-07-03 DIAGNOSIS — D1801 Hemangioma of skin and subcutaneous tissue: Secondary | ICD-10-CM | POA: Diagnosis not present

## 2021-07-03 DIAGNOSIS — I8312 Varicose veins of left lower extremity with inflammation: Secondary | ICD-10-CM | POA: Diagnosis not present

## 2021-07-03 DIAGNOSIS — L814 Other melanin hyperpigmentation: Secondary | ICD-10-CM | POA: Diagnosis not present

## 2021-07-05 ENCOUNTER — Ambulatory Visit: Payer: Medicare HMO | Admitting: Rehabilitative and Restorative Service Providers"

## 2021-07-05 ENCOUNTER — Other Ambulatory Visit: Payer: Self-pay

## 2021-07-05 ENCOUNTER — Encounter: Payer: Self-pay | Admitting: Rehabilitative and Restorative Service Providers"

## 2021-07-05 DIAGNOSIS — R262 Difficulty in walking, not elsewhere classified: Secondary | ICD-10-CM

## 2021-07-05 DIAGNOSIS — M5442 Lumbago with sciatica, left side: Secondary | ICD-10-CM

## 2021-07-05 DIAGNOSIS — M5441 Lumbago with sciatica, right side: Secondary | ICD-10-CM | POA: Diagnosis not present

## 2021-07-05 DIAGNOSIS — R293 Abnormal posture: Secondary | ICD-10-CM

## 2021-07-05 DIAGNOSIS — M6281 Muscle weakness (generalized): Secondary | ICD-10-CM

## 2021-07-05 DIAGNOSIS — G8929 Other chronic pain: Secondary | ICD-10-CM

## 2021-07-05 NOTE — Patient Instructions (Signed)
Access Code: [email or web address] URL: [email or web address] Date: 07/05/2021 Prepared by: Scot Jun  Exercises Seated Scapular Retraction - 2 x daily - 7 x weekly - 10 reps - 2 sets - 5 hold Sit to Stand - 2 x daily - 7 x weekly - 3 sets - 10 reps

## 2021-07-05 NOTE — Therapy (Addendum)
[facility] [address] [place], [place], [address] Phone: [phone]   Fax:  [phone]  Physical Therapy Evaluation  Patient Details  Name: DELISSA SILBA MRN: [id number] Date of Birth: November 28, 1965 Referring Provider (PT): Garald Balding, MD   Encounter Date: 07/05/2021   PT End of Session - 07/05/21 1344     Visit Number 1    Number of Visits 20    Date for PT Re-Evaluation 09/13/21    Authorization Type [employer] Medicare $35 copay    Progress Note Due on Visit 10    PT Start Time 1345    PT Stop Time 1425    PT Time Calculation (min) 40 min    Activity Tolerance Patient limited by pain    Behavior During Therapy [facility] for tasks assessed/performed             Past Medical History:  Diagnosis Date   Abdominal pain    INTERMITANT   Anemia    Asthma    GERD (gastroesophageal reflux disease)    H/O hiatal hernia    Hemorrhoids    History of transfusion    IBS (irritable bowel syndrome)    Leukemia ([place])    as a child, acute lymphatic with T cell   Plantar fasciitis    Plantar fasciitis of left foot FALL 2012   HAD SURGERY TO FIX IT   Prediabetes    Rectal pain    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   Thyroid nodule     Past Surgical History:  Procedure Laterality Date   ABDOMINAL HYSTERECTOMY  2003   BSO   BIOPSY  04/14/2020   Procedure: BIOPSY;  Surgeon: Ronald Lobo, MD;  [facility]: [facility];  Service: Endoscopy;;   BIOPSY THYROID  11/2009   benign   carpel tunnel  2004   rt hand   COLONOSCOPY WITH PROPOFOL N/A 11/08/2016   Procedure: COLONOSCOPY WITH PROPOFOL;  Surgeon: Ronald Lobo, MD;  Location: [facility];  Service: Endoscopy;  Laterality: N/A;   COLONOSCOPY WITH PROPOFOL N/A 04/14/2020   Procedure: COLONOSCOPY WITH PROPOFOL;  Surgeon: Ronald Lobo, MD;  [facility];  Service: Endoscopy;  Laterality: N/A;   ESOPHAGOGASTRODUODENOSCOPY (EGD) WITH PROPOFOL N/A 04/14/2020   Procedure:  ESOPHAGOGASTRODUODENOSCOPY (EGD) WITH PROPOFOL;  Surgeon: Ronald Lobo, MD;  [facility]: [facility];  Service: Endoscopy;  Laterality: N/A;   eye biospy  1979   dx with leukemia from this biospy   eye biospy  1979   rt eye, dx: leukemia   EYE SURGERY  1982   rt eye   NASAL SINUS SURGERY  1999   PLANTAR FASCIA SURGERY  2004, 2012   rt foot, left foot   POLYPECTOMY  04/14/2020   Procedure: POLYPECTOMY;  Surgeon: Ronald Lobo, MD;  Location: [facility];  Service: Endoscopy;;   RADICAL HYSTERECTOMY     SCLEROTHERAPY  04/14/2020   Procedure: Clide Deutscher;  Surgeon: Ronald Lobo, MD;  Location: [facility];  Service: Endoscopy;;   skin cancer removal on shoulder  2/14   THYROID LOBECTOMY Left 09/25/2013   Procedure: LEFT THYROID LOBECTOMY;  Surgeon: Earnstine Regal, MD;  Location: [facility];  Service: General;  Laterality: Left;    There were no vitals filed for this visit.    Subjective Assessment - 07/05/21 1348     Subjective Pt. came to clinic c complaints of lower back pain c radiating pain in bilateral hips/thighs and into groin.  Also can notice some complaints  into feet numb at times.  Pt. indicated history of edema bilateral LE.  Pt. indicated difficulty c standing/walking causing spasms and pain.  Pt. indicated trouble for long periods of time but worsened in last 2-3 years.    Pertinent History CT scan, mild noncompressive disc buldge L2-L3, injections in past that weren't helpful  PMH: DM, urinary incontinence, obesity    Limitations Standing;Walking;House hold activities;Lifting    How long can you stand comfortably? 15 mins    How long can you walk comfortably? 15 mins    Diagnostic tests CT Scan    Patient Stated Goals Reduce pain    Currently in Pain? Yes    Pain Score 7    at worst : 10/10   Pain Location Back   bilateral lower extremity   Pain Descriptors / Indicators Constant;Tingling;Spasm    Pain Type Chronic pain    Pain Radiating Towards bilateral LE,  numbness in feet at times    Pain Onset More than a month ago    Pain Frequency Constant    Aggravating Factors  prolonged standing/walking, prolonged sitting, pivoting/twisting    Pain Relieving Factors sitting after walking, heating pad sometimes                [facility] PT Assessment - 07/05/21 0001       Assessment   Medical Diagnosis M54.50,G89.29 (ICD-10-CM) - Chronic left-sided low back pain, unspecified whether sciatica present    Referring Provider (PT) Garald Balding, MD    Onset Date/Surgical Date 06/25/19   approx. 2 years of worsening   Hand Dominance Right      Precautions   Precautions None      Balance Screen   Has the patient fallen in the past 6 months No    Has the patient had a decrease in activity level because of a fear of falling?  No    Is the patient reluctant to leave their home because of a fear of falling?  No      Home Environment   Additional Comments 2 steps to enter, has to have railing 5 steps for deck      Prior Function   Vocation On disability    Leisure Reading, trying to do exercise for losing weight, housework activity      Observation/Other Assessments   Focus on Therapeutic Outcomes (FOTO)  intake 34%, predicted 48%      Functional Tests   Functional tests Sit to Stand      Sit to Stand   Comments Able to perform from 18 inch chair s UE assist on 1st try      Posture/Postural Control   Posture/Postural Control Postural limitations    Postural Limitations Rounded Shoulders;Forward head;Increased thoracic kyphosis;Increased lumbar lordosis      ROM / Strength   AROM / PROM / Strength Strength;PROM;AROM      AROM   Overall AROM Comments Starting symptoms at rest: mid to lower lumbar 4/10. see below for movement pain response.  Plan to check hip mobility at later date due to progressive mobility difficulties    AROM Assessment Site Lumbar    Lumbar Flexion to mid thigh c pain in lumbar, no LE worsening    Lumbar Extension  50% end range pain lumbar, produced complaints in bilateral hip and groin      PROM   PROM Assessment Site Hip;Knee;Ankle    Right/Left Hip Right;Left    Right/Left Knee Right;Left    Right/Left Ankle  Left;Right      Strength   Overall Strength Comments Dermatomal screening unremarkable today    Strength Assessment Site Hip;Knee;Ankle    Right/Left Hip Right;Left    Right Hip Flexion 4/5    Left Hip Flexion 4/5    Right/Left Knee Right;Left    Right Knee Flexion 5/5    Right Knee Extension 5/5    Left Knee Flexion 5/5    Left Knee Extension 5/5    Right/Left Ankle Left;Right    Right Ankle Dorsiflexion 5/5    Left Ankle Dorsiflexion 5/5      Palpation   Palpation comment Light touch sensitivity throughout mid to lower lumbar region centrally and paraspinal region of lumbar      Special Tests   Other special tests (-) Slump testing for radicular symptom production but (+) production of lumbar pain c movement.  Also (+) lumbar pain noted c passive SLR Lt and Rt to 40 degrees.  Negative radicular symptoms noted c crossed SLR bilateral today      Ambulation/Gait   Gait Pattern Decreased stride length;Antalgic;Trendelenburg;Wide base of support                        Objective measurements completed on examination: See above findings.       [facility] Adult PT Treatment/Exercise - 07/05/21 0001       Neuro Re-ed    Neuro Re-ed Details  Time spent in education regarding central sensitization c verbal cues given explaining normal pain response vs. elevated symptom response (house alarm description of normal vs. elevated response - leaf on porch).  Educaiton given on benefits of Pt. education regarding concept of pain neuroscience for helping reduced sensitivity as well as role of progressive exercise, aerobic interventions going forward.      Exercises   Exercises Other Exercises;Lumbar    Other Exercises  HEP instruction/performance c cues for techniques, handout  provided.  Trial set performed of each for comprehension and symptom assessment.  HEP consisting of sit to stand, scapular retractions for mobility                     PT Education - 07/05/21 1426     Education Details HEP, POC,  central sensitization education    Person(s) Educated Patient    Methods Explanation;Demonstration;Verbal cues;Handout    Comprehension Verbalized understanding;Returned demonstration              PT Short Term Goals - 07/05/21 1344       PT SHORT TERM GOAL #1   Title Pt will be I and compliant with HEP.    Time 3    Period Weeks    Status New    Target Date 07/26/21               PT Long Term Goals - 07/05/21 1345       PT LONG TERM GOAL #1   Title Patient will demonstrate/report pain at worst less than or equal to 4/10 to facilitate minimal limitation in daily activity secondary to pain symptoms.    Time 10    Period Weeks    Status New    Target Date 09/13/21      PT LONG TERM GOAL #2   Title Patient will demonstrate independent use of home exercise program to facilitate ability to maintain/progress functional gains from skilled physical therapy services.    Time 10    Period Weeks  Status New    Target Date 09/13/21      PT LONG TERM GOAL #3   Title Pt. will demonstrate FOTO outcome > or = 48% to indicated reduced disability due to condition.    Time 10    Period Weeks    Status New    Target Date 09/13/21      PT LONG TERM GOAL #4   Title Pt. will demonstrate/report ability to walk > 30 mins to facilitate community ambulation, exercise desires.    Time 10    Period Weeks    Status New    Target Date 09/13/21      PT LONG TERM GOAL #5   Title Pt. will demonstrate lumbar ext > or = 75% WFL to facilitate upright standing, walking posture.    Time 10    Period Weeks    Status New    Target Date 09/13/21                    Plan - 07/05/21 1419     Clinical Impression Statement Patient is a 55  y.o. who comes to clinic with complaints of low back pain c bilateral leg pain with mobility, strength and movement coordination deficits that impair their ability to perform usual daily and recreational functional activities without increase difficulty/symptoms at this time.  Patient to benefit from skilled PT services to address impairments and limitations to improve to previous level of function without restriction secondary to condition.    Personal Factors and Comorbidities Comorbidity 3+    Comorbidities DM, history of urinary incontinence, obesity    Examination-Activity Limitations Sit;Sleep;Bed Mobility;Squat;Bend;Stairs;Carry;Stand;Transfers;Lift;Locomotion Level    Examination-Participation Restrictions Cleaning;Shop;Community Activity;Interpersonal Relationship;Laundry;Meal Prep    Stability/Clinical Decision Making Evolving/Moderate complexity    Clinical Decision Making Moderate    Rehab Potential Fair    PT Frequency 2x / week    PT Duration Other (comment)   10 weeks   PT Treatment/Interventions ADLs/Self Care Home Management;Cryotherapy;Electrical Stimulation;Iontophoresis 4mg /ml Dexamethasone;Moist Heat;Traction;Balance training;Therapeutic exercise;Therapeutic activities;Functional mobility training;Stair training;Gait training;DME Instruction;Ultrasound;Neuromuscular re-education;Patient/family education;Passive range of motion;Spinal Manipulations;Joint Manipulations;Dry needling;Taping;Manual techniques    PT Next Visit Plan Progressive execise for neural desensitization and progress strengthening/endurance.  Lumbar mobility gains (recheck any centralization/peripherialization direction preference).    PT Home Exercise Plan [id number]    Consulted and Agree with Plan of Care Patient             Patient will benefit from skilled therapeutic intervention in order to improve the following deficits and impairments:  Abnormal gait, Decreased endurance, Hypomobility, Decreased  activity tolerance, Decreased strength, Pain, Difficulty walking, Decreased mobility, Decreased range of motion, Impaired perceived functional ability, Improper body mechanics, Postural dysfunction, Impaired flexibility  Visit Diagnosis: Chronic bilateral low back pain with bilateral sciatica - Plan: PT plan of care cert/re-cert  Muscle weakness (generalized) - Plan: PT plan of care cert/re-cert  Difficulty in walking, not elsewhere classified - Plan: PT plan of care cert/re-cert  Abnormal posture - Plan: PT plan of care cert/re-cert     Problem List Patient Active Problem List   Diagnosis Date Noted   Low back pain 06/08/2021   Vitamin B12 deficiency 04/01/2020   Iron deficiency anemia 01/15/2020   Deficiency anemia 01/15/2020   Thrombocytopenia ([place]) 01/15/2020   Diabetes ([place]) 09/07/2019   Osteopenia of forearm 09/07/2019   Gastroesophageal reflux disease 05/08/2019   Hyperglycemia 05/08/2019   Urinary incontinence 05/08/2019   History of colon polyps 05/08/2019   Mild intermittent asthma without  complication 37/94/4461   BV (bacterial vaginosis) 01/01/2013   Anal fissure 07/07/2012   History of acute lymphoblastic leukemia (ALL) 01/21/2012   Endometriosis 01/21/2012   Class 3 severe obesity without serious comorbidity with body mass index (BMI) of 50.0 to 59.9 in adult [facility]) 11/28/2011   Multinodular goiter (nontoxic), dominant nodule left lobe 07/18/2011   Scot Jun, PT, DPT, OCS, ATC 07/05/21  2:40 PM    [facility] [address] [place], [place], [address] Phone: [phone]   Fax:  [phone]  Name: RAYLAN TROIANI MRN: [id number] Date of Birth: Mar 25, 1966

## 2021-07-08 DIAGNOSIS — Z23 Encounter for immunization: Secondary | ICD-10-CM | POA: Diagnosis not present

## 2021-07-10 ENCOUNTER — Other Ambulatory Visit: Payer: Self-pay

## 2021-07-10 ENCOUNTER — Encounter: Payer: Self-pay | Admitting: Rehabilitative and Restorative Service Providers"

## 2021-07-10 ENCOUNTER — Encounter: Payer: Self-pay | Admitting: Physical Medicine & Rehabilitation

## 2021-07-10 ENCOUNTER — Ambulatory Visit: Payer: Medicare HMO | Admitting: Rehabilitative and Restorative Service Providers"

## 2021-07-10 DIAGNOSIS — M5442 Lumbago with sciatica, left side: Secondary | ICD-10-CM | POA: Diagnosis not present

## 2021-07-10 DIAGNOSIS — R293 Abnormal posture: Secondary | ICD-10-CM | POA: Diagnosis not present

## 2021-07-10 DIAGNOSIS — M5441 Lumbago with sciatica, right side: Secondary | ICD-10-CM | POA: Diagnosis not present

## 2021-07-10 DIAGNOSIS — R262 Difficulty in walking, not elsewhere classified: Secondary | ICD-10-CM

## 2021-07-10 DIAGNOSIS — G8929 Other chronic pain: Secondary | ICD-10-CM

## 2021-07-10 DIAGNOSIS — M6281 Muscle weakness (generalized): Secondary | ICD-10-CM

## 2021-07-10 NOTE — Therapy (Signed)
[facility] [address] [place], [place], [address] Phone: [phone]   Fax:  [phone]  Physical Therapy Treatment  Patient Details  Name: Marissa [name] MRN: [id number] Date of Birth: 1966/06/02 Referring Provider (PT): Garald Balding, MD   Encounter Date: 07/10/2021   PT End of Session - 07/10/21 1144     Visit Number 2    Number of Visits 20    Date for PT Re-Evaluation 09/13/21    Authorization Type [employer] Medicare $35 copay    Progress Note Due on Visit 10    PT Start Time 1140    PT Stop Time 1207    PT Time Calculation (min) 27 min    Activity Tolerance Patient limited by pain   limited treatment today due to symptoms   Behavior During Therapy [facility] for tasks assessed/performed             Past Medical History:  Diagnosis Date   Abdominal pain    INTERMITANT   Anemia    Asthma    GERD (gastroesophageal reflux disease)    H/O hiatal hernia    Hemorrhoids    History of transfusion    IBS (irritable bowel syndrome)    Leukemia ([place])    as a child, acute lymphatic with T cell   Plantar fasciitis    Plantar fasciitis of left foot FALL 2012   HAD SURGERY TO FIX IT   Prediabetes    Rectal pain    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   Thyroid nodule     Past Surgical History:  Procedure Laterality Date   ABDOMINAL HYSTERECTOMY  2003   BSO   BIOPSY  04/14/2020   Procedure: BIOPSY;  Surgeon: Ronald Lobo, MD;  Location: [facility];  Service: Endoscopy;;   BIOPSY THYROID  11/2009   benign   carpel tunnel  2004   rt hand   COLONOSCOPY WITH PROPOFOL N/A 11/08/2016   Procedure: COLONOSCOPY WITH PROPOFOL;  Surgeon: Ronald Lobo, MD;  Location: [facility];  Service: Endoscopy;  Laterality: N/A;   COLONOSCOPY WITH PROPOFOL N/A 04/14/2020   Procedure: COLONOSCOPY WITH PROPOFOL;  Surgeon: Ronald Lobo, MD;  Location: [facility];  Service: Endoscopy;  Laterality: N/A;   ESOPHAGOGASTRODUODENOSCOPY (EGD)  WITH PROPOFOL N/A 04/14/2020   Procedure: ESOPHAGOGASTRODUODENOSCOPY (EGD) WITH PROPOFOL;  Surgeon: Ronald Lobo, MD;  [facility]: [facility];  Service: Endoscopy;  Laterality: N/A;   eye biospy  1979   dx with leukemia from this biospy   eye biospy  1979   rt eye, dx: leukemia   EYE SURGERY  1982   rt eye   NASAL SINUS SURGERY  1999   PLANTAR FASCIA SURGERY  2004, 2012   rt foot, left foot   POLYPECTOMY  04/14/2020   Procedure: POLYPECTOMY;  Surgeon: Ronald Lobo, MD;  [facility]: [facility];  Service: Endoscopy;;   RADICAL HYSTERECTOMY     SCLEROTHERAPY  04/14/2020   Procedure: Clide Deutscher;  Surgeon: Ronald Lobo, MD;  Location: [facility];  Service: Endoscopy;;   skin cancer removal on shoulder  2/14   THYROID LOBECTOMY Left 09/25/2013   Procedure: LEFT THYROID LOBECTOMY;  Surgeon: Earnstine Regal, MD;  Location: [facility];  Service: General;  Laterality: Left;    There were no vitals filed for this visit.   Subjective Assessment - 07/10/21 1144     Subjective Pt. indicated pain in hip and knee were troublesome, 7/10 or so today in areas.  Pt. indicated swelling  occurred in Rt knee mainly.    Pertinent History CT scan, mild noncompressive disc buldge L2-L3, injections in past that weren't helpful  PMH: DM, urinary incontinence, obesity    Limitations Standing;Walking;House hold activities;Lifting    Diagnostic tests CT Scan    Patient Stated Goals Reduce pain    Pain Score 4     Pain Location Back    Pain Descriptors / Indicators Constant;Tingling;Spasm    Pain Type Chronic pain    Pain Onset More than a month ago    Pain Frequency Constant    Aggravating Factors  standing, walking.  transfers hurt knee    Pain Relieving Factors rest                               [facility] Adult PT Treatment/Exercise - 07/10/21 0001       Lumbar Exercises: Aerobic   Nustep Lvl 4 UE/LE (self selected pace due to symptoms in legs)      Lumbar Exercises: Seated    Long Arc Quad on Chair Both;2 sets;10 reps   2-3 second hold in full extension for quad set hold   Other Seated Lumbar Exercises seated pball press down 5 sec hold x 10 c bilateral upper extremity, seated soccer ball hip add isometrics 5 sec x 10                       PT Short Term Goals - 07/10/21 1204       PT SHORT TERM GOAL #1   Title Pt will be I and compliant with HEP.    Time 3    Period Weeks    Status On-going    Target Date 07/26/21               PT Long Term Goals - 07/05/21 1345       PT LONG TERM GOAL #1   Title Patient will demonstrate/report pain at worst less than or equal to 4/10 to facilitate minimal limitation in daily activity secondary to pain symptoms.    Time 10    Period Weeks    Status New    Target Date 09/13/21      PT LONG TERM GOAL #2   Title Patient will demonstrate independent use of home exercise program to facilitate ability to maintain/progress functional gains from skilled physical therapy services.    Time 10    Period Weeks    Status New    Target Date 09/13/21      PT LONG TERM GOAL #3   Title Pt. will demonstrate FOTO outcome > or = 48% to indicated reduced disability due to condition.    Time 10    Period Weeks    Status New    Target Date 09/13/21      PT LONG TERM GOAL #4   Title Pt. will demonstrate/report ability to walk > 30 mins to facilitate community ambulation, exercise desires.    Time 10    Period Weeks    Status New    Target Date 09/13/21      PT LONG TERM GOAL #5   Title Pt. will demonstrate lumbar ext > or = 75% WFL to facilitate upright standing, walking posture.    Time 10    Period Weeks    Status New    Target Date 09/13/21  Plan - 07/10/21 1147     Clinical Impression Statement Positiional tolerance troubles related to symptoms provided limitation in ability to perform various positionings (WB activity, lying down).  Adapted intervention as best as  possible to encourage general mobility as well as lumbar/hip strengthening.  Focus moving forward on increased strength through core, hip, lower extremity strengthening.    Personal Factors and Comorbidities Comorbidity 3+    Comorbidities DM, history of urinary incontinence, obesity    Examination-Activity Limitations Sit;Sleep;Bed Mobility;Squat;Bend;Stairs;Carry;Stand;Transfers;Lift;Locomotion Level    Examination-Participation Restrictions Cleaning;Shop;Community Activity;Interpersonal Relationship;Laundry;Meal Prep    Stability/Clinical Decision Making Evolving/Moderate complexity    Rehab Potential Fair    PT Frequency 2x / week    PT Duration Other (comment)   10 weeks   PT Treatment/Interventions ADLs/Self Care Home Management;Cryotherapy;Electrical Stimulation;Iontophoresis 4mg /ml Dexamethasone;Moist Heat;Traction;Balance training;Therapeutic exercise;Therapeutic activities;Functional mobility training;Stair training;Gait training;DME Instruction;Ultrasound;Neuromuscular re-education;Patient/family education;Passive range of motion;Spinal Manipulations;Joint Manipulations;Dry needling;Taping;Manual techniques    PT Next Visit Plan Progressive execise for neural desensitization and progress strengthening/endurance for core/LE  - doesn't do well c lying down activity or WB at this time.    PT Home Exercise Plan [id number]    Consulted and Agree with Plan of Care Patient             Patient will benefit from skilled therapeutic intervention in order to improve the following deficits and impairments:  Abnormal gait, Decreased endurance, Hypomobility, Decreased activity tolerance, Decreased strength, Pain, Difficulty walking, Decreased mobility, Decreased range of motion, Impaired perceived functional ability, Improper body mechanics, Postural dysfunction, Impaired flexibility  Visit Diagnosis: Chronic bilateral low back pain with bilateral sciatica  Muscle weakness  (generalized)  Difficulty in walking, not elsewhere classified  Abnormal posture     Problem List Patient Active Problem List   Diagnosis Date Noted   Low back pain 06/08/2021   Vitamin B12 deficiency 04/01/2020   Iron deficiency anemia 01/15/2020   Deficiency anemia 01/15/2020   Thrombocytopenia ([place]) 01/15/2020   Diabetes ([place]) 09/07/2019   Osteopenia of forearm 09/07/2019   Gastroesophageal reflux disease 05/08/2019   Hyperglycemia 05/08/2019   Urinary incontinence 05/08/2019   History of colon polyps 05/08/2019   Mild intermittent asthma without complication 54/05/8118   BV (bacterial vaginosis) 01/01/2013   Anal fissure 07/07/2012   History of acute lymphoblastic leukemia (ALL) 01/21/2012   Endometriosis 01/21/2012   Class 3 severe obesity without serious comorbidity with body mass index (BMI) of 50.0 to 59.9 in adult ([place]) 11/28/2011   Multinodular goiter (nontoxic), dominant nodule left lobe 07/18/2011    Scot Jun, PT, DPT, OCS, ATC 07/10/21  12:10 PM    [facility] [address] [place], [place], [address] Phone: [phone]   Fax:  [phone]  Name: Marissa [name] MRN: [id number] Date of Birth: 1966-05-18

## 2021-07-11 DIAGNOSIS — H52203 Unspecified astigmatism, bilateral: Secondary | ICD-10-CM | POA: Diagnosis not present

## 2021-07-11 DIAGNOSIS — E119 Type 2 diabetes mellitus without complications: Secondary | ICD-10-CM | POA: Diagnosis not present

## 2021-07-17 ENCOUNTER — Encounter: Payer: Medicare HMO | Admitting: Rehabilitative and Restorative Service Providers"

## 2021-07-19 ENCOUNTER — Encounter: Payer: Self-pay | Admitting: Rehabilitative and Restorative Service Providers"

## 2021-07-19 ENCOUNTER — Ambulatory Visit: Payer: Medicare HMO | Admitting: Rehabilitative and Restorative Service Providers"

## 2021-07-19 ENCOUNTER — Other Ambulatory Visit: Payer: Self-pay

## 2021-07-19 DIAGNOSIS — G8929 Other chronic pain: Secondary | ICD-10-CM | POA: Diagnosis not present

## 2021-07-19 DIAGNOSIS — R293 Abnormal posture: Secondary | ICD-10-CM | POA: Diagnosis not present

## 2021-07-19 DIAGNOSIS — R262 Difficulty in walking, not elsewhere classified: Secondary | ICD-10-CM

## 2021-07-19 DIAGNOSIS — M6281 Muscle weakness (generalized): Secondary | ICD-10-CM | POA: Diagnosis not present

## 2021-07-19 DIAGNOSIS — M5441 Lumbago with sciatica, right side: Secondary | ICD-10-CM | POA: Diagnosis not present

## 2021-07-19 DIAGNOSIS — M5442 Lumbago with sciatica, left side: Secondary | ICD-10-CM | POA: Diagnosis not present

## 2021-07-19 NOTE — Therapy (Signed)
[facility] [address] [place], [place], [address] Phone: [phone]   Fax:  [phone]  Physical Therapy Treatment  Patient Details  Name: Marissa [name] MRN: [id number] Date of Birth: [birth year] Referring Provider (PT): Garald Balding, MD   Encounter Date: 07/19/2021   PT End of Session - 07/19/21 1444     Visit Number 3    Number of Visits 20    Date for PT Re-Evaluation 09/13/21    Authorization Type [employer] Medicare $35 copay    Progress Note Due on Visit 10    PT Start Time 1432    PT Stop Time 1511    PT Time Calculation (min) 39 min    Activity Tolerance Patient limited by pain   limited treatment today due to symptoms   Behavior During Therapy [facility] for tasks assessed/performed             Past Medical History:  Diagnosis Date   Abdominal pain    INTERMITANT   Anemia    Asthma    GERD (gastroesophageal reflux disease)    H/O hiatal hernia    Hemorrhoids    History of transfusion    IBS (irritable bowel syndrome)    Leukemia ([place])    as a child, acute lymphatic with T cell   Plantar fasciitis    Plantar fasciitis of left foot FALL 2012   HAD SURGERY TO FIX IT   Prediabetes    Rectal pain    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   Thyroid nodule     Past Surgical History:  Procedure Laterality Date   ABDOMINAL HYSTERECTOMY  2003   BSO   BIOPSY  04/14/2020   Procedure: BIOPSY;  Surgeon: Ronald Lobo, MD;  [facility];  Service: Endoscopy;;   BIOPSY THYROID  11/2009   benign   carpel tunnel  2004   rt hand   COLONOSCOPY WITH PROPOFOL N/A 11/08/2016   Procedure: COLONOSCOPY WITH PROPOFOL;  Surgeon: Ronald Lobo, MD;  Location: [facility];  Service: Endoscopy;  Laterality: N/A;   COLONOSCOPY WITH PROPOFOL N/A 04/14/2020   Procedure: COLONOSCOPY WITH PROPOFOL;  Surgeon: Ronald Lobo, MD;  [facility];  Service: Endoscopy;  Laterality: N/A;   ESOPHAGOGASTRODUODENOSCOPY (EGD)  WITH PROPOFOL N/A 04/14/2020   Procedure: ESOPHAGOGASTRODUODENOSCOPY (EGD) WITH PROPOFOL;  Surgeon: Ronald Lobo, MD;  [facility];  Service: Endoscopy;  Laterality: N/A;   eye biospy  1979   dx with leukemia from this biospy   eye biospy  1979   rt eye, dx: leukemia   EYE SURGERY  1982   rt eye   NASAL SINUS SURGERY  1999   PLANTAR FASCIA SURGERY  2004, 2012   rt foot, left foot   POLYPECTOMY  04/14/2020   Procedure: POLYPECTOMY;  Surgeon: Ronald Lobo, MD;  [facility];  Service: Endoscopy;;   RADICAL HYSTERECTOMY     SCLEROTHERAPY  04/14/2020   Procedure: Clide Deutscher;  Surgeon: Ronald Lobo, MD;  Location: [facility];  Service: Endoscopy;;   skin cancer removal on shoulder  2/14   THYROID LOBECTOMY Left 09/25/2013   Procedure: LEFT THYROID LOBECTOMY;  Surgeon: Earnstine Regal, MD;  Location: [facility];  Service: General;  Laterality: Left;    There were no vitals filed for this visit.   Subjective Assessment - 07/19/21 1442     Subjective Pt. indicated 7/10 pain today.  Pt. indicated having increased pain about 4 days after last visit that created  trouble for several days before reducing.    Pertinent History CT scan, mild noncompressive disc buldge L2-L3, injections in past that weren't helpful  PMH: DM, urinary incontinence, obesity    Limitations Standing;Walking;House hold activities;Lifting    Diagnostic tests CT Scan    Patient Stated Goals Reduce pain    Currently in Pain? Yes    Pain Score 7     Pain Location Back    Pain Orientation Lower    Pain Descriptors / Indicators Contraction;Spasm    Pain Type Chronic pain    Pain Onset More than a month ago    Pain Frequency Constant    Aggravating Factors  insidious worsening for several days.    Pain Relieving Factors just trying to rest                [facility] PT Assessment - 07/19/21 0001       Assessment   Medical Diagnosis M54.50,G89.29 (ICD-10-CM) - Chronic left-sided low back pain,  unspecified whether sciatica present    Referring Provider (PT) Garald Balding, MD    Onset Date/Surgical Date 06/25/19    Hand Dominance Right                           [facility] Adult PT Treatment/Exercise - 07/19/21 0001       Lumbar Exercises: Aerobic   Nustep Lvl 5 UE/LE      Lumbar Exercises: Seated   Long Arc Quad on Chair Both;15 reps    Sit to Stand Other (comment);5 reps   18 inch table   Other Seated Lumbar Exercises seated pball press down 10 sec x 10                       PT Short Term Goals - 07/10/21 1204       PT SHORT TERM GOAL #1   Title Pt will be I and compliant with HEP.    Time 3    Period Weeks    Status On-going    Target Date 07/26/21               PT Long Term Goals - 07/05/21 1345       PT LONG TERM GOAL #1   Title Patient will demonstrate/report pain at worst less than or equal to 4/10 to facilitate minimal limitation in daily activity secondary to pain symptoms.    Time 10    Period Weeks    Status New    Target Date 09/13/21      PT LONG TERM GOAL #2   Title Patient will demonstrate independent use of home exercise program to facilitate ability to maintain/progress functional gains from skilled physical therapy services.    Time 10    Period Weeks    Status New    Target Date 09/13/21      PT LONG TERM GOAL #3   Title Pt. will demonstrate FOTO outcome > or = 48% to indicated reduced disability due to condition.    Time 10    Period Weeks    Status New    Target Date 09/13/21      PT LONG TERM GOAL #4   Title Pt. will demonstrate/report ability to walk > 30 mins to facilitate community ambulation, exercise desires.    Time 10    Period Weeks    Status New    Target Date 09/13/21  PT LONG TERM GOAL #5   Title Pt. will demonstrate lumbar ext > or = 75% WFL to facilitate upright standing, walking posture.    Time 10    Period Weeks    Status New    Target Date 09/13/21                    Plan - 07/19/21 1519     Clinical Impression Statement Continued to incorporate progressive intervention for general mobility and exercise tolerance with fair overall tolerance (no specific pain increases noted for back).  Pt. continued to be limited in daily activity due to back pain and knee pains.  Continued skilled PT services to promote improvement in mobility and strength indicated.    Personal Factors and Comorbidities Comorbidity 3+    Comorbidities DM, history of urinary incontinence, obesity    Examination-Activity Limitations Sit;Sleep;Bed Mobility;Squat;Bend;Stairs;Carry;Stand;Transfers;Lift;Locomotion Level    Examination-Participation Restrictions Cleaning;Shop;Community Activity;Interpersonal Relationship;Laundry;Meal Prep    Stability/Clinical Decision Making Evolving/Moderate complexity    Rehab Potential Fair    PT Frequency 2x / week    PT Duration Other (comment)   10 weeks   PT Treatment/Interventions ADLs/Self Care Home Management;Cryotherapy;Electrical Stimulation;Iontophoresis 4mg /ml Dexamethasone;Moist Heat;Traction;Balance training;Therapeutic exercise;Therapeutic activities;Functional mobility training;Stair training;Gait training;DME Instruction;Ultrasound;Neuromuscular re-education;Patient/family education;Passive range of motion;Spinal Manipulations;Joint Manipulations;Dry needling;Taping;Manual techniques    PT Next Visit Plan Progressive execise for neural desensitization and progress strengthening/endurance for core/LE    PT Home Exercise Plan [id number]    Consulted and Agree with Plan of Care Patient             Patient will benefit from skilled therapeutic intervention in order to improve the following deficits and impairments:  Abnormal gait, Decreased endurance, Hypomobility, Decreased activity tolerance, Decreased strength, Pain, Difficulty walking, Decreased mobility, Decreased range of motion, Impaired perceived functional ability,  Improper body mechanics, Postural dysfunction, Impaired flexibility  Visit Diagnosis: Chronic bilateral low back pain with bilateral sciatica  Muscle weakness (generalized)  Difficulty in walking, not elsewhere classified  Abnormal posture     Problem List Patient Active Problem List   Diagnosis Date Noted   Low back pain 06/08/2021   Vitamin B12 deficiency 04/01/2020   Iron deficiency anemia 01/15/2020   Deficiency anemia 01/15/2020   Thrombocytopenia ([place]) 01/15/2020   Diabetes ([place]) 09/07/2019   Osteopenia of forearm 09/07/2019   Gastroesophageal reflux disease 05/08/2019   Hyperglycemia 05/08/2019   Urinary incontinence 05/08/2019   History of colon polyps 05/08/2019   Mild intermittent asthma without complication 27/78/2423   BV (bacterial vaginosis) 01/01/2013   Anal fissure 07/07/2012   History of acute lymphoblastic leukemia (ALL) 01/21/2012   Endometriosis 01/21/2012   Class 3 severe obesity without serious comorbidity with body mass index (BMI) of 50.0 to 59.9 in adult ([place]) 11/28/2011   Multinodular goiter (nontoxic), dominant nodule left lobe 07/18/2011    Scot Jun, PT, DPT, OCS, ATC 07/19/21  3:22 PM    [facility] [address] [place], [place], [address] Phone: [phone]   Fax:  [phone]  Name: Marissa [name] MRN: [id number] Date of Birth: 04-15-1966

## 2021-07-21 ENCOUNTER — Telehealth: Payer: Self-pay | Admitting: Orthopaedic Surgery

## 2021-07-21 NOTE — Telephone Encounter (Signed)
Lauren-this pt was prescribed lyrica by Dr Junius Roads. I typically do not prescribe this med. Are we out of the prescription time line for his old patients? If not then OK to renew

## 2021-07-21 NOTE — Telephone Encounter (Signed)
Pt called requesting a refill of Lyrica. Please send to pharmacy on refill. Please call pt when refill has been sent in. Pt phone number is [phone].

## 2021-07-24 ENCOUNTER — Encounter: Payer: Medicare HMO | Admitting: Rehabilitative and Restorative Service Providers"

## 2021-07-24 NOTE — Telephone Encounter (Signed)
Tried calling to advise unable to fill at this time-Dr Hilts no longer part of our practice-need OV if needing medication.

## 2021-07-26 ENCOUNTER — Encounter: Payer: Medicare HMO | Admitting: Rehabilitative and Restorative Service Providers"

## 2021-07-31 ENCOUNTER — Ambulatory Visit: Payer: Medicare HMO | Admitting: Physical Therapy

## 2021-07-31 ENCOUNTER — Encounter: Payer: Self-pay | Admitting: Physical Therapy

## 2021-07-31 ENCOUNTER — Other Ambulatory Visit: Payer: Self-pay

## 2021-07-31 DIAGNOSIS — G8929 Other chronic pain: Secondary | ICD-10-CM

## 2021-07-31 DIAGNOSIS — M5442 Lumbago with sciatica, left side: Secondary | ICD-10-CM | POA: Diagnosis not present

## 2021-07-31 DIAGNOSIS — R262 Difficulty in walking, not elsewhere classified: Secondary | ICD-10-CM | POA: Diagnosis not present

## 2021-07-31 DIAGNOSIS — M6281 Muscle weakness (generalized): Secondary | ICD-10-CM | POA: Diagnosis not present

## 2021-07-31 DIAGNOSIS — R293 Abnormal posture: Secondary | ICD-10-CM

## 2021-07-31 DIAGNOSIS — M5441 Lumbago with sciatica, right side: Secondary | ICD-10-CM | POA: Diagnosis not present

## 2021-07-31 NOTE — Therapy (Addendum)
[facility] Physical Therapy [address] [place], [place], [address] Phone: [phone]   Fax:  [phone]  Physical Therapy Treatment  Patient Details  Name: Marissa [name] MRN: [id number] Date of Birth: 12-Oct-1965 Referring Provider (PT): Garald Balding, MD   Encounter Date: 07/31/2021   PT End of Session - 07/31/21 1318     Visit Number 4    Number of Visits 20    Date for PT Re-Evaluation 09/13/21    Authorization Type [employer] Medicare $35 copay    Progress Note Due on Visit 10    PT Start Time 1300    PT Stop Time 1340    PT Time Calculation (min) 40 min    Activity Tolerance Patient limited by pain    Behavior During Therapy [facility] for tasks assessed/performed             Past Medical History:  Diagnosis Date   Abdominal pain    INTERMITANT   Anemia    Asthma    GERD (gastroesophageal reflux disease)    H/O hiatal hernia    Hemorrhoids    History of transfusion    IBS (irritable bowel syndrome)    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, acute lymphatic with T cell   Plantar fasciitis    Plantar fasciitis of left foot FALL 2012   HAD SURGERY TO FIX IT   Prediabetes    Rectal pain    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   Thyroid nodule     Past Surgical History:  Procedure Laterality Date   ABDOMINAL HYSTERECTOMY  2003   BSO   BIOPSY  04/14/2020   Procedure: BIOPSY;  Surgeon: Ronald Lobo, MD;  Location: [facility];  Service: Endoscopy;;   BIOPSY THYROID  11/2009   benign   carpel tunnel  2004   rt hand   COLONOSCOPY WITH PROPOFOL N/A 11/08/2016   Procedure: COLONOSCOPY WITH PROPOFOL;  Surgeon: Ronald Lobo, MD;  Location: [facility];  Service: Endoscopy;  Laterality: N/A;   COLONOSCOPY WITH PROPOFOL N/A 04/14/2020   Procedure: COLONOSCOPY WITH PROPOFOL;  Surgeon: Ronald Lobo, MD;  Location: [facility];  Service: Endoscopy;  Laterality: N/A;   ESOPHAGOGASTRODUODENOSCOPY (EGD) WITH PROPOFOL N/A 04/14/2020   Procedure:  ESOPHAGOGASTRODUODENOSCOPY (EGD) WITH PROPOFOL;  Surgeon: Ronald Lobo, MD;  [facility]: [facility];  Service: Endoscopy;  Laterality: N/A;   eye biospy  1979   dx with leukemia from this biospy   eye biospy  1979   rt eye, dx: leukemia   EYE SURGERY  1982   rt eye   NASAL SINUS SURGERY  1999   PLANTAR FASCIA SURGERY  2004, 2012   rt foot, left foot   POLYPECTOMY  04/14/2020   Procedure: POLYPECTOMY;  Surgeon: Ronald Lobo, MD;  Location: [facility];  Service: Endoscopy;;   RADICAL HYSTERECTOMY     SCLEROTHERAPY  04/14/2020   Procedure: Clide Deutscher;  Surgeon: Ronald Lobo, MD;  Location: [facility];  Service: Endoscopy;;   skin cancer removal on shoulder  2/14   THYROID LOBECTOMY Left 09/25/2013   Procedure: LEFT THYROID LOBECTOMY;  Surgeon: Earnstine Regal, MD;  Location: [facility];  Service: General;  Laterality: Left;    There were no vitals filed for this visit.   Subjective Assessment - 07/31/21 1305     Subjective Pt arriving today reporting 4/10 pain in right sided low back with radiation into right hip.    Pertinent History CT scan, mild noncompressive disc  buldge L2-L3, injections in past that weren't helpful  PMH: DM, urinary incontinence, obesity    Limitations Standing;Walking;House hold activities;Lifting    Patient Stated Goals Reduce pain    Currently in Pain? Yes    Pain Score 4     Pain Location Back    Pain Orientation Right;Lower    Pain Descriptors / Indicators Aching    Pain Type Chronic pain    Pain Onset More than a month ago                               [facility] Adult PT Treatment/Exercise - 07/31/21 0001       Neuro Re-ed    Neuro Re-ed Details  standing on Airex x 2 minutes with intermittent UE support and close supervision      Lumbar Exercises: Stretches   Other Lumbar Stretch Exercise seated while walking green physioball from side to side in semi-circular motion and holding end range x 5 seconds, 10 x each side       Lumbar Exercises: Aerobic   Nustep Lvl 5 UE/LE      Lumbar Exercises: Seated   Long Arc Quad on Chair Strengthening;Both;15 reps    LAQ on Chair Weights (lbs) 2    Sit to Stand Other (comment);5 reps   18 inch table   Other Seated Lumbar Exercises seated physioball press downs with cues for abdominal activation holding 10 seconds x 10 reps, hamstring and glute sets x 15 holding 5 seconds each,                       PT Short Term Goals - 07/10/21 1204       PT SHORT TERM GOAL #1   Title Pt will be I and compliant with HEP.    Time 3    Period Weeks    Status On-going    Target Date 07/26/21               PT Long Term Goals - 07/31/21 1315       PT LONG TERM GOAL #1   Title Patient will demonstrate/report pain at worst less than or equal to 4/10 to facilitate minimal limitation in daily activity secondary to pain symptoms.    Baseline 21%    Period Weeks    Status On-going      PT LONG TERM GOAL #2   Title Patient will demonstrate independent use of home exercise program to facilitate ability to maintain/progress functional gains from skilled physical therapy services.      PT LONG TERM GOAL #3   Title Pt. will demonstrate FOTO outcome > or = 48% to indicated reduced disability due to condition.    Status On-going      PT LONG TERM GOAL #4   Title Pt. will demonstrate/report ability to walk > 30 mins to facilitate community ambulation, exercise desires.    Status On-going      PT LONG TERM GOAL #5   Title Pt. will demonstrate lumbar ext > or = 75% WFL to facilitate upright standing, walking posture.    Status On-going                   Plan - 07/31/21 1312     Clinical Impression Statement Pt tolerating sitting exercises well for low back, glute and knee pain. Pt with no reports of increased pain during exercises today. Pt  requesting seated exercises rather than supine or sidelying. Continue as pt tolerates. toward LTG's set.    Personal  Factors and Comorbidities Comorbidity 3+    Comorbidities DM, history of urinary incontinence, obesity    Examination-Activity Limitations Sit;Sleep;Bed Mobility;Squat;Bend;Stairs;Carry;Stand;Transfers;Lift;Locomotion Level    Examination-Participation Restrictions Cleaning;Shop;Community Activity;Interpersonal Relationship;Laundry;Meal Prep    Stability/Clinical Decision Making Evolving/Moderate complexity    Rehab Potential Fair    PT Frequency 2x / week    PT Duration Other (comment)    PT Treatment/Interventions ADLs/Self Care Home Management;Cryotherapy;Electrical Stimulation;Iontophoresis 4mg /ml Dexamethasone;Moist Heat;Traction;Balance training;Therapeutic exercise;Therapeutic activities;Functional mobility training;Stair training;Gait training;DME Instruction;Ultrasound;Neuromuscular re-education;Patient/family education;Passive range of motion;Spinal Manipulations;Joint Manipulations;Dry needling;Taping;Manual techniques    PT Next Visit Plan Progressive execise for neural desensitization and progress strengthening/endurance for core/LE    PT Home Exercise Plan [email or web address]    Consulted and Agree with Plan of Care Patient             Patient will benefit from skilled therapeutic intervention in order to improve the following deficits and impairments:  Abnormal gait, Decreased endurance, Hypomobility, Decreased activity tolerance, Decreased strength, Pain, Difficulty walking, Decreased mobility, Decreased range of motion, Impaired perceived functional ability, Improper body mechanics, Postural dysfunction, Impaired flexibility  Visit Diagnosis: Chronic bilateral low back pain with bilateral sciatica  Muscle weakness (generalized)  Difficulty in walking, not elsewhere classified  Abnormal posture     Problem List Patient Active Problem List   Diagnosis Date Noted   Low back pain 06/08/2021   Vitamin B12 deficiency 04/01/2020   Iron deficiency anemia 01/15/2020   Deficiency  anemia 01/15/2020   Thrombocytopenia ([place]) 01/15/2020   Diabetes ([place]) 09/07/2019   Osteopenia of forearm 09/07/2019   Gastroesophageal reflux disease 05/08/2019   Hyperglycemia 05/08/2019   Urinary incontinence 05/08/2019   History of colon polyps 05/08/2019   Mild intermittent asthma without complication 47/42/5956   BV (bacterial vaginosis) 01/01/2013   Anal fissure 07/07/2012   History of acute lymphoblastic leukemia (ALL) 01/21/2012   Endometriosis 01/21/2012   Class 3 severe obesity without serious comorbidity with body mass index (BMI) of 50.0 to 59.9 in adult ([place]) 11/28/2011   Multinodular goiter (nontoxic), dominant nodule left lobe 07/18/2011    Oretha Caprice, PT, MPT 07/31/2021, 1:39 PM  [facility] Physical Therapy [address] [place], [place], [address] Phone: [phone]   Fax:  [phone]  Name: Marissa [name] MRN: [id number] Date of Birth: [birth year]

## 2021-08-02 ENCOUNTER — Telehealth: Payer: Self-pay | Admitting: Rehabilitative and Restorative Service Providers"

## 2021-08-02 ENCOUNTER — Encounter: Payer: Medicare HMO | Admitting: Rehabilitative and Restorative Service Providers"

## 2021-08-02 NOTE — Telephone Encounter (Signed)
After calling patient, she indicated she cancelled the appointment when she was here for last appointment.   Scot Jun, PT, DPT, OCS, ATC 08/02/21  1:21 PM

## 2021-08-07 ENCOUNTER — Ambulatory Visit: Payer: Medicare HMO | Admitting: Rehabilitative and Restorative Service Providers"

## 2021-08-07 ENCOUNTER — Other Ambulatory Visit: Payer: Self-pay

## 2021-08-07 ENCOUNTER — Encounter: Payer: Self-pay | Admitting: Rehabilitative and Restorative Service Providers"

## 2021-08-07 DIAGNOSIS — G8929 Other chronic pain: Secondary | ICD-10-CM | POA: Diagnosis not present

## 2021-08-07 DIAGNOSIS — M5441 Lumbago with sciatica, right side: Secondary | ICD-10-CM

## 2021-08-07 DIAGNOSIS — M5442 Lumbago with sciatica, left side: Secondary | ICD-10-CM | POA: Diagnosis not present

## 2021-08-07 DIAGNOSIS — M6281 Muscle weakness (generalized): Secondary | ICD-10-CM

## 2021-08-07 DIAGNOSIS — R293 Abnormal posture: Secondary | ICD-10-CM

## 2021-08-07 DIAGNOSIS — R262 Difficulty in walking, not elsewhere classified: Secondary | ICD-10-CM

## 2021-08-07 NOTE — Patient Instructions (Signed)
Access Code: [email or web address] URL: [email or web address] Date: 08/07/2021 Prepared by: Scot Jun  Exercises Seated Scapular Retraction - 2 x daily - 7 x weekly - 2 sets - 10 reps - 5 hold Sit to Stand - 2 x daily - 7 x weekly - 3 sets - 10 reps Seated Gluteal Sets - 1 x daily - 7 x weekly - 1 sets - 10 reps - 5 hold Seated Long Arc Quad - 1 x daily - 7 x weekly - 3 sets - 10 reps - 2 hold Seated March - 1 x daily - 7 x weekly - 3 sets - 10 reps

## 2021-08-07 NOTE — Therapy (Addendum)
[facility] Physical Therapy [address] [place], [place], [address] Phone: [phone]   Fax:  [phone]  Physical Therapy Treatment/Progress Note/ Discharge  Patient Details  Name: Marissa [name] MRN: [id number] Date of Birth: 1966-02-05 Referring Provider (PT): Garald Balding, MD   Encounter Date: 08/07/2021  Progress Note Reporting Period 07/05/2021 to 08/07/2021  See note below for Objective Data and Assessment of Progress/Goals.       PT End of Session - 08/07/21 1307     Visit Number 5    Number of Visits 20    Date for PT Re-Evaluation 09/13/21    Authorization Type AETNA Medicare $35 copay    Progress Note Due on Visit 15    PT Start Time 1300    PT Stop Time 1338    PT Time Calculation (min) 38 min    Activity Tolerance Patient limited by pain    Behavior During Therapy WFL for tasks assessed/performed             Past Medical History:  Diagnosis Date   Abdominal pain    INTERMITANT   Anemia    Asthma    GERD (gastroesophageal reflux disease)    H/O hiatal hernia    Hemorrhoids    History of transfusion    IBS (irritable bowel syndrome)    Leukemia ([place])    as a child, acute lymphatic with T cell   Plantar fasciitis    Plantar fasciitis of left foot FALL 2012   HAD SURGERY TO FIX IT   Prediabetes    Rectal pain    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   Thyroid nodule     Past Surgical History:  Procedure Laterality Date   ABDOMINAL HYSTERECTOMY  2003   BSO   BIOPSY  04/14/2020   Procedure: BIOPSY;  Surgeon: Ronald Lobo, MD;  [facility]: [facility];  Service: Endoscopy;;   BIOPSY THYROID  11/2009   benign   carpel tunnel  2004   rt hand   COLONOSCOPY WITH PROPOFOL N/A 11/08/2016   Procedure: COLONOSCOPY WITH PROPOFOL;  Surgeon: Ronald Lobo, MD;  Location: [facility];  Service: Endoscopy;  Laterality: N/A;   COLONOSCOPY WITH PROPOFOL N/A 04/14/2020   Procedure: COLONOSCOPY WITH PROPOFOL;  Surgeon:  Ronald Lobo, MD;  [facility];  Service: Endoscopy;  Laterality: N/A;   ESOPHAGOGASTRODUODENOSCOPY (EGD) WITH PROPOFOL N/A 04/14/2020   Procedure: ESOPHAGOGASTRODUODENOSCOPY (EGD) WITH PROPOFOL;  Surgeon: Ronald Lobo, MD;  [facility];  Service: Endoscopy;  Laterality: N/A;   eye biospy  1979   dx with leukemia from this biospy   eye biospy  1979   rt eye, dx: leukemia   EYE SURGERY  1982   rt eye   NASAL SINUS SURGERY  1999   PLANTAR FASCIA SURGERY  2004, 2012   rt foot, left foot   POLYPECTOMY  04/14/2020   Procedure: POLYPECTOMY;  Surgeon: Ronald Lobo, MD;  [facility];  Service: Endoscopy;;   RADICAL HYSTERECTOMY     SCLEROTHERAPY  04/14/2020   Procedure: Clide Deutscher;  Surgeon: Ronald Lobo, MD;  [facility]: [facility];  Service: Endoscopy;;   skin cancer removal on shoulder  2/14   THYROID LOBECTOMY Left 09/25/2013   Procedure: LEFT THYROID LOBECTOMY;  Surgeon: Earnstine Regal, MD;  [facility]: [facility];  Service: General;  Laterality: Left;    There were no vitals filed for this visit.   Subjective Assessment - 08/07/21 1303     Subjective Pt.  indicated feeling like the weather hasn't helped a whole lot.  Pt. indicated she had a mixup related to medicine resulting in a withdraw feeling but over that.  Pt. indicated symptoms today about 5 or so.  Pt. indicated Lt hip and also some shoulder pains.  Pt. indicated doing the exercises "when she can, " trying to get more activity.  Global Rating of Change +2.    Pertinent History CT scan, mild noncompressive disc buldge L2-L3, injections in past that weren't helpful  PMH: DM, urinary incontinence, obesity    Limitations Standing;Walking;House hold activities;Lifting    Patient Stated Goals Reduce pain    Pain Score 5     Pain Location Back    Pain Orientation Lower    Pain Descriptors / Indicators Aching    Pain Radiating Towards back, Lt hip    Pain Onset More than a month ago    Pain  Frequency Constant    Aggravating Factors  weather, worse    Pain Relieving Factors rest                [facility] PT Assessment - 08/07/21 0001       Assessment   Medical Diagnosis M54.50,G89.29 (ICD-10-CM) - Chronic left-sided low back pain, unspecified whether sciatica present    Referring Provider (PT) Garald Balding, MD    Onset Date/Surgical Date 06/25/19    Hand Dominance Right      Observation/Other Assessments   Focus on Therapeutic Outcomes (FOTO)  updated 40%      AROM   Lumbar Flexion movement to toes, back pain indicated    Lumbar Extension 75% ERP in back/Rt hip      Strength   Right Hip Flexion 5/5    Left Hip Flexion 5/5                           [facility] Adult PT Treatment/Exercise - 08/07/21 0001       Exercises   Other Exercises  Verbal review of HEP      Lumbar Exercises: Stretches   Other Lumbar Stretch Exercise seated while walking green physioball from side to side in semi-circular motion and holding end range x 5 seconds, 10 x each side      Lumbar Exercises: Aerobic   Nustep Lvl 5 VX/BL39 mins      Lumbar Exercises: Seated   Other Seated Lumbar Exercises seated green band clam shell 20x each LE                     PT Education - 08/07/21 1332     Education Details HEP progression    Person(s) Educated Patient    Methods Explanation;Demonstration;Verbal cues;Handout    Comprehension Returned demonstration;Verbalized understanding              PT Short Term Goals - 08/07/21 1332       PT SHORT TERM GOAL #1   Title Pt will be I and compliant with HEP.    Time 3    Period Weeks    Status Achieved    Target Date 07/26/21               PT Long Term Goals - 07/31/21 1315       PT LONG TERM GOAL #1   Title Patient will demonstrate/report pain at worst less than or equal to 4/10 to facilitate minimal limitation in daily activity secondary to pain symptoms.  Baseline 21%    Period Weeks     Status On-going      PT LONG TERM GOAL #2   Title Patient will demonstrate independent use of home exercise program to facilitate ability to maintain/progress functional gains from skilled physical therapy services.      PT LONG TERM GOAL #3   Title Pt. will demonstrate FOTO outcome > or = 48% to indicated reduced disability due to condition.    Status On-going      PT LONG TERM GOAL #4   Title Pt. will demonstrate/report ability to walk > 30 mins to facilitate community ambulation, exercise desires.    Status On-going      PT LONG TERM GOAL #5   Title Pt. will demonstrate lumbar ext > or = 75% WFL to facilitate upright standing, walking posture.    Status On-going                   Plan - 08/07/21 1318     Clinical Impression Statement Reassessment of movement patterns revealed mild changes in mobilty and strength gains to this point.  FOTO reassessment was documented as mild improvement.  Pt. may continue to benefit from plan in clinic and long term in home program to encourage increased mobility and activity.    Personal Factors and Comorbidities Comorbidity 3+    Comorbidities DM, history of urinary incontinence, obesity    Examination-Activity Limitations Sit;Sleep;Bed Mobility;Squat;Bend;Stairs;Carry;Stand;Transfers;Lift;Locomotion Level    Examination-Participation Restrictions Cleaning;Shop;Community Activity;Interpersonal Relationship;Laundry;Meal Prep    Stability/Clinical Decision Making Evolving/Moderate complexity    Rehab Potential Fair    PT Frequency 2x / week    PT Duration Other (comment)    PT Treatment/Interventions ADLs/Self Care Home Management;Cryotherapy;Electrical Stimulation;Iontophoresis 26m/ml Dexamethasone;Moist Heat;Traction;Balance training;Therapeutic exercise;Therapeutic activities;Functional mobility training;Stair training;Gait training;DME Instruction;Ultrasound;Neuromuscular re-education;Patient/family education;Passive range of  motion;Spinal Manipulations;Joint Manipulations;Dry needling;Taping;Manual techniques    PT Next Visit Plan Continued progression in progressive exercise and lumbar/hip mobility gains when scheduled.    PT Home Exercise Plan [email or web address]   Consulted and Agree with Plan of Care Patient             Patient will benefit from skilled therapeutic intervention in order to improve the following deficits and impairments:  Abnormal gait, Decreased endurance, Hypomobility, Decreased activity tolerance, Decreased strength, Pain, Difficulty walking, Decreased mobility, Decreased range of motion, Impaired perceived functional ability, Improper body mechanics, Postural dysfunction, Impaired flexibility  Visit Diagnosis: Chronic bilateral low back pain with bilateral sciatica  Muscle weakness (generalized)  Difficulty in walking, not elsewhere classified  Abnormal posture     Problem List Patient Active Problem List   Diagnosis Date Noted   Low back pain 06/08/2021   Vitamin B12 deficiency 04/01/2020   Iron deficiency anemia 01/15/2020   Deficiency anemia 01/15/2020   [place] 01/15/2020   [place] 09/07/2019   Osteopenia of forearm 09/07/2019   Gastroesophageal reflux disease 05/08/2019   Hyperglycemia 05/08/2019   Urinary incontinence 05/08/2019   History of colon polyps 05/08/2019   Mild intermittent asthma without complication 060/73/7106  BV (bacterial vaginosis) 01/01/2013   Anal fissure 07/07/2012   History of acute lymphoblastic leukemia (ALL) 01/21/2012   Endometriosis 01/21/2012   Class 3 severe obesity without serious comorbidity with body mass index (BMI) of 50.0 to 59.9 in adult ([place] 11/28/2011   Multinodular goiter (nontoxic), dominant nodule left lobe 07/18/2011    MScot Jun PT, DPT, OCS, ATC 08/07/21  1:39 PM     [facility]  [address] [place], [place], [address] Phone: [phone]   Fax:   [phone]  Name: Marissa [name] MRN: [id number] Date of Birth: 09-02-1966 PHYSICAL THERAPY DISCHARGE SUMMARY  Visits from Start of Care: 5  Current functional level related to goals / functional outcomes: See above   Remaining deficits: See above   Education / Equipment: HEP   Patient agrees to discharge. Patient goals were partially met. Patient is being discharged due to not returning since the last visit.  Kearney Hard, PT, MPT 09/19/21 10:44 AM

## 2021-08-09 ENCOUNTER — Encounter: Payer: Medicare HMO | Admitting: Rehabilitative and Restorative Service Providers"

## 2021-08-11 DIAGNOSIS — R609 Edema, unspecified: Secondary | ICD-10-CM | POA: Diagnosis not present

## 2021-08-11 DIAGNOSIS — J45909 Unspecified asthma, uncomplicated: Secondary | ICD-10-CM | POA: Diagnosis not present

## 2021-08-11 DIAGNOSIS — D509 Iron deficiency anemia, unspecified: Secondary | ICD-10-CM | POA: Diagnosis not present

## 2021-08-11 DIAGNOSIS — E039 Hypothyroidism, unspecified: Secondary | ICD-10-CM | POA: Diagnosis not present

## 2021-08-11 DIAGNOSIS — K219 Gastro-esophageal reflux disease without esophagitis: Secondary | ICD-10-CM | POA: Diagnosis not present

## 2021-08-11 DIAGNOSIS — E7800 Pure hypercholesterolemia, unspecified: Secondary | ICD-10-CM | POA: Diagnosis not present

## 2021-08-11 DIAGNOSIS — N393 Stress incontinence (female) (male): Secondary | ICD-10-CM | POA: Diagnosis not present

## 2021-08-11 DIAGNOSIS — E1169 Type 2 diabetes mellitus with other specified complication: Secondary | ICD-10-CM | POA: Diagnosis not present

## 2021-08-11 DIAGNOSIS — M1990 Unspecified osteoarthritis, unspecified site: Secondary | ICD-10-CM | POA: Diagnosis not present

## 2021-08-31 ENCOUNTER — Encounter: Payer: Medicare HMO | Attending: Physical Medicine & Rehabilitation | Admitting: Physical Medicine & Rehabilitation

## 2021-08-31 ENCOUNTER — Other Ambulatory Visit: Payer: Self-pay

## 2021-08-31 ENCOUNTER — Encounter: Payer: Self-pay | Admitting: Physical Medicine & Rehabilitation

## 2021-08-31 VITALS — BP 143/79 | HR 92 | Temp 98.3°F | Ht 63.25 in | Wt 306.0 lb

## 2021-08-31 DIAGNOSIS — Z79891 Long term (current) use of opiate analgesic: Secondary | ICD-10-CM | POA: Insufficient documentation

## 2021-08-31 DIAGNOSIS — G894 Chronic pain syndrome: Secondary | ICD-10-CM | POA: Diagnosis not present

## 2021-08-31 DIAGNOSIS — Z5181 Encounter for therapeutic drug level monitoring: Secondary | ICD-10-CM | POA: Diagnosis not present

## 2021-08-31 NOTE — Progress Notes (Signed)
Subjective:    Patient ID: Marissa [name], female    DOB: 1966-03-29, 55 y.o.   MRN: [id number] CC:  Low back , hip , shoulder, hand, knee, and foot pain HPI Patient states that the back pain is probably the most problematic.  Her average pain is in the 5-6 out of 10 range described as intermittent sharp burning dull stabbing tingling and aching.  Sleep is poor pain is worse with walking bending sitting and activity standing improves with rest heat ice medication. Walking tolerance 15 minutes She drives She does not climb steps Last employed May 2013, received her disability September 24, 2016.  She needs assistance with meal prep household duties and shopping. She spends about 12 hours a day in bed 8 hours a day sitting 1 hour a day standing and 3 hours a day walking. She is taking tramadol for pain only about once a day Patient has seen orthopedics, CT of the lumbar spine was taking and these results were reviewed as well as the actual films with the patient. 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 General activity 6 Relation with others 7 Enjoyment of life 7 What TIME of day is your pain at its worst? morning , daytime, evening, and night Sleep (in general) Poor  Pain is worse with: walking, bending, sitting, inactivity, standing, and some activites Pain improves with: rest,  heat/ice, and medication Relief from Meds: 6  walk without assistance how many minutes can you walk? 15 ability to climb steps?  no do you drive?  yes  disabled: date disabled 09/24/2016 I need assistance with the following:  meal prep, household duties, and shopping  bladder control problems bowel control problems weakness numbness tingling trouble walking spasms dizziness anxiety  Any changes since last visit?  no  Any changes since last visit?  no    Family History  Problem Relation Age of Onset   Cancer Mother 55       LUNG   Diabetes Mother    Diabetes Father    Stroke Father    Heart disease Father    Vision loss Father        GLAUCOMA   Cancer Maternal Aunt        breast   Breast cancer Maternal Aunt    Diverticulosis Other    Colon cancer Neg Hx    Social History   Socioeconomic History   Marital status: Single    Spouse name: Not on file   Number of children: Not on file   Years of education: Not on file   Highest education level: Not on file  Occupational History   Not on file  Tobacco Use   Smoking status: Former    Types: Cigarettes    Quit date: 09/15/1991    Years since quitting: 29.9   Smokeless tobacco: Never  Vaping Use   Vaping Use: Never used  Substance and Sexual Activity   Alcohol use: Yes    Alcohol/week: 0.0 standard drinks    Comment: rarely   Drug use: No   Sexual activity: Never    Partners: Male    Birth control/protection: Surgical    Comment: hyst.....Marland KitchenINTERCOURSE AGE 27, SEXUAL PARTNERS LESS THAN 5  Other Topics Concern   Not on file  Social History Narrative   Not on file   Social Determinants of Health   Financial Resource Strain: Not on file  Food Insecurity: Not on file  Transportation Needs: Not on file  Physical Activity: Not on file  Stress: Not on file  Social Connections: Not on file   Past Surgical History:  Procedure Laterality Date   ABDOMINAL HYSTERECTOMY  2003   BSO   BIOPSY  04/14/2020    Procedure: BIOPSY;  Surgeon: Ronald Lobo, MD;  Location: [facility];  Service: Endoscopy;;   BIOPSY THYROID  11/2009   benign   carpel tunnel  2004   rt hand   COLONOSCOPY WITH PROPOFOL N/A 11/08/2016   Procedure: COLONOSCOPY WITH PROPOFOL;  Surgeon: Ronald Lobo, MD;  Location: [facility];  Service: Endoscopy;  Laterality: N/A;   COLONOSCOPY WITH PROPOFOL N/A 04/14/2020   Procedure: COLONOSCOPY WITH PROPOFOL;  Surgeon: Ronald Lobo, MD;  Location: [facility];  Service: Endoscopy;  Laterality: N/A;   ESOPHAGOGASTRODUODENOSCOPY (EGD) WITH PROPOFOL N/A 04/14/2020   Procedure: ESOPHAGOGASTRODUODENOSCOPY (EGD) WITH PROPOFOL;  Surgeon: Ronald Lobo, MD;  Location: [facility];  Service: Endoscopy;  Laterality: N/A;   eye biospy  1979   dx with leukemia from this biospy   eye biospy  1979   rt eye, dx: leukemia   EYE SURGERY  1982   rt eye   NASAL SINUS SURGERY  1999   PLANTAR FASCIA SURGERY  2004, 2012   rt foot, left foot   POLYPECTOMY  04/14/2020   Procedure: POLYPECTOMY;  Surgeon: Ronald Lobo, MD;  Location: [facility];  Service: Endoscopy;;   RADICAL HYSTERECTOMY     SCLEROTHERAPY  04/14/2020   Procedure: Clide Deutscher;  Surgeon: Ronald Lobo, MD;  Location: [facility];  Service: Endoscopy;;   skin cancer removal on shoulder  2/14   THYROID LOBECTOMY Left 09/25/2013   Procedure: LEFT THYROID LOBECTOMY;  Surgeon: Earnstine Regal, MD;  Location: [facility];  Service: General;  Laterality: Left;   Past Medical History:  Diagnosis Date   Abdominal pain    INTERMITANT   Anemia    Asthma    GERD (gastroesophageal reflux disease)    H/O hiatal hernia    Hemorrhoids    History of transfusion    IBS (irritable bowel syndrome)    Leukemia ([place])    as a child, acute lymphatic with T cell   Plantar fasciitis    Plantar fasciitis of left foot FALL 2012   HAD SURGERY TO FIX IT   Prediabetes    Rectal pain    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    Review of Systems  Respiratory:  Positive for wheezing.   Gastrointestinal:  Positive for abdominal pain, constipation, diarrhea and nausea. Negative for abdominal distention.  Musculoskeletal:  Positive for back pain and gait problem.       Bilateral shoulder. Wrist, knee, hip, ankle pain  Neurological:  Positive for dizziness, weakness and numbness.  Psychiatric/Behavioral:  The patient is nervous/anxious.   All other systems reviewed and are negative.     Objective:   Physical Exam Vitals and nursing note reviewed.  Constitutional:      Appearance: She is obese.  HENT:     Head: Normocephalic and atraumatic.     Mouth/Throat:     Mouth: Mucous membranes are moist.  Eyes:     Extraocular Movements: Extraocular movements intact.     Conjunctiva/sclera: Conjunctivae normal.     Pupils: Pupils are equal, round, and reactive to light.  Cardiovascular:     Rate and Rhythm: Regular rhythm.     Heart sounds: Normal heart sounds.  Pulmonary:     Effort: Pulmonary effort is normal.     Breath sounds: Normal breath sounds.  Abdominal:     General: Bowel sounds are normal. There is no distension.     Palpations: Abdomen is soft.  Musculoskeletal:        General: No swelling.     Cervical back: Normal range of motion.  Skin:    General: Skin is warm and dry.  Neurological:     Mental Status: She is alert and oriented to person, place, and time.  Psychiatric:        Mood and Affect: Mood normal.        Behavior: Behavior normal.          Assessment & Plan:  #1.  Chronic widespread pain primary pain generators in the low back as well as bilateral knees. In terms of knee pain, need to avoid corticosteroids  given her history of AVN.  May benefit from viscosupplementation versus genicular nerve block/RF In regards to her low back pain given her body habitus would suspect lumbar spondylosis with facet arthropathy. Continue tramadol we will check drug screen with start 50 nightly as initial dose.

## 2021-09-06 ENCOUNTER — Telehealth: Payer: Self-pay

## 2021-09-06 DIAGNOSIS — N393 Stress incontinence (female) (male): Secondary | ICD-10-CM | POA: Diagnosis not present

## 2021-09-06 DIAGNOSIS — M545 Low back pain, unspecified: Secondary | ICD-10-CM | POA: Diagnosis not present

## 2021-09-06 NOTE — Telephone Encounter (Signed)
Marissa [name] called to cancel her Opioid Agreement. Per patient her PCP will be prescribing her pain medications. Agreement has been voided as of today 09/06/2021 in Environmental health practitioner.

## 2021-09-07 ENCOUNTER — Telehealth: Payer: Self-pay | Admitting: *Deleted

## 2021-09-07 LAB — DRUG TOX MONITOR 1 W/CONF, ORAL FLD
Amphetamines: NEGATIVE ng/mL (ref <10)
Barbiturates: NEGATIVE ng/mL (ref <10)
Benzodiazepines: NEGATIVE ng/mL (ref <0.50)
Buprenorphine: NEGATIVE ng/mL (ref <0.10)
Cocaine: NEGATIVE ng/mL (ref <5.0)
Fentanyl: NEGATIVE ng/mL (ref <0.10)
Heroin Metabolite: NEGATIVE ng/mL (ref <1.0)
MARIJUANA: NEGATIVE ng/mL (ref <2.5)
MDMA: NEGATIVE ng/mL (ref <10)
Meprobamate: NEGATIVE ng/mL (ref <2.5)
Methadone: NEGATIVE ng/mL (ref <5.0)
Nicotine Metabolite: NEGATIVE ng/mL (ref <5.0)
Opiates: NEGATIVE ng/mL (ref <2.5)
Phencyclidine: NEGATIVE ng/mL (ref <10)
Tapentadol: NEGATIVE ng/mL (ref <5.0)
Tramadol: 480.9 ng/mL — ABNORMAL HIGH (ref <5.0)
Tramadol: POSITIVE ng/mL — AB (ref <5.0)
Zolpidem: NEGATIVE ng/mL (ref <5.0)

## 2021-09-07 LAB — DRUG TOX ALC METAB W/CON, ORAL FLD: Alcohol Metabolite: NEGATIVE ng/mL (ref <25)

## 2021-09-07 NOTE — Telephone Encounter (Signed)
Oral swab drug screen was consistent for prescribed medications.  ?

## 2021-09-13 DIAGNOSIS — E890 Postprocedural hypothyroidism: Secondary | ICD-10-CM | POA: Diagnosis not present

## 2021-09-13 DIAGNOSIS — C9590 Leukemia, unspecified not having achieved remission: Secondary | ICD-10-CM | POA: Diagnosis not present

## 2021-09-13 DIAGNOSIS — M1990 Unspecified osteoarthritis, unspecified site: Secondary | ICD-10-CM | POA: Diagnosis not present

## 2021-09-13 DIAGNOSIS — N3941 Urge incontinence: Secondary | ICD-10-CM | POA: Diagnosis not present

## 2021-09-13 DIAGNOSIS — K219 Gastro-esophageal reflux disease without esophagitis: Secondary | ICD-10-CM | POA: Diagnosis not present

## 2021-09-13 DIAGNOSIS — G8929 Other chronic pain: Secondary | ICD-10-CM | POA: Diagnosis not present

## 2021-09-13 DIAGNOSIS — E119 Type 2 diabetes mellitus without complications: Secondary | ICD-10-CM | POA: Diagnosis not present

## 2021-09-13 DIAGNOSIS — I10 Essential (primary) hypertension: Secondary | ICD-10-CM | POA: Diagnosis not present

## 2021-09-13 DIAGNOSIS — R609 Edema, unspecified: Secondary | ICD-10-CM | POA: Diagnosis not present

## 2021-09-13 DIAGNOSIS — Z6841 Body Mass Index (BMI) 40.0 and over, adult: Secondary | ICD-10-CM | POA: Diagnosis not present

## 2021-09-13 DIAGNOSIS — J45909 Unspecified asthma, uncomplicated: Secondary | ICD-10-CM | POA: Diagnosis not present

## 2021-09-27 DIAGNOSIS — J301 Allergic rhinitis due to pollen: Secondary | ICD-10-CM | POA: Diagnosis not present

## 2021-09-27 DIAGNOSIS — J3081 Allergic rhinitis due to animal (cat) (dog) hair and dander: Secondary | ICD-10-CM | POA: Diagnosis not present

## 2021-09-27 DIAGNOSIS — J4530 Mild persistent asthma, uncomplicated: Secondary | ICD-10-CM | POA: Diagnosis not present

## 2021-09-27 DIAGNOSIS — J019 Acute sinusitis, unspecified: Secondary | ICD-10-CM | POA: Diagnosis not present

## 2021-09-27 DIAGNOSIS — J3089 Other allergic rhinitis: Secondary | ICD-10-CM | POA: Diagnosis not present

## 2021-10-17 ENCOUNTER — Ambulatory Visit: Payer: Medicare HMO | Admitting: Physical Medicine & Rehabilitation

## 2021-11-13 IMAGING — MG DIGITAL SCREENING BILAT W/ TOMO W/ CAD
6 of 12 series · 6 of 36 positions shown · non-contrast
Comparison: Previous exam(s).

ACR Breast Density Category a: The breast tissue is almost entirely
fatty.

CLINICAL DATA: Screening.

EXAM:
DIGITAL SCREENING BILATERAL MAMMOGRAM WITH TOMO AND CAD

[L MLO synth-2D (1 of 2)]
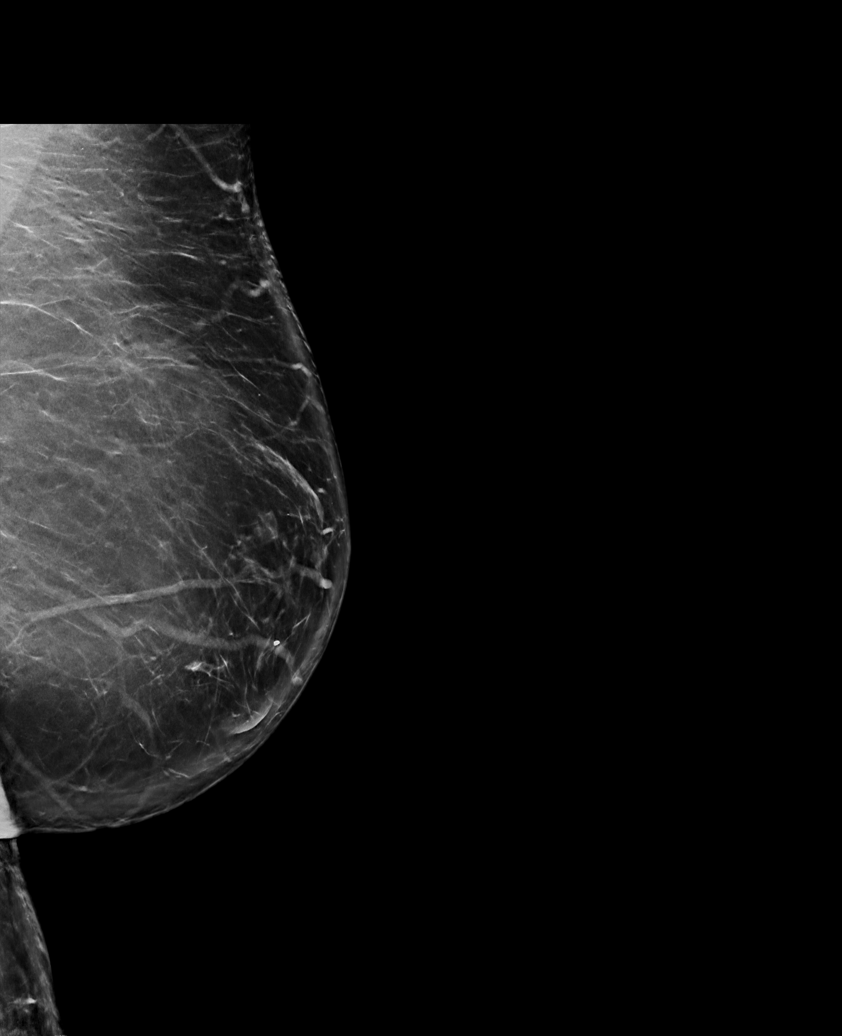

[L CC synth-2D]
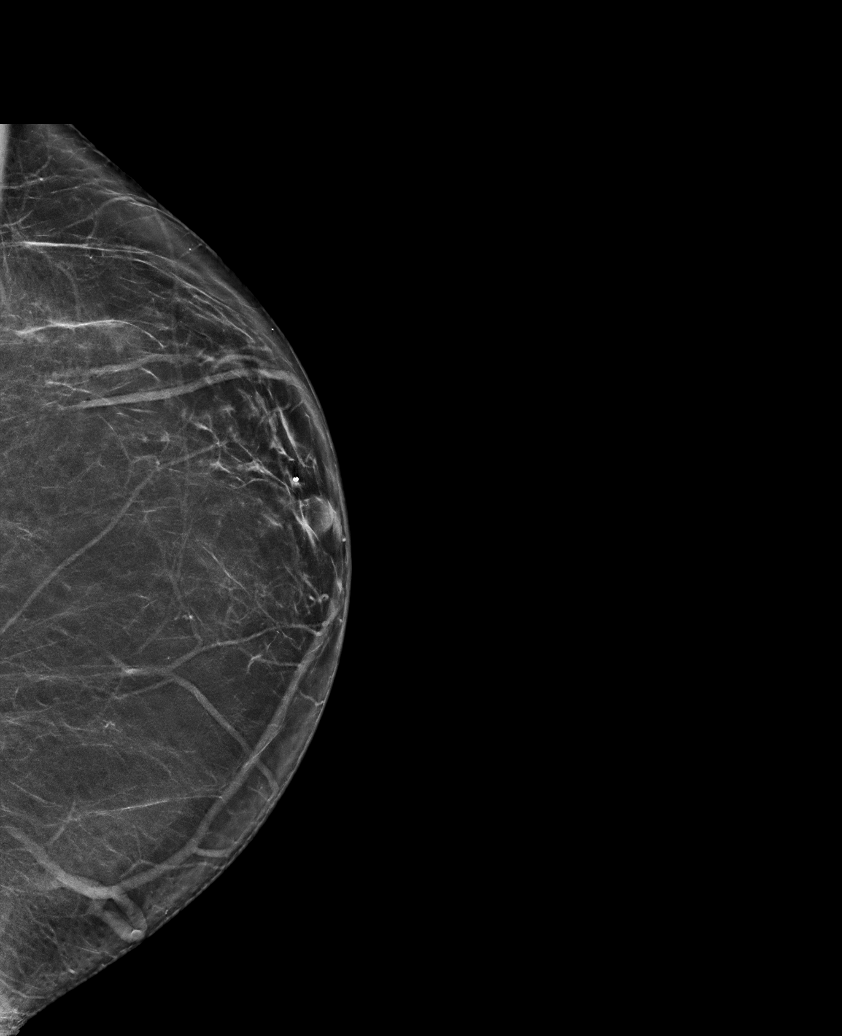

[R CV synth-2D]
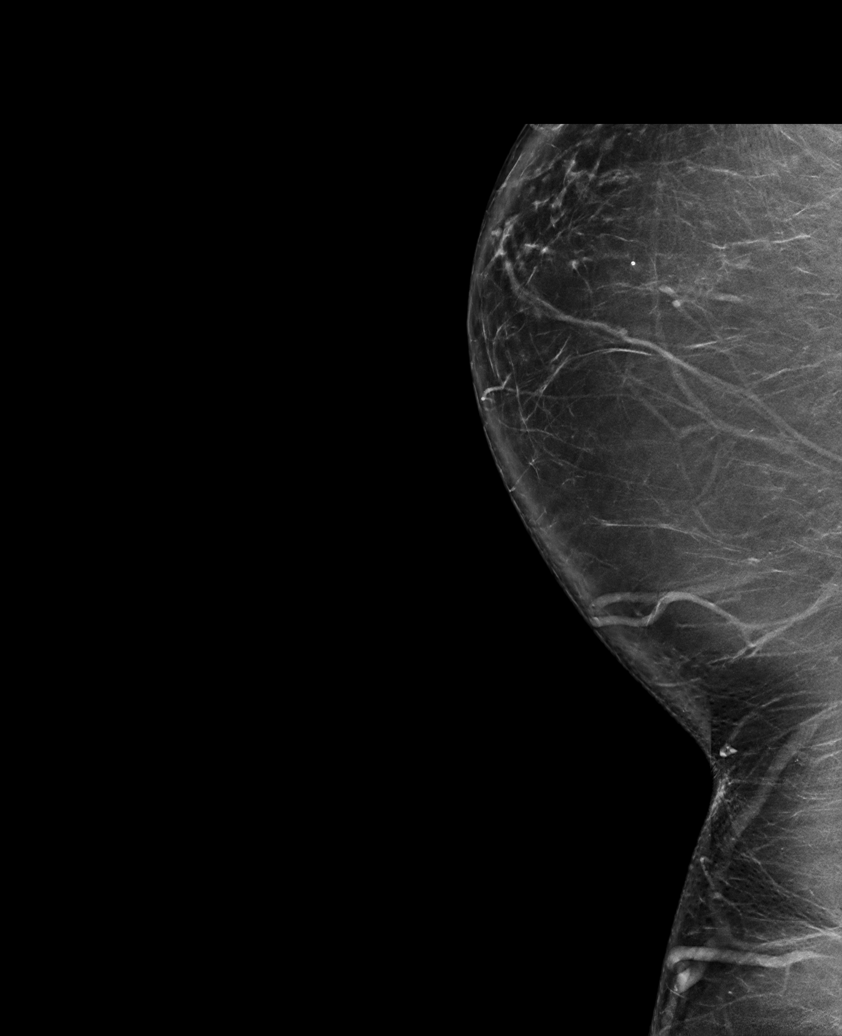

[R CC synth-2D]
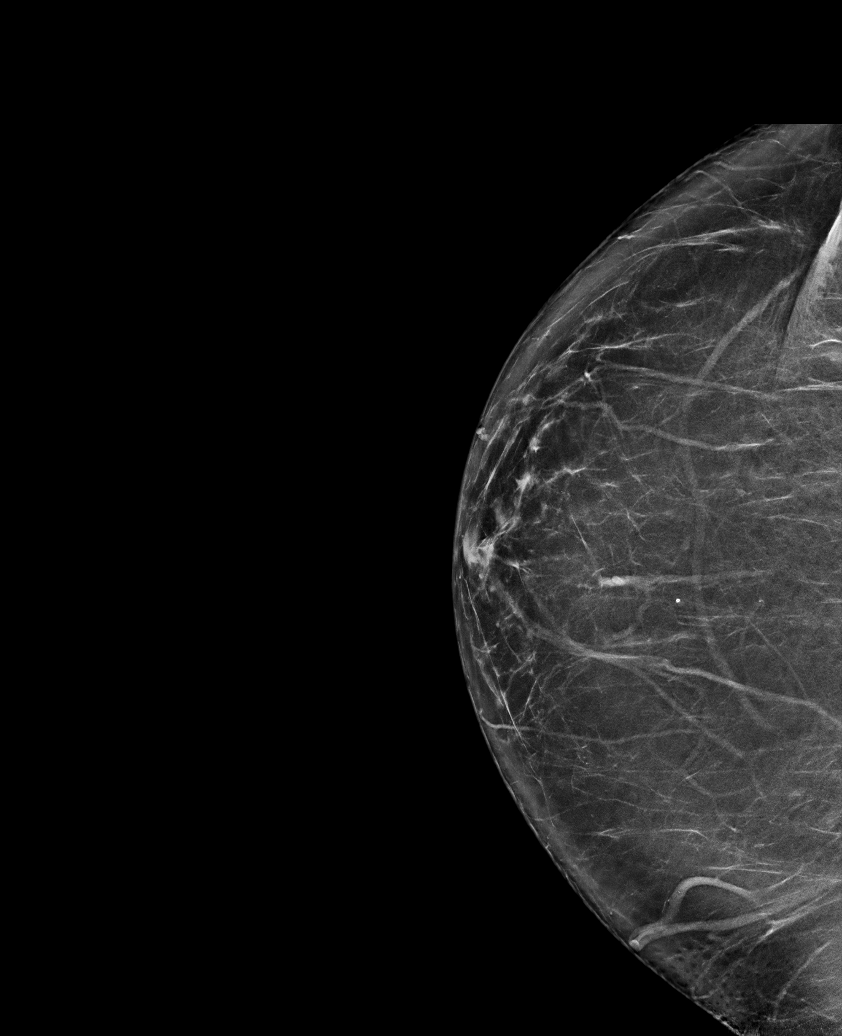

[R MLO synth-2D]
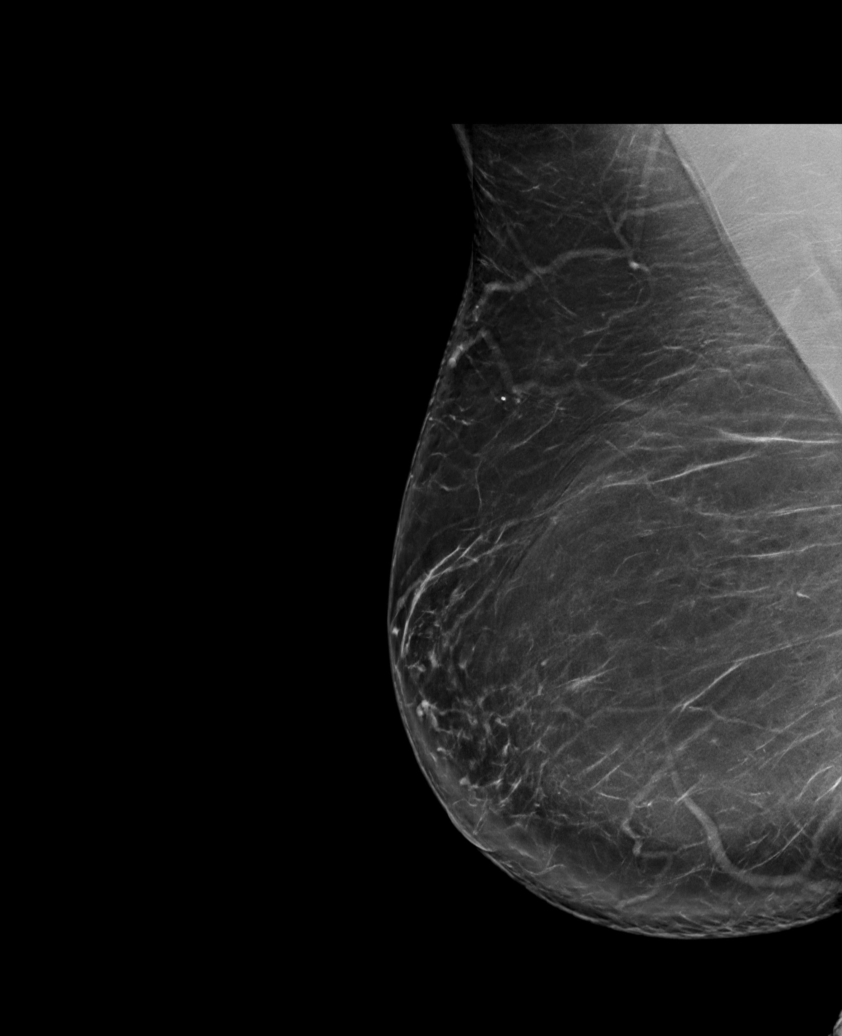

[L MLO synth-2D (2 of 2)]
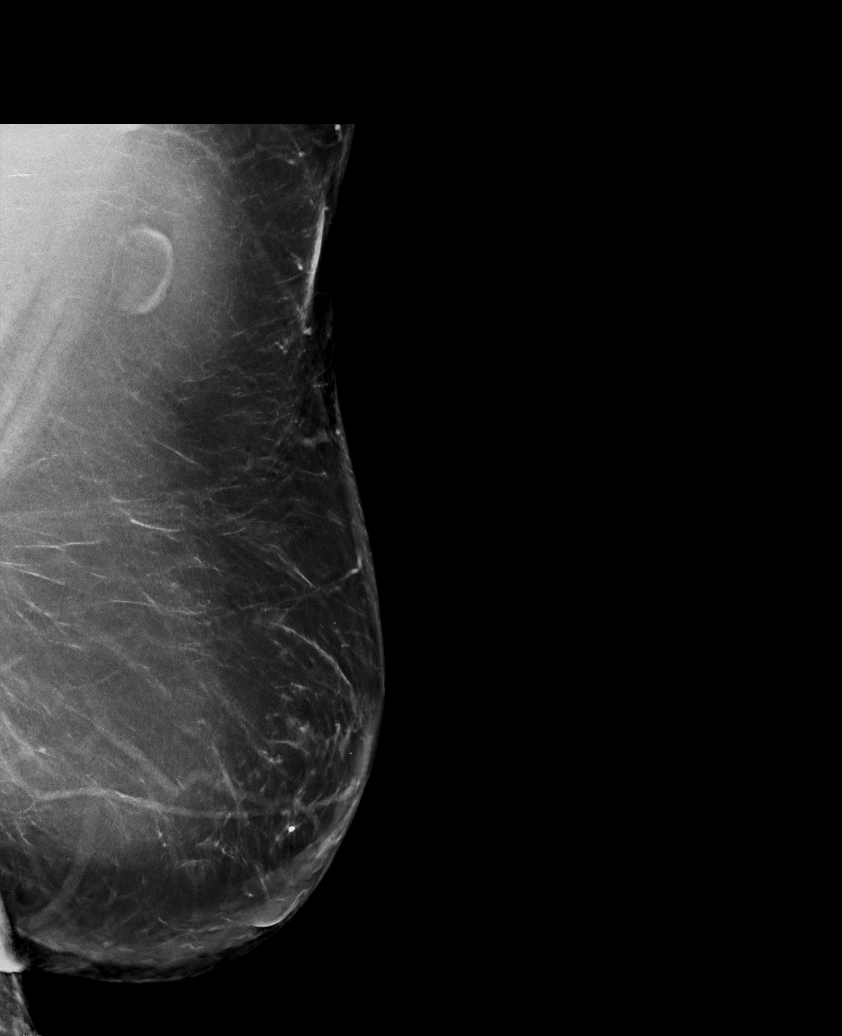

[6 of 36 positions shown; findings below may reference images not displayed]

FINDINGS: There are no findings suspicious for malignancy. Images were
processed with CAD.
IMPRESSION: No mammographic evidence of malignancy. A result letter of this
screening mammogram will be mailed directly to the patient.

RECOMMENDATION:
Screening mammogram in one year. (Code:8Y-[id number])

BI-RADS CATEGORY  1: Negative.

## 2021-12-04 DIAGNOSIS — C44621 Squamous cell carcinoma of skin of unspecified upper limb, including shoulder: Secondary | ICD-10-CM | POA: Diagnosis not present

## 2021-12-04 DIAGNOSIS — D696 Thrombocytopenia, unspecified: Secondary | ICD-10-CM | POA: Diagnosis not present

## 2021-12-04 DIAGNOSIS — J45909 Unspecified asthma, uncomplicated: Secondary | ICD-10-CM | POA: Diagnosis not present

## 2021-12-04 DIAGNOSIS — Z1159 Encounter for screening for other viral diseases: Secondary | ICD-10-CM | POA: Diagnosis not present

## 2021-12-04 DIAGNOSIS — K760 Fatty (change of) liver, not elsewhere classified: Secondary | ICD-10-CM | POA: Diagnosis not present

## 2021-12-04 DIAGNOSIS — E039 Hypothyroidism, unspecified: Secondary | ICD-10-CM | POA: Diagnosis not present

## 2021-12-04 DIAGNOSIS — D509 Iron deficiency anemia, unspecified: Secondary | ICD-10-CM | POA: Diagnosis not present

## 2021-12-04 DIAGNOSIS — E1169 Type 2 diabetes mellitus with other specified complication: Secondary | ICD-10-CM | POA: Diagnosis not present

## 2021-12-04 DIAGNOSIS — Z0000 Encounter for general adult medical examination without abnormal findings: Secondary | ICD-10-CM | POA: Diagnosis not present

## 2021-12-04 DIAGNOSIS — E538 Deficiency of other specified B group vitamins: Secondary | ICD-10-CM | POA: Diagnosis not present

## 2022-01-24 DIAGNOSIS — L853 Xerosis cutis: Secondary | ICD-10-CM | POA: Diagnosis not present

## 2022-01-24 DIAGNOSIS — M1990 Unspecified osteoarthritis, unspecified site: Secondary | ICD-10-CM | POA: Diagnosis not present

## 2022-02-05 DIAGNOSIS — M25551 Pain in right hip: Secondary | ICD-10-CM | POA: Diagnosis not present

## 2022-02-06 ENCOUNTER — Other Ambulatory Visit: Payer: Self-pay | Admitting: Sports Medicine

## 2022-02-06 ENCOUNTER — Ambulatory Visit: Payer: Medicare Other | Admitting: Obstetrics & Gynecology

## 2022-02-06 DIAGNOSIS — M25551 Pain in right hip: Secondary | ICD-10-CM

## 2022-02-28 DIAGNOSIS — J45909 Unspecified asthma, uncomplicated: Secondary | ICD-10-CM | POA: Diagnosis not present

## 2022-02-28 DIAGNOSIS — M62838 Other muscle spasm: Secondary | ICD-10-CM | POA: Diagnosis not present

## 2022-02-28 DIAGNOSIS — Z6841 Body Mass Index (BMI) 40.0 and over, adult: Secondary | ICD-10-CM | POA: Diagnosis not present

## 2022-02-28 DIAGNOSIS — E11620 Type 2 diabetes mellitus with diabetic dermatitis: Secondary | ICD-10-CM | POA: Diagnosis not present

## 2022-02-28 DIAGNOSIS — M1990 Unspecified osteoarthritis, unspecified site: Secondary | ICD-10-CM | POA: Diagnosis not present

## 2022-02-28 DIAGNOSIS — K219 Gastro-esophageal reflux disease without esophagitis: Secondary | ICD-10-CM | POA: Diagnosis not present

## 2022-02-28 DIAGNOSIS — N3946 Mixed incontinence: Secondary | ICD-10-CM | POA: Diagnosis not present

## 2022-02-28 DIAGNOSIS — C9590 Leukemia, unspecified not having achieved remission: Secondary | ICD-10-CM | POA: Diagnosis not present

## 2022-02-28 DIAGNOSIS — E876 Hypokalemia: Secondary | ICD-10-CM | POA: Diagnosis not present

## 2022-02-28 DIAGNOSIS — R030 Elevated blood-pressure reading, without diagnosis of hypertension: Secondary | ICD-10-CM | POA: Diagnosis not present

## 2022-02-28 DIAGNOSIS — E890 Postprocedural hypothyroidism: Secondary | ICD-10-CM | POA: Diagnosis not present

## 2022-03-02 ENCOUNTER — Other Ambulatory Visit: Payer: Medicare HMO

## 2022-03-09 ENCOUNTER — Ambulatory Visit
Admission: RE | Admit: 2022-03-09 | Discharge: 2022-03-09 | Disposition: A | Discharge status: Home / Self Care | Payer: Medicare HMO | Source: Ambulatory Visit | Attending: Sports Medicine | Admitting: Sports Medicine

## 2022-03-09 DIAGNOSIS — R300 Dysuria: Secondary | ICD-10-CM | POA: Diagnosis not present

## 2022-03-09 DIAGNOSIS — M25551 Pain in right hip: Secondary | ICD-10-CM

## 2022-03-09 DIAGNOSIS — L0889 Other specified local infections of the skin and subcutaneous tissue: Secondary | ICD-10-CM | POA: Diagnosis not present

## 2022-03-21 ENCOUNTER — Other Ambulatory Visit: Payer: Self-pay | Admitting: Family Medicine

## 2022-03-21 DIAGNOSIS — E2839 Other primary ovarian failure: Secondary | ICD-10-CM

## 2022-03-29 ENCOUNTER — Other Ambulatory Visit: Payer: Self-pay | Admitting: Family Medicine

## 2022-03-29 DIAGNOSIS — Z1231 Encounter for screening mammogram for malignant neoplasm of breast: Secondary | ICD-10-CM

## 2022-04-24 DIAGNOSIS — R0789 Other chest pain: Secondary | ICD-10-CM | POA: Diagnosis not present

## 2022-04-24 DIAGNOSIS — E1169 Type 2 diabetes mellitus with other specified complication: Secondary | ICD-10-CM | POA: Diagnosis not present

## 2022-05-02 DIAGNOSIS — Z1331 Encounter for screening for depression: Secondary | ICD-10-CM | POA: Diagnosis not present

## 2022-05-02 DIAGNOSIS — E538 Deficiency of other specified B group vitamins: Secondary | ICD-10-CM | POA: Diagnosis not present

## 2022-05-02 DIAGNOSIS — E7800 Pure hypercholesterolemia, unspecified: Secondary | ICD-10-CM | POA: Diagnosis not present

## 2022-05-02 DIAGNOSIS — E8889 Other specified metabolic disorders: Secondary | ICD-10-CM | POA: Diagnosis not present

## 2022-05-02 DIAGNOSIS — Z90722 Acquired absence of ovaries, bilateral: Secondary | ICD-10-CM | POA: Diagnosis not present

## 2022-05-02 DIAGNOSIS — J45909 Unspecified asthma, uncomplicated: Secondary | ICD-10-CM | POA: Diagnosis not present

## 2022-05-02 DIAGNOSIS — D509 Iron deficiency anemia, unspecified: Secondary | ICD-10-CM | POA: Diagnosis not present

## 2022-05-02 DIAGNOSIS — Z6841 Body Mass Index (BMI) 40.0 and over, adult: Secondary | ICD-10-CM | POA: Diagnosis not present

## 2022-05-02 DIAGNOSIS — E039 Hypothyroidism, unspecified: Secondary | ICD-10-CM | POA: Diagnosis not present

## 2022-05-02 DIAGNOSIS — R5383 Other fatigue: Secondary | ICD-10-CM | POA: Diagnosis not present

## 2022-05-02 DIAGNOSIS — Z856 Personal history of leukemia: Secondary | ICD-10-CM | POA: Diagnosis not present

## 2022-05-02 DIAGNOSIS — E1169 Type 2 diabetes mellitus with other specified complication: Secondary | ICD-10-CM | POA: Diagnosis not present

## 2022-05-23 DIAGNOSIS — E039 Hypothyroidism, unspecified: Secondary | ICD-10-CM | POA: Diagnosis not present

## 2022-05-23 DIAGNOSIS — J45909 Unspecified asthma, uncomplicated: Secondary | ICD-10-CM | POA: Diagnosis not present

## 2022-05-23 DIAGNOSIS — Z72820 Sleep deprivation: Secondary | ICD-10-CM | POA: Diagnosis not present

## 2022-05-23 DIAGNOSIS — E538 Deficiency of other specified B group vitamins: Secondary | ICD-10-CM | POA: Diagnosis not present

## 2022-05-23 DIAGNOSIS — Z856 Personal history of leukemia: Secondary | ICD-10-CM | POA: Diagnosis not present

## 2022-05-23 DIAGNOSIS — E1169 Type 2 diabetes mellitus with other specified complication: Secondary | ICD-10-CM | POA: Diagnosis not present

## 2022-05-23 DIAGNOSIS — M1611 Unilateral primary osteoarthritis, right hip: Secondary | ICD-10-CM | POA: Diagnosis not present

## 2022-05-23 DIAGNOSIS — E7800 Pure hypercholesterolemia, unspecified: Secondary | ICD-10-CM | POA: Diagnosis not present

## 2022-05-23 DIAGNOSIS — D509 Iron deficiency anemia, unspecified: Secondary | ICD-10-CM | POA: Diagnosis not present

## 2022-05-23 DIAGNOSIS — K219 Gastro-esophageal reflux disease without esophagitis: Secondary | ICD-10-CM | POA: Diagnosis not present

## 2022-05-23 DIAGNOSIS — K580 Irritable bowel syndrome with diarrhea: Secondary | ICD-10-CM | POA: Diagnosis not present

## 2022-06-07 DIAGNOSIS — M1611 Unilateral primary osteoarthritis, right hip: Secondary | ICD-10-CM | POA: Diagnosis not present

## 2022-06-07 DIAGNOSIS — K760 Fatty (change of) liver, not elsewhere classified: Secondary | ICD-10-CM | POA: Diagnosis not present

## 2022-06-07 DIAGNOSIS — Z23 Encounter for immunization: Secondary | ICD-10-CM | POA: Diagnosis not present

## 2022-06-07 DIAGNOSIS — E1169 Type 2 diabetes mellitus with other specified complication: Secondary | ICD-10-CM | POA: Diagnosis not present

## 2022-06-07 DIAGNOSIS — D649 Anemia, unspecified: Secondary | ICD-10-CM | POA: Diagnosis not present

## 2022-06-07 DIAGNOSIS — M1990 Unspecified osteoarthritis, unspecified site: Secondary | ICD-10-CM | POA: Diagnosis not present

## 2022-06-12 ENCOUNTER — Telehealth: Payer: Self-pay | Admitting: Hematology and Oncology

## 2022-06-12 NOTE — Telephone Encounter (Signed)
Scheduled appt per 9/19 referral. Pt already established with Dr. Alvy Bimler. Pt is aware of appt date and time. Pt is aware to arrive 15 mins prior to appt time and to bring and updated insurance card. Pt is aware of appt location.

## 2022-06-20 DIAGNOSIS — D696 Thrombocytopenia, unspecified: Secondary | ICD-10-CM | POA: Diagnosis not present

## 2022-06-20 DIAGNOSIS — K580 Irritable bowel syndrome with diarrhea: Secondary | ICD-10-CM | POA: Diagnosis not present

## 2022-06-20 DIAGNOSIS — E1169 Type 2 diabetes mellitus with other specified complication: Secondary | ICD-10-CM | POA: Diagnosis not present

## 2022-06-20 DIAGNOSIS — Z6841 Body Mass Index (BMI) 40.0 and over, adult: Secondary | ICD-10-CM | POA: Diagnosis not present

## 2022-06-22 ENCOUNTER — Encounter: Payer: Self-pay | Admitting: Hematology and Oncology

## 2022-06-22 ENCOUNTER — Inpatient Hospital Stay: Payer: Medicare HMO | Attending: Hematology and Oncology | Admitting: Hematology and Oncology

## 2022-06-22 VITALS — BP 129/55 | HR 89 | Temp 97.7°F | Resp 18 | Ht 63.25 in | Wt 315.4 lb

## 2022-06-22 DIAGNOSIS — E538 Deficiency of other specified B group vitamins: Secondary | ICD-10-CM

## 2022-06-22 DIAGNOSIS — K760 Fatty (change of) liver, not elsewhere classified: Secondary | ICD-10-CM | POA: Insufficient documentation

## 2022-06-22 DIAGNOSIS — Z856 Personal history of leukemia: Secondary | ICD-10-CM | POA: Insufficient documentation

## 2022-06-22 DIAGNOSIS — D696 Thrombocytopenia, unspecified: Secondary | ICD-10-CM | POA: Diagnosis not present

## 2022-06-22 DIAGNOSIS — Z79899 Other long term (current) drug therapy: Secondary | ICD-10-CM | POA: Diagnosis not present

## 2022-06-22 DIAGNOSIS — K769 Liver disease, unspecified: Secondary | ICD-10-CM | POA: Diagnosis not present

## 2022-06-22 NOTE — Progress Notes (Signed)
[facility] OFFICE PROGRESS NOTE  Patient Care Team: Kelton Pillar, MD as PCP - General (Family Medicine) Ronald Lobo, MD as Consulting Physician (Gastroenterology) Huel Cote, NP (Inactive) as Nurse Practitioner (Obstetrics and Gynecology) Luberta Mutter, MD as Consulting Physician (Ophthalmology) Heath Lark, MD as Consulting Physician (Hematology and Oncology)  ASSESSMENT & PLAN:  History of acute lymphoblastic leukemia (ALL) Based on her recent blood work, she have no signs of recurrence I do not believe her thrombocytopenia is due to this   Thrombocytopenia [facility]) She has slight worsening thrombocytopenia Her most recent blood work from September 14 came back low at 70,000, approximately a month ago, was 89,000 She is known to have fatty liver disease I am wondering whether she might have liver cirrhosis with splenomegaly I will order CT imaging for review If CT imaging is nondiagnostic, I will have to order bone marrow biopsy  Vitamin B12 deficiency Her recent outside records indicated normal serum vitamin B12 level She does not need repeat blood work in this regard  Orders Placed This Encounter  Procedures   CT ABDOMEN PELVIS W CONTRAST    Standing Status:   Future    Standing Expiration Date:   06/23/2023    Order Specific Question:   If indicated for the ordered procedure, I authorize the administration of contrast media per Radiology protocol    Answer:   Yes    Order Specific Question:   Preferred imaging location?    Answer:   [facility]    Order Specific Question:   Radiology Contrast Protocol - do NOT remove file path    Answer:   [email or web address]    Order Specific Question:   Is patient pregnant?    Answer:   No    All questions were answered. The patient knows to call the clinic with any problems, questions or concerns. The total time spent in the appointment was 30 minutes  encounter with patients including review of chart and various tests results, discussions about plan of care and coordination of care plan   Heath Lark, MD 06/22/2022 5:27 PM  INTERVAL HISTORY: Please see below for problem oriented charting. she returns for urgent evaluation due to worsening thrombocytopenia I have received records from her primary care doctor She had blood work done recently in August Her blood count indicated worsening thrombocytopenia with a platelet count of 81,000 She was also borderline anemic and she started taking oral iron supplement While her anemia improves a month later, unfortunately, her platelet count drops further to 70,000 She is being referred here for further evaluation She is known to have fatty liver disease with borderline abnormal liver enzymes She denies recent bleeding such as nosebleeds hematuria or hematochezia No new herbal supplements  REVIEW OF SYSTEMS:   Constitutional: Denies fevers, chills or abnormal weight loss Eyes: Denies blurriness of vision Ears, nose, mouth, throat, and face: Denies mucositis or sore throat Respiratory: Denies cough, dyspnea or wheezes Cardiovascular: Denies palpitation, chest discomfort or lower extremity swelling Gastrointestinal:  Denies nausea, heartburn or change in bowel habits Skin: Denies abnormal skin rashes Lymphatics: Denies new lymphadenopathy or easy bruising Neurological:Denies numbness, tingling or new weaknesses Behavioral/Psych: Mood is stable, no new changes  All other systems were reviewed with the patient and are negative.  I have reviewed the past medical history, past surgical history, social history and family history with the patient and they are unchanged from previous note.  ALLERGIES:  is allergic to breo ellipta [fluticasone furoate-vilanterol],  erythromycin, penicillins, quinine derivatives, and sulfa antibiotics.  MEDICATIONS:  Current Outpatient Medications  Medication Sig  Dispense Refill   ferrous sulfate 325 (65 FE) MG tablet Take 325 mg by mouth every other day.     Cranberry-Vitamin C-Vitamin E (CRANBERRY PLUS VITAMIN C PO) Take 1 tablet by mouth daily.      cyclobenzaprine (FLEXERIL) 10 MG tablet Take 1 tablet (10 mg total) by mouth 3 (three) times daily as needed for muscle spasms. 90 tablet 3   fexofenadine (ALLEGRA) 180 MG tablet Take 180 mg by mouth daily.      fluticasone (FLONASE) 50 MCG/ACT nasal spray Place 2 sprays into both nostrils daily.     fluticasone (FLOVENT HFA) 110 MCG/ACT inhaler Inhale 1 puff into the lungs 2 (two) times daily.      furosemide (LASIX) 20 MG tablet TAKE 1 TABLET BY MOUTH EVERY MORNING AND 1 TAB AT NOON AS NEEDED FOR EDEMA 180 tablet 1   levothyroxine (SYNTHROID) 75 MCG tablet TAKE 1 TABLET EVERY DAY BEFORE BREAKFAST 90 tablet 1   Magnesium 250 MG TABS Take 250 mg by mouth daily.      Menaquinone-7 (VITAMIN K2) 100 MCG CAPS 1 capsule     Menatetrenone (VITAMIN K2) 100 MCG TABS Take 100 mcg by mouth daily.      montelukast (SINGULAIR) 10 MG tablet Take 10 mg by mouth at bedtime.   5   Multiple Vitamins-Minerals (VITAMIN D3 COMPLETE) TABS 1 capsule     OZEMPIC, 0.25 OR 0.5 MG/DOSE, 2 MG/3ML SOPN Inject 0.25 mg into the skin once a week.     pantoprazole (PROTONIX) 40 MG tablet TAKE 1 TABLET BY MOUTH EVERY DAY 90 tablet 3   potassium chloride (KLOR-CON M10) 10 MEQ tablet TAKE 1 TABLET BY MOUTH TWICE A DAY WHEN TAKING LASIX 180 tablet 1   PROAIR HFA 108 (90 BASE) MCG/ACT inhaler Inhale 1-2 puffs into the lungs every 6 (six) hours as needed for wheezing or shortness of breath.   0   Probiotic Product (PROBIOTIC DAILY PO) Take 1 capsule by mouth daily.      traMADol (ULTRAM) 50 MG tablet Take 1 tablet (50 mg total) by mouth at bedtime as needed. 30 tablet 0   No current facility-administered medications for this visit.    SUMMARY OF ONCOLOGIC HISTORY: Marissa [name] 56 y.o. female is here because of iron deficiency anemia and  thrombocytopenia. 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 Cristina Gong approximately 3 years ago  She was found to have abnormal CBC from routine blood test by her primary care doctor On review of her blood work, in August 2020, her platelet count was low at 90,000 A month later in September, when it was repeated, her platelet count improved to 137,000 By December 2020, her platelet count is normal Most recently, on January 06, 2020, her hemoglobin was 10.1 and platelet count of 111,000.  She was also found to have iron deficiency On review of her medication list, the patient is noted to have been taking NSAID She used to take a lot of NSAID for chronic knee pain, back pain and shoulder pain. She was prescribed pantoprazole for acid reflux that was diagnosed when a trigger her asthma attack She has been  on pantoprazole for approximately 2 years She was also noted to have received antibiotics recently for urinary tract infection  She denies recent bruising/bleeding, such as spontaneous epistaxis, hematuria, melena or hematochezia She denies recent excessive menorrhagia; in fact, she had hysterectomy in 2003 for endometriosis The patient denies history of liver disease, exposure to heparin, history of cardiac murmur/prior cardiovascular surgery or recent new medications She complained of poor energy  She was prescribed oral iron supplements of which she takes daily with food but she is still having issues with chronic iron deficiency anemia On April 01, 2020, she received 1 dose of intravenous iron and B12 supplementation  On April 14, 2020, she underwent EGD and colonoscopy Colonoscopy showed  - Three 4-9 mm polyps in the descending colon, at the  hepatic flexure and in the ascending colon, removed with a cold snare. Resected and retrieved. - The examined portion of the ileum was normal. - The distal rectum and anal verge are normal on retroflexion view.  EGD showed  - 1 cm hiatal hernia. - Bilious gastric fluid. - Multiple gastric polyps. Biopsied. Due to friability and minimal oozing noted, in conjunction with patient's thrombocytopenia and iron malabsorption from PPI therapy, it is possible that this could account for the patient's iron deficiency anemia. - Normal examined duodenum  Pathology report revealed A. PRE-PYLORIC POLYP, POLYPECTOMY:  - Reactive gastropathy, polypoid, ulcerated and inflamed with  necroinflammatory debris and granulation tissue.   B. STOMACH, ANTRUM, POLYPECTOMY:  - Reactive gastropathy, polypoid.   C. STOMACH, GREATER CURVE, POLYPECTOMY:  - Hyperplastic polyp.   D. COLON, ASCENDING, POLYPECTOMY:  - Sessile serrated polyp without cytologic dysplasia.   E. COLON, HEPATIC FLEXURE, POLYPECTOMY:  - Tubular adenoma without high grade dysplasia.   F. COLON, DESECNDING, POLYPECTOMY:  - Tubular adenoma without high grade dysplasia.   PHYSICAL EXAMINATION: ECOG PERFORMANCE STATUS: 1 - Symptomatic but completely ambulatory  Vitals:   06/22/22 1154  BP: (!) 129/55  Pulse: 89  Resp: 18  Temp: 97.7 F (36.5 C)  SpO2: 99%   Filed Weights   06/22/22 1154  Weight: (!) 315 lb 6.4 oz (143.1 kg)    GENERAL:alert, no distress and comfortable NEURO: alert & oriented x 3 with fluent speech, no focal motor/sensory deficits  LABORATORY DATA:  I have reviewed the data as listed    Component Value Date/Time   NA 141 05/09/2021 1339   K 4.1 05/09/2021 1339   CL 102 05/09/2021 1339   CO2 30 05/09/2021 1339   GLUCOSE 96 05/09/2021 1339   BUN 9 05/09/2021 1339   CREATININE 0.60 05/09/2021 1339   CALCIUM 9.1 05/09/2021 1339   PROT 6.5 11/08/2020 1400   AST 24 11/08/2020 1400   ALT 18 11/08/2020  1400   BILITOT 1.2 11/08/2020 1400   GFRNONAA >90 09/14/2013 0950   GFRAA >90 09/14/2013 0950    No results found for: "SPEP", "UPEP"  Lab Results  Component Value Date   WBC 5.6 05/09/2021   NEUTROABS 3,584 05/09/2021   HGB 12.8 05/09/2021   HCT 39.2 05/09/2021   MCV 87.9 05/09/2021   PLT 101 (L) 05/09/2021      Chemistry      Component Value Date/Time   NA 141 05/09/2021 1339   K 4.1 05/09/2021 1339   CL 102 05/09/2021 1339   CO2 30 05/09/2021 1339   BUN 9 05/09/2021 1339   CREATININE 0.60 05/09/2021 1339      Component Value Date/Time   CALCIUM 9.1 05/09/2021  1339   AST 24 11/08/2020 1400   ALT 18 11/08/2020 1400   BILITOT 1.2 11/08/2020 1400

## 2022-06-22 NOTE — Assessment & Plan Note (Signed)
Her recent outside records indicated normal serum vitamin B12 level She does not need repeat blood work in this regard

## 2022-06-22 NOTE — Assessment & Plan Note (Signed)
She has slight worsening thrombocytopenia Her most recent blood work from September 14 came back low at 70,000, approximately a month ago, was 89,000 She is known to have fatty liver disease I am wondering whether she might have liver cirrhosis with splenomegaly I will order CT imaging for review If CT imaging is nondiagnostic, I will have to order bone marrow biopsy

## 2022-06-22 NOTE — Assessment & Plan Note (Signed)
Based on her recent blood work, she have no signs of recurrence I do not believe her thrombocytopenia is due to this

## 2022-06-26 ENCOUNTER — Encounter: Payer: Self-pay | Admitting: Hematology and Oncology

## 2022-07-03 ENCOUNTER — Encounter (HOSPITAL_COMMUNITY): Payer: Self-pay

## 2022-07-03 ENCOUNTER — Ambulatory Visit (HOSPITAL_COMMUNITY)
Admission: RE | Admit: 2022-07-03 | Discharge: 2022-07-03 | Disposition: A | Discharge status: Home / Self Care | Payer: Medicare HMO | Source: Ambulatory Visit | Attending: Hematology and Oncology | Admitting: Hematology and Oncology

## 2022-07-03 DIAGNOSIS — K766 Portal hypertension: Secondary | ICD-10-CM | POA: Diagnosis not present

## 2022-07-03 DIAGNOSIS — R161 Splenomegaly, not elsewhere classified: Secondary | ICD-10-CM | POA: Diagnosis not present

## 2022-07-03 DIAGNOSIS — K7460 Unspecified cirrhosis of liver: Secondary | ICD-10-CM | POA: Diagnosis not present

## 2022-07-03 DIAGNOSIS — D696 Thrombocytopenia, unspecified: Secondary | ICD-10-CM | POA: Diagnosis not present

## 2022-07-03 DIAGNOSIS — Z856 Personal history of leukemia: Secondary | ICD-10-CM

## 2022-07-03 DIAGNOSIS — K769 Liver disease, unspecified: Secondary | ICD-10-CM

## 2022-07-03 MED ORDER — SODIUM CHLORIDE (PF) 0.9 % IJ SOLN
INTRAMUSCULAR | Status: AC
  Filled 2022-07-03: qty 50

## 2022-07-03 MED ORDER — IOHEXOL 300 MG/ML  SOLN
100.0000 mL | Freq: Once | INTRAMUSCULAR | Status: AC | PRN
  Administered 2022-07-03: 100 mL via INTRAVENOUS

## 2022-07-05 ENCOUNTER — Encounter: Payer: Self-pay | Admitting: Hematology and Oncology

## 2022-07-05 ENCOUNTER — Inpatient Hospital Stay: Payer: Medicare HMO | Attending: Hematology and Oncology | Admitting: Hematology and Oncology

## 2022-07-05 VITALS — BP 135/62 | HR 84 | Temp 97.7°F | Resp 18 | Ht 63.25 in | Wt 310.2 lb

## 2022-07-05 DIAGNOSIS — D696 Thrombocytopenia, unspecified: Secondary | ICD-10-CM | POA: Diagnosis not present

## 2022-07-05 DIAGNOSIS — Z856 Personal history of leukemia: Secondary | ICD-10-CM

## 2022-07-05 DIAGNOSIS — I8510 Secondary esophageal varices without bleeding: Secondary | ICD-10-CM | POA: Insufficient documentation

## 2022-07-05 DIAGNOSIS — Z6841 Body Mass Index (BMI) 40.0 and over, adult: Secondary | ICD-10-CM

## 2022-07-05 DIAGNOSIS — K7460 Unspecified cirrhosis of liver: Secondary | ICD-10-CM | POA: Diagnosis not present

## 2022-07-05 NOTE — Assessment & Plan Note (Signed)
She is currently being followed [facility] I will defer to them for further management

## 2022-07-05 NOTE — Assessment & Plan Note (Signed)
Based on her recent blood work, she have no signs of recurrence I do not believe her thrombocytopenia is due to this

## 2022-07-05 NOTE — Progress Notes (Signed)
[facility] OFFICE PROGRESS NOTE  Patient Care Team: Collene Leyden, MD as PCP - General (Family Medicine) Luberta Mutter, MD as Consulting Physician (Ophthalmology) Heath Lark, MD as Consulting Physician (Hematology and Oncology) Princess Bruins, MD as Consulting Physician (Obstetrics and Gynecology)  ASSESSMENT & PLAN:  History of acute lymphoblastic leukemia (ALL) Based on her recent blood work, she have no signs of recurrence I do not believe her thrombocytopenia is due to this   Liver cirrhosis [facility]) I reviewed her CT imaging She has signs of liver cirrhosis and varices, likely due to obesity I recommend GI referral I told the patient this is the most likely cause of her thrombocytopenia She does not need long-term follow-up with me in this regard  Class 3 severe obesity without serious comorbidity with body mass index (BMI) of 50.0 to 59.9 in adult [facility]) She is currently being followed at the medical weight management center I will defer to them for further management  Orders Placed This Encounter  Procedures   Ambulatory referral to Gastroenterology    Referral Priority:   Routine    Referral Type:   Consultation    Referral Reason:   Specialty Services Required    Referred to Provider:   Arta Silence, MD    Requested Specialty:   Gastroenterology    Number of Visits Requested:   1    All questions were answered. The patient knows to call the clinic with any problems, questions or concerns. The total time spent in the appointment was 30 minutes encounter with patients including review of chart and various tests results, discussions about plan of care and coordination of care plan   Heath Lark, MD 07/05/2022 1:48 PM  INTERVAL HISTORY: Please see below for problem oriented charting. she returns for return visit She has no recent bleeding or bruising We discussed recent CT findings  REVIEW OF SYSTEMS:   Constitutional: Denies fevers, chills  or abnormal weight loss Eyes: Denies blurriness of vision Ears, nose, mouth, throat, and face: Denies mucositis or sore throat Respiratory: Denies cough, dyspnea or wheezes Cardiovascular: Denies palpitation, chest discomfort or lower extremity swelling Gastrointestinal:  Denies nausea, heartburn or change in bowel habits Skin: Denies abnormal skin rashes Lymphatics: Denies new lymphadenopathy or easy bruising Neurological:Denies numbness, tingling or new weaknesses Behavioral/Psych: Mood is stable, no new changes  All other systems were reviewed with the patient and are negative.  I have reviewed the past medical history, past surgical history, social history and family history with the patient and they are unchanged from previous note.  ALLERGIES:  is allergic to breo ellipta [fluticasone furoate-vilanterol], erythromycin, penicillins, quinine derivatives, and sulfa antibiotics.  MEDICATIONS:  Current Outpatient Medications  Medication Sig Dispense Refill   Cranberry-Vitamin C-Vitamin E (CRANBERRY PLUS VITAMIN C PO) Take 1 tablet by mouth daily.      cyclobenzaprine (FLEXERIL) 10 MG tablet Take 1 tablet (10 mg total) by mouth 3 (three) times daily as needed for muscle spasms. 90 tablet 3   ferrous sulfate 325 (65 FE) MG tablet Take 325 mg by mouth every other day.     fexofenadine (ALLEGRA) 180 MG tablet Take 180 mg by mouth daily.      fluticasone (FLONASE) 50 MCG/ACT nasal spray Place 2 sprays into both nostrils daily.     fluticasone (FLOVENT HFA) 110 MCG/ACT inhaler Inhale 1 puff into the lungs 2 (two) times daily.      furosemide (LASIX) 20 MG tablet TAKE 1 TABLET BY MOUTH EVERY  MORNING AND 1 TAB AT NOON AS NEEDED FOR EDEMA 180 tablet 1   levothyroxine (SYNTHROID) 75 MCG tablet TAKE 1 TABLET EVERY DAY BEFORE BREAKFAST 90 tablet 1   Magnesium 250 MG TABS Take 250 mg by mouth daily.      Menaquinone-7 (VITAMIN K2) 100 MCG CAPS 1 capsule     montelukast (SINGULAIR) 10 MG tablet Take  10 mg by mouth at bedtime.   5   Multiple Vitamins-Minerals (VITAMIN D3 COMPLETE) TABS 1 capsule     OZEMPIC, 0.25 OR 0.5 MG/DOSE, 2 MG/3ML SOPN Inject 0.5 mg into the skin once a week.     pantoprazole (PROTONIX) 40 MG tablet TAKE 1 TABLET BY MOUTH EVERY DAY 90 tablet 3   potassium chloride (KLOR-CON M10) 10 MEQ tablet TAKE 1 TABLET BY MOUTH TWICE A DAY WHEN TAKING LASIX 180 tablet 1   PROAIR HFA 108 (90 BASE) MCG/ACT inhaler Inhale 1-2 puffs into the lungs every 6 (six) hours as needed for wheezing or shortness of breath.   0   Probiotic Product (PROBIOTIC DAILY PO) Take 1 capsule by mouth daily.      traMADol (ULTRAM) 50 MG tablet Take 1 tablet (50 mg total) by mouth at bedtime as needed. (Patient taking differently: Take 50 mg by mouth at bedtime as needed. Takes 100 mg at night) 30 tablet 0   No current facility-administered medications for this visit.    SUMMARY OF ONCOLOGIC HISTORY: Oncology History   No history exists.    PHYSICAL EXAMINATION: ECOG PERFORMANCE STATUS: 1 - Symptomatic but completely ambulatory  Vitals:   07/05/22 1313  BP: 135/62  Pulse: 84  Resp: 18  Temp: 97.7 F (36.5 C)  SpO2: 97%   Filed Weights   07/05/22 1313  Weight: (!) 310 lb 3.2 oz (140.7 kg)    GENERAL:alert, no distress and comfortable NEURO: alert & oriented x 3 with fluent speech, no focal motor/sensory deficits  LABORATORY DATA:  I have reviewed the data as listed    Component Value Date/Time   NA 141 05/09/2021 1339   K 4.1 05/09/2021 1339   CL 102 05/09/2021 1339   CO2 30 05/09/2021 1339   GLUCOSE 96 05/09/2021 1339   BUN 9 05/09/2021 1339   CREATININE 0.60 05/09/2021 1339   CALCIUM 9.1 05/09/2021 1339   PROT 6.5 11/08/2020 1400   AST 24 11/08/2020 1400   ALT 18 11/08/2020 1400   BILITOT 1.2 11/08/2020 1400   GFRNONAA >90 09/14/2013 0950   GFRAA >90 09/14/2013 0950    No results found for: "SPEP", "UPEP"  Lab Results  Component Value Date   WBC 5.6 05/09/2021    NEUTROABS 3,584 05/09/2021   HGB 12.8 05/09/2021   HCT 39.2 05/09/2021   MCV 87.9 05/09/2021   PLT 101 (L) 05/09/2021      Chemistry      Component Value Date/Time   NA 141 05/09/2021 1339   K 4.1 05/09/2021 1339   CL 102 05/09/2021 1339   CO2 30 05/09/2021 1339   BUN 9 05/09/2021 1339   CREATININE 0.60 05/09/2021 1339      Component Value Date/Time   CALCIUM 9.1 05/09/2021 1339   AST 24 11/08/2020 1400   ALT 18 11/08/2020 1400   BILITOT 1.2 11/08/2020 1400       RADIOGRAPHIC STUDIES: I have personally reviewed the radiological images as listed and agreed with the findings in the report. CT ABDOMEN PELVIS W CONTRAST  Result Date: 07/04/2022 CLINICAL DATA:  Liver disease, thrombocytopenia, ALL. * Tracking Code: BO * EXAM: CT ABDOMEN AND PELVIS WITH CONTRAST TECHNIQUE: Multidetector CT imaging of the abdomen and pelvis was performed using the standard protocol following bolus administration of intravenous contrast. RADIATION DOSE REDUCTION: This exam was performed according to the departmental dose-optimization program which includes automated exposure control, adjustment of the mA and/or kV according to patient size and/or use of iterative reconstruction technique. CONTRAST:  162m OMNIPAQUE IOHEXOL 300 MG/ML  SOLN COMPARISON:  None Available. FINDINGS: Lower chest: Lung bases are clear. Heart is at the upper limits of normal in size to mildly enlarged. No pericardial or pleural effusion. Distal esophagus is grossly unremarkable. Gastroesophageal varices are seen. Hepatobiliary: Liver margin is irregular. Recanalized paraumbilical vein. Liver and gallbladder are otherwise unremarkable. No biliary ductal dilatation. Pancreas: Negative. Spleen: Enlarged, 20.3 cm. Adrenals/Urinary Tract: Adrenal glands are unremarkable. Subcentimeter lesions off the right kidney, too small to characterize. No specific follow-up necessary. Kidneys are otherwise unremarkable. Ureters are decompressed.  Bladder is relatively low in volume. Stomach/Bowel: Stomach, small bowel and colon unremarkable. Appendix is not visualized. Vascular/Lymphatic: Extensive varices. Atherosclerotic calcification of the aorta. No pathologically enlarged lymph nodes. Reproductive: Hysterectomy.  No adnexal mass. Other: No free fluid.  Mesenteries and peritoneum are unremarkable. Musculoskeletal: Degenerative changes in the spine. No worrisome lytic or sclerotic lesions. IMPRESSION: Cirrhosis with portal hypertension. If further evaluation for hepatocellular carcinoma is desired, MR abdomen without and with contrast is recommended. Electronically Signed   By: MLorin PicketM.D.   On: 07/04/2022 16:10

## 2022-07-05 NOTE — Assessment & Plan Note (Addendum)
I reviewed her CT imaging She has signs of liver cirrhosis and varices, likely due to obesity I recommend GI referral I told the patient this is the most likely cause of her thrombocytopenia She does not need long-term follow-up with me in this regard

## 2022-07-09 DIAGNOSIS — L2089 Other atopic dermatitis: Secondary | ICD-10-CM | POA: Diagnosis not present

## 2022-07-09 DIAGNOSIS — D1801 Hemangioma of skin and subcutaneous tissue: Secondary | ICD-10-CM | POA: Diagnosis not present

## 2022-07-09 DIAGNOSIS — L821 Other seborrheic keratosis: Secondary | ICD-10-CM | POA: Diagnosis not present

## 2022-07-09 DIAGNOSIS — Z85828 Personal history of other malignant neoplasm of skin: Secondary | ICD-10-CM | POA: Diagnosis not present

## 2022-07-09 DIAGNOSIS — L814 Other melanin hyperpigmentation: Secondary | ICD-10-CM | POA: Diagnosis not present

## 2022-07-11 DIAGNOSIS — E1169 Type 2 diabetes mellitus with other specified complication: Secondary | ICD-10-CM | POA: Diagnosis not present

## 2022-07-11 DIAGNOSIS — Z6841 Body Mass Index (BMI) 40.0 and over, adult: Secondary | ICD-10-CM | POA: Diagnosis not present

## 2022-07-11 DIAGNOSIS — K7581 Nonalcoholic steatohepatitis (NASH): Secondary | ICD-10-CM | POA: Diagnosis not present

## 2022-07-11 DIAGNOSIS — K7460 Unspecified cirrhosis of liver: Secondary | ICD-10-CM | POA: Diagnosis not present

## 2022-07-11 DIAGNOSIS — D696 Thrombocytopenia, unspecified: Secondary | ICD-10-CM | POA: Diagnosis not present

## 2022-07-11 DIAGNOSIS — E65 Localized adiposity: Secondary | ICD-10-CM | POA: Diagnosis not present

## 2022-07-16 DIAGNOSIS — E119 Type 2 diabetes mellitus without complications: Secondary | ICD-10-CM | POA: Diagnosis not present

## 2022-07-16 DIAGNOSIS — H2513 Age-related nuclear cataract, bilateral: Secondary | ICD-10-CM | POA: Diagnosis not present

## 2022-07-16 DIAGNOSIS — H52203 Unspecified astigmatism, bilateral: Secondary | ICD-10-CM | POA: Diagnosis not present

## 2022-08-08 DIAGNOSIS — E1169 Type 2 diabetes mellitus with other specified complication: Secondary | ICD-10-CM | POA: Diagnosis not present

## 2022-08-08 DIAGNOSIS — M1611 Unilateral primary osteoarthritis, right hip: Secondary | ICD-10-CM | POA: Diagnosis not present

## 2022-08-08 DIAGNOSIS — D509 Iron deficiency anemia, unspecified: Secondary | ICD-10-CM | POA: Diagnosis not present

## 2022-08-08 DIAGNOSIS — Z6841 Body Mass Index (BMI) 40.0 and over, adult: Secondary | ICD-10-CM | POA: Diagnosis not present

## 2022-08-08 DIAGNOSIS — E65 Localized adiposity: Secondary | ICD-10-CM | POA: Diagnosis not present

## 2022-08-08 DIAGNOSIS — K7460 Unspecified cirrhosis of liver: Secondary | ICD-10-CM | POA: Diagnosis not present

## 2022-08-10 DIAGNOSIS — R109 Unspecified abdominal pain: Secondary | ICD-10-CM | POA: Diagnosis not present

## 2022-08-10 DIAGNOSIS — D696 Thrombocytopenia, unspecified: Secondary | ICD-10-CM | POA: Diagnosis not present

## 2022-08-10 DIAGNOSIS — Z86010 Personal history of colonic polyps: Secondary | ICD-10-CM | POA: Diagnosis not present

## 2022-08-10 DIAGNOSIS — K7460 Unspecified cirrhosis of liver: Secondary | ICD-10-CM | POA: Diagnosis not present

## 2022-09-05 DIAGNOSIS — D509 Iron deficiency anemia, unspecified: Secondary | ICD-10-CM | POA: Diagnosis not present

## 2022-09-05 DIAGNOSIS — K7460 Unspecified cirrhosis of liver: Secondary | ICD-10-CM | POA: Diagnosis not present

## 2022-09-05 DIAGNOSIS — M1611 Unilateral primary osteoarthritis, right hip: Secondary | ICD-10-CM | POA: Diagnosis not present

## 2022-09-05 DIAGNOSIS — E65 Localized adiposity: Secondary | ICD-10-CM | POA: Diagnosis not present

## 2022-09-05 DIAGNOSIS — Z6841 Body Mass Index (BMI) 40.0 and over, adult: Secondary | ICD-10-CM | POA: Diagnosis not present

## 2022-09-05 DIAGNOSIS — E1169 Type 2 diabetes mellitus with other specified complication: Secondary | ICD-10-CM | POA: Diagnosis not present

## 2022-09-05 DIAGNOSIS — Z0000 Encounter for general adult medical examination without abnormal findings: Secondary | ICD-10-CM | POA: Diagnosis not present

## 2022-09-07 ENCOUNTER — Ambulatory Visit
Admission: RE | Admit: 2022-09-07 | Discharge: 2022-09-07 | Disposition: A | Discharge status: Home / Self Care | Payer: Medicare HMO | Source: Ambulatory Visit | Attending: Family Medicine | Admitting: Family Medicine

## 2022-09-07 DIAGNOSIS — M8589 Other specified disorders of bone density and structure, multiple sites: Secondary | ICD-10-CM | POA: Diagnosis not present

## 2022-09-07 DIAGNOSIS — R011 Cardiac murmur, unspecified: Secondary | ICD-10-CM | POA: Diagnosis not present

## 2022-09-07 DIAGNOSIS — D509 Iron deficiency anemia, unspecified: Secondary | ICD-10-CM | POA: Diagnosis not present

## 2022-09-07 DIAGNOSIS — E2839 Other primary ovarian failure: Secondary | ICD-10-CM

## 2022-09-07 DIAGNOSIS — K7581 Nonalcoholic steatohepatitis (NASH): Secondary | ICD-10-CM | POA: Diagnosis not present

## 2022-09-07 DIAGNOSIS — Z780 Asymptomatic menopausal state: Secondary | ICD-10-CM | POA: Diagnosis not present

## 2022-09-07 DIAGNOSIS — Z1231 Encounter for screening mammogram for malignant neoplasm of breast: Secondary | ICD-10-CM | POA: Diagnosis not present

## 2022-09-07 DIAGNOSIS — D696 Thrombocytopenia, unspecified: Secondary | ICD-10-CM | POA: Diagnosis not present

## 2022-09-07 DIAGNOSIS — K7460 Unspecified cirrhosis of liver: Secondary | ICD-10-CM | POA: Diagnosis not present

## 2022-09-13 DIAGNOSIS — R509 Fever, unspecified: Secondary | ICD-10-CM | POA: Diagnosis not present

## 2022-09-13 DIAGNOSIS — U071 COVID-19: Secondary | ICD-10-CM | POA: Diagnosis not present

## 2022-09-13 DIAGNOSIS — R051 Acute cough: Secondary | ICD-10-CM | POA: Diagnosis not present

## 2022-09-13 DIAGNOSIS — R5383 Other fatigue: Secondary | ICD-10-CM | POA: Diagnosis not present

## 2022-09-21 DIAGNOSIS — R011 Cardiac murmur, unspecified: Secondary | ICD-10-CM | POA: Diagnosis not present

## 2022-09-26 DIAGNOSIS — J4530 Mild persistent asthma, uncomplicated: Secondary | ICD-10-CM | POA: Diagnosis not present

## 2022-09-26 DIAGNOSIS — J3081 Allergic rhinitis due to animal (cat) (dog) hair and dander: Secondary | ICD-10-CM | POA: Diagnosis not present

## 2022-09-26 DIAGNOSIS — J301 Allergic rhinitis due to pollen: Secondary | ICD-10-CM | POA: Diagnosis not present

## 2022-09-26 DIAGNOSIS — J45998 Other asthma: Secondary | ICD-10-CM | POA: Diagnosis not present

## 2022-09-26 DIAGNOSIS — J3089 Other allergic rhinitis: Secondary | ICD-10-CM | POA: Diagnosis not present

## 2022-10-18 DIAGNOSIS — E65 Localized adiposity: Secondary | ICD-10-CM | POA: Diagnosis not present

## 2022-10-18 DIAGNOSIS — Z6841 Body Mass Index (BMI) 40.0 and over, adult: Secondary | ICD-10-CM | POA: Diagnosis not present

## 2022-10-18 DIAGNOSIS — Z0000 Encounter for general adult medical examination without abnormal findings: Secondary | ICD-10-CM | POA: Diagnosis not present

## 2022-10-18 DIAGNOSIS — E1169 Type 2 diabetes mellitus with other specified complication: Secondary | ICD-10-CM | POA: Diagnosis not present

## 2022-11-06 DIAGNOSIS — E1169 Type 2 diabetes mellitus with other specified complication: Secondary | ICD-10-CM | POA: Diagnosis not present

## 2022-11-06 DIAGNOSIS — Z6841 Body Mass Index (BMI) 40.0 and over, adult: Secondary | ICD-10-CM | POA: Diagnosis not present

## 2022-11-06 DIAGNOSIS — Z0000 Encounter for general adult medical examination without abnormal findings: Secondary | ICD-10-CM | POA: Diagnosis not present

## 2022-11-21 ENCOUNTER — Other Ambulatory Visit: Payer: Self-pay | Admitting: Gastroenterology

## 2022-11-21 DIAGNOSIS — K7460 Unspecified cirrhosis of liver: Secondary | ICD-10-CM | POA: Diagnosis not present

## 2022-11-21 DIAGNOSIS — Z86010 Personal history of colonic polyps: Secondary | ICD-10-CM | POA: Diagnosis not present

## 2022-11-21 DIAGNOSIS — D696 Thrombocytopenia, unspecified: Secondary | ICD-10-CM | POA: Diagnosis not present

## 2022-11-26 DIAGNOSIS — E1169 Type 2 diabetes mellitus with other specified complication: Secondary | ICD-10-CM | POA: Diagnosis not present

## 2022-12-14 DIAGNOSIS — D509 Iron deficiency anemia, unspecified: Secondary | ICD-10-CM | POA: Diagnosis not present

## 2022-12-14 DIAGNOSIS — R600 Localized edema: Secondary | ICD-10-CM | POA: Diagnosis not present

## 2022-12-14 DIAGNOSIS — E7800 Pure hypercholesterolemia, unspecified: Secondary | ICD-10-CM | POA: Diagnosis not present

## 2022-12-14 DIAGNOSIS — E039 Hypothyroidism, unspecified: Secondary | ICD-10-CM | POA: Diagnosis not present

## 2022-12-14 DIAGNOSIS — I700 Atherosclerosis of aorta: Secondary | ICD-10-CM | POA: Diagnosis not present

## 2022-12-14 DIAGNOSIS — M1990 Unspecified osteoarthritis, unspecified site: Secondary | ICD-10-CM | POA: Diagnosis not present

## 2022-12-14 DIAGNOSIS — K219 Gastro-esophageal reflux disease without esophagitis: Secondary | ICD-10-CM | POA: Diagnosis not present

## 2022-12-14 DIAGNOSIS — E041 Nontoxic single thyroid nodule: Secondary | ICD-10-CM | POA: Diagnosis not present

## 2022-12-14 DIAGNOSIS — Z0000 Encounter for general adult medical examination without abnormal findings: Secondary | ICD-10-CM | POA: Diagnosis not present

## 2022-12-14 DIAGNOSIS — K7460 Unspecified cirrhosis of liver: Secondary | ICD-10-CM | POA: Diagnosis not present

## 2022-12-14 DIAGNOSIS — J45909 Unspecified asthma, uncomplicated: Secondary | ICD-10-CM | POA: Diagnosis not present

## 2022-12-14 DIAGNOSIS — R32 Unspecified urinary incontinence: Secondary | ICD-10-CM | POA: Diagnosis not present

## 2022-12-17 DIAGNOSIS — D696 Thrombocytopenia, unspecified: Secondary | ICD-10-CM | POA: Diagnosis not present

## 2022-12-17 DIAGNOSIS — E1169 Type 2 diabetes mellitus with other specified complication: Secondary | ICD-10-CM | POA: Diagnosis not present

## 2022-12-17 DIAGNOSIS — Z6841 Body Mass Index (BMI) 40.0 and over, adult: Secondary | ICD-10-CM | POA: Diagnosis not present

## 2022-12-17 DIAGNOSIS — E039 Hypothyroidism, unspecified: Secondary | ICD-10-CM | POA: Diagnosis not present

## 2022-12-17 DIAGNOSIS — K7460 Unspecified cirrhosis of liver: Secondary | ICD-10-CM | POA: Diagnosis not present

## 2022-12-21 ENCOUNTER — Ambulatory Visit
Admission: RE | Admit: 2022-12-21 | Discharge: 2022-12-21 | Disposition: A | Discharge status: Home / Self Care | Payer: Medicare HMO | Source: Ambulatory Visit | Attending: Gastroenterology | Admitting: Gastroenterology

## 2022-12-21 DIAGNOSIS — K7460 Unspecified cirrhosis of liver: Secondary | ICD-10-CM | POA: Diagnosis not present

## 2022-12-21 DIAGNOSIS — I10 Essential (primary) hypertension: Secondary | ICD-10-CM | POA: Diagnosis not present

## 2022-12-21 DIAGNOSIS — K766 Portal hypertension: Secondary | ICD-10-CM | POA: Diagnosis not present

## 2022-12-28 DIAGNOSIS — Z6841 Body Mass Index (BMI) 40.0 and over, adult: Secondary | ICD-10-CM | POA: Diagnosis not present

## 2022-12-28 DIAGNOSIS — M1611 Unilateral primary osteoarthritis, right hip: Secondary | ICD-10-CM | POA: Diagnosis not present

## 2022-12-28 DIAGNOSIS — M545 Low back pain, unspecified: Secondary | ICD-10-CM | POA: Diagnosis not present

## 2023-01-02 DIAGNOSIS — S0502XA Injury of conjunctiva and corneal abrasion without foreign body, left eye, initial encounter: Secondary | ICD-10-CM | POA: Diagnosis not present

## 2023-01-07 DIAGNOSIS — Z6841 Body Mass Index (BMI) 40.0 and over, adult: Secondary | ICD-10-CM | POA: Diagnosis not present

## 2023-01-07 DIAGNOSIS — D696 Thrombocytopenia, unspecified: Secondary | ICD-10-CM | POA: Diagnosis not present

## 2023-01-07 DIAGNOSIS — E1169 Type 2 diabetes mellitus with other specified complication: Secondary | ICD-10-CM | POA: Diagnosis not present

## 2023-01-07 DIAGNOSIS — E039 Hypothyroidism, unspecified: Secondary | ICD-10-CM | POA: Diagnosis not present

## 2023-01-07 DIAGNOSIS — K7460 Unspecified cirrhosis of liver: Secondary | ICD-10-CM | POA: Diagnosis not present

## 2023-01-17 DIAGNOSIS — N3946 Mixed incontinence: Secondary | ICD-10-CM | POA: Diagnosis not present

## 2023-01-17 DIAGNOSIS — R350 Frequency of micturition: Secondary | ICD-10-CM | POA: Diagnosis not present

## 2023-01-23 DIAGNOSIS — E039 Hypothyroidism, unspecified: Secondary | ICD-10-CM | POA: Diagnosis not present

## 2023-01-23 DIAGNOSIS — M1990 Unspecified osteoarthritis, unspecified site: Secondary | ICD-10-CM | POA: Diagnosis not present

## 2023-01-23 DIAGNOSIS — E785 Hyperlipidemia, unspecified: Secondary | ICD-10-CM | POA: Diagnosis not present

## 2023-01-23 DIAGNOSIS — N393 Stress incontinence (female) (male): Secondary | ICD-10-CM | POA: Diagnosis not present

## 2023-01-23 DIAGNOSIS — R600 Localized edema: Secondary | ICD-10-CM | POA: Diagnosis not present

## 2023-01-23 DIAGNOSIS — R269 Unspecified abnormalities of gait and mobility: Secondary | ICD-10-CM | POA: Diagnosis not present

## 2023-01-23 DIAGNOSIS — D649 Anemia, unspecified: Secondary | ICD-10-CM | POA: Diagnosis not present

## 2023-01-23 DIAGNOSIS — R252 Cramp and spasm: Secondary | ICD-10-CM | POA: Diagnosis not present

## 2023-01-23 DIAGNOSIS — K219 Gastro-esophageal reflux disease without esophagitis: Secondary | ICD-10-CM | POA: Diagnosis not present

## 2023-01-23 DIAGNOSIS — R32 Unspecified urinary incontinence: Secondary | ICD-10-CM | POA: Diagnosis not present

## 2023-01-23 DIAGNOSIS — J45909 Unspecified asthma, uncomplicated: Secondary | ICD-10-CM | POA: Diagnosis not present

## 2023-01-23 DIAGNOSIS — Z008 Encounter for other general examination: Secondary | ICD-10-CM | POA: Diagnosis not present

## 2023-02-05 DIAGNOSIS — Z6841 Body Mass Index (BMI) 40.0 and over, adult: Secondary | ICD-10-CM | POA: Diagnosis not present

## 2023-02-05 DIAGNOSIS — D696 Thrombocytopenia, unspecified: Secondary | ICD-10-CM | POA: Diagnosis not present

## 2023-02-05 DIAGNOSIS — E039 Hypothyroidism, unspecified: Secondary | ICD-10-CM | POA: Diagnosis not present

## 2023-02-05 DIAGNOSIS — E1169 Type 2 diabetes mellitus with other specified complication: Secondary | ICD-10-CM | POA: Diagnosis not present

## 2023-02-05 DIAGNOSIS — K7460 Unspecified cirrhosis of liver: Secondary | ICD-10-CM | POA: Diagnosis not present

## 2023-02-15 DIAGNOSIS — R350 Frequency of micturition: Secondary | ICD-10-CM | POA: Diagnosis not present

## 2023-02-15 DIAGNOSIS — R8271 Bacteriuria: Secondary | ICD-10-CM | POA: Diagnosis not present

## 2023-02-15 DIAGNOSIS — N3000 Acute cystitis without hematuria: Secondary | ICD-10-CM | POA: Diagnosis not present

## 2023-02-22 DIAGNOSIS — R350 Frequency of micturition: Secondary | ICD-10-CM | POA: Diagnosis not present

## 2023-02-22 DIAGNOSIS — N3000 Acute cystitis without hematuria: Secondary | ICD-10-CM | POA: Diagnosis not present

## 2023-03-05 DIAGNOSIS — Z6841 Body Mass Index (BMI) 40.0 and over, adult: Secondary | ICD-10-CM | POA: Diagnosis not present

## 2023-03-05 DIAGNOSIS — E039 Hypothyroidism, unspecified: Secondary | ICD-10-CM | POA: Diagnosis not present

## 2023-03-05 DIAGNOSIS — D696 Thrombocytopenia, unspecified: Secondary | ICD-10-CM | POA: Diagnosis not present

## 2023-03-05 DIAGNOSIS — E1169 Type 2 diabetes mellitus with other specified complication: Secondary | ICD-10-CM | POA: Diagnosis not present

## 2023-03-05 DIAGNOSIS — K7460 Unspecified cirrhosis of liver: Secondary | ICD-10-CM | POA: Diagnosis not present

## 2023-03-18 DIAGNOSIS — K7460 Unspecified cirrhosis of liver: Secondary | ICD-10-CM | POA: Diagnosis not present

## 2023-03-18 DIAGNOSIS — J45909 Unspecified asthma, uncomplicated: Secondary | ICD-10-CM | POA: Diagnosis not present

## 2023-03-18 DIAGNOSIS — I700 Atherosclerosis of aorta: Secondary | ICD-10-CM | POA: Diagnosis not present

## 2023-03-18 DIAGNOSIS — D696 Thrombocytopenia, unspecified: Secondary | ICD-10-CM | POA: Diagnosis not present

## 2023-03-18 DIAGNOSIS — K580 Irritable bowel syndrome with diarrhea: Secondary | ICD-10-CM | POA: Diagnosis not present

## 2023-03-18 DIAGNOSIS — K219 Gastro-esophageal reflux disease without esophagitis: Secondary | ICD-10-CM | POA: Diagnosis not present

## 2023-03-18 DIAGNOSIS — K766 Portal hypertension: Secondary | ICD-10-CM | POA: Diagnosis not present

## 2023-03-18 DIAGNOSIS — C9101 Acute lymphoblastic leukemia, in remission: Secondary | ICD-10-CM | POA: Diagnosis not present

## 2023-03-18 DIAGNOSIS — E039 Hypothyroidism, unspecified: Secondary | ICD-10-CM | POA: Diagnosis not present

## 2023-04-02 DIAGNOSIS — E039 Hypothyroidism, unspecified: Secondary | ICD-10-CM | POA: Diagnosis not present

## 2023-04-02 DIAGNOSIS — Z6841 Body Mass Index (BMI) 40.0 and over, adult: Secondary | ICD-10-CM | POA: Diagnosis not present

## 2023-04-02 DIAGNOSIS — K7460 Unspecified cirrhosis of liver: Secondary | ICD-10-CM | POA: Diagnosis not present

## 2023-04-02 DIAGNOSIS — D696 Thrombocytopenia, unspecified: Secondary | ICD-10-CM | POA: Diagnosis not present

## 2023-04-02 DIAGNOSIS — E1169 Type 2 diabetes mellitus with other specified complication: Secondary | ICD-10-CM | POA: Diagnosis not present

## 2023-04-03 DIAGNOSIS — N3946 Mixed incontinence: Secondary | ICD-10-CM | POA: Diagnosis not present

## 2023-04-03 DIAGNOSIS — R350 Frequency of micturition: Secondary | ICD-10-CM | POA: Diagnosis not present

## 2023-04-22 DIAGNOSIS — D696 Thrombocytopenia, unspecified: Secondary | ICD-10-CM | POA: Diagnosis not present

## 2023-04-22 DIAGNOSIS — E039 Hypothyroidism, unspecified: Secondary | ICD-10-CM | POA: Diagnosis not present

## 2023-04-22 DIAGNOSIS — E1169 Type 2 diabetes mellitus with other specified complication: Secondary | ICD-10-CM | POA: Diagnosis not present

## 2023-04-22 DIAGNOSIS — K7460 Unspecified cirrhosis of liver: Secondary | ICD-10-CM | POA: Diagnosis not present

## 2023-04-22 DIAGNOSIS — Z6841 Body Mass Index (BMI) 40.0 and over, adult: Secondary | ICD-10-CM | POA: Diagnosis not present

## 2023-05-15 DIAGNOSIS — N3946 Mixed incontinence: Secondary | ICD-10-CM | POA: Diagnosis not present

## 2023-05-15 DIAGNOSIS — R350 Frequency of micturition: Secondary | ICD-10-CM | POA: Diagnosis not present

## 2023-05-21 DIAGNOSIS — Z6841 Body Mass Index (BMI) 40.0 and over, adult: Secondary | ICD-10-CM | POA: Diagnosis not present

## 2023-05-21 DIAGNOSIS — K7460 Unspecified cirrhosis of liver: Secondary | ICD-10-CM | POA: Diagnosis not present

## 2023-05-21 DIAGNOSIS — E039 Hypothyroidism, unspecified: Secondary | ICD-10-CM | POA: Diagnosis not present

## 2023-05-21 DIAGNOSIS — D696 Thrombocytopenia, unspecified: Secondary | ICD-10-CM | POA: Diagnosis not present

## 2023-05-21 DIAGNOSIS — E1169 Type 2 diabetes mellitus with other specified complication: Secondary | ICD-10-CM | POA: Diagnosis not present

## 2023-05-24 ENCOUNTER — Other Ambulatory Visit: Payer: Self-pay | Admitting: Gastroenterology

## 2023-05-24 DIAGNOSIS — K7581 Nonalcoholic steatohepatitis (NASH): Secondary | ICD-10-CM | POA: Diagnosis not present

## 2023-05-24 DIAGNOSIS — K7460 Unspecified cirrhosis of liver: Secondary | ICD-10-CM

## 2023-05-24 DIAGNOSIS — Z86010 Personal history of colonic polyps: Secondary | ICD-10-CM | POA: Diagnosis not present

## 2023-05-24 DIAGNOSIS — K317 Polyp of stomach and duodenum: Secondary | ICD-10-CM | POA: Diagnosis not present

## 2023-05-29 ENCOUNTER — Ambulatory Visit
Admission: RE | Admit: 2023-05-29 | Discharge: 2023-05-29 | Disposition: A | Discharge status: Home / Self Care | Payer: Medicare HMO | Source: Ambulatory Visit | Attending: Gastroenterology | Admitting: Gastroenterology

## 2023-05-29 DIAGNOSIS — K7460 Unspecified cirrhosis of liver: Secondary | ICD-10-CM

## 2023-05-29 DIAGNOSIS — K828 Other specified diseases of gallbladder: Secondary | ICD-10-CM | POA: Diagnosis not present

## 2023-06-18 DIAGNOSIS — C9101 Acute lymphoblastic leukemia, in remission: Secondary | ICD-10-CM | POA: Diagnosis not present

## 2023-06-18 DIAGNOSIS — K7460 Unspecified cirrhosis of liver: Secondary | ICD-10-CM | POA: Diagnosis not present

## 2023-06-27 ENCOUNTER — Other Ambulatory Visit: Payer: Self-pay | Admitting: Family Medicine

## 2023-06-27 ENCOUNTER — Other Ambulatory Visit (HOSPITAL_COMMUNITY): Payer: Self-pay | Admitting: Respiratory Therapy

## 2023-06-27 DIAGNOSIS — J45909 Unspecified asthma, uncomplicated: Secondary | ICD-10-CM

## 2023-06-27 DIAGNOSIS — Z1231 Encounter for screening mammogram for malignant neoplasm of breast: Secondary | ICD-10-CM

## 2023-07-03 ENCOUNTER — Ambulatory Visit (HOSPITAL_COMMUNITY)
Admission: RE | Admit: 2023-07-03 | Discharge: 2023-07-03 | Disposition: A | Discharge status: Home / Self Care | Payer: Medicare HMO | Source: Ambulatory Visit | Attending: Family Medicine | Admitting: Family Medicine

## 2023-07-03 DIAGNOSIS — J45909 Unspecified asthma, uncomplicated: Secondary | ICD-10-CM | POA: Insufficient documentation

## 2023-07-03 LAB — PULMONARY FUNCTION TEST
DL/VA % pred: 171 %
DL/VA: 7.32 ml/min/mmHg/L
DLCO unc % pred: 108 %
DLCO unc: 21.39 ml/min/mmHg
FEF 25-75 Post: 2.87 L/s
FEF 25-75 Pre: 2.59 L/s
FEF2575-%Change-Post: 10 %
FEF2575-%Pred-Post: 118 %
FEF2575-%Pred-Pre: 107 %
FEV1-%Change-Post: 1 %
FEV1-%Pred-Post: 74 %
FEV1-%Pred-Pre: 73 %
FEV1-Post: 1.89 L
FEV1-Pre: 1.86 L
FEV1FVC-%Change-Post: 2 %
FEV1FVC-%Pred-Pre: 114 %
FEV6-%Change-Post: -1 %
FEV6-%Pred-Post: 64 %
FEV6-%Pred-Pre: 65 %
FEV6-Post: 2.04 L
FEV6-Pre: 2.07 L
FEV6FVC-%Pred-Post: 103 %
FEV6FVC-%Pred-Pre: 103 %
FVC-%Change-Post: 0 %
FVC-%Pred-Post: 63 %
FVC-%Pred-Pre: 63 %
FVC-Post: 2.06 L
FVC-Pre: 2.07 L
Post FEV1/FVC ratio: 92 %
Post FEV6/FVC ratio: 100 %
Pre FEV1/FVC ratio: 90 %
Pre FEV6/FVC Ratio: 100 %
RV % pred: 35 %
RV: 0.66 L
TLC % pred: 62 %
TLC: 3.06 L

## 2023-07-03 MED ORDER — ALBUTEROL SULFATE (2.5 MG/3ML) 0.083% IN NEBU
2.5000 mg | INHALATION_SOLUTION | Freq: Once | RESPIRATORY_TRACT | Status: AC
  Administered 2023-07-03: 2.5 mg via RESPIRATORY_TRACT

## 2023-07-10 DIAGNOSIS — Z6841 Body Mass Index (BMI) 40.0 and over, adult: Secondary | ICD-10-CM | POA: Diagnosis not present

## 2023-07-10 DIAGNOSIS — M7061 Trochanteric bursitis, right hip: Secondary | ICD-10-CM | POA: Diagnosis not present

## 2023-07-11 DIAGNOSIS — L814 Other melanin hyperpigmentation: Secondary | ICD-10-CM | POA: Diagnosis not present

## 2023-07-11 DIAGNOSIS — L821 Other seborrheic keratosis: Secondary | ICD-10-CM | POA: Diagnosis not present

## 2023-07-11 DIAGNOSIS — Z85828 Personal history of other malignant neoplasm of skin: Secondary | ICD-10-CM | POA: Diagnosis not present

## 2023-07-11 DIAGNOSIS — D1801 Hemangioma of skin and subcutaneous tissue: Secondary | ICD-10-CM | POA: Diagnosis not present

## 2023-07-11 DIAGNOSIS — L570 Actinic keratosis: Secondary | ICD-10-CM | POA: Diagnosis not present

## 2023-07-24 DIAGNOSIS — H52203 Unspecified astigmatism, bilateral: Secondary | ICD-10-CM | POA: Diagnosis not present

## 2023-07-24 DIAGNOSIS — E119 Type 2 diabetes mellitus without complications: Secondary | ICD-10-CM | POA: Diagnosis not present

## 2023-07-24 DIAGNOSIS — H2513 Age-related nuclear cataract, bilateral: Secondary | ICD-10-CM | POA: Diagnosis not present

## 2023-08-08 DIAGNOSIS — Z6841 Body Mass Index (BMI) 40.0 and over, adult: Secondary | ICD-10-CM | POA: Diagnosis not present

## 2023-08-08 DIAGNOSIS — E039 Hypothyroidism, unspecified: Secondary | ICD-10-CM | POA: Diagnosis not present

## 2023-08-08 DIAGNOSIS — E119 Type 2 diabetes mellitus without complications: Secondary | ICD-10-CM | POA: Diagnosis not present

## 2023-08-08 DIAGNOSIS — Z856 Personal history of leukemia: Secondary | ICD-10-CM | POA: Diagnosis not present

## 2023-08-08 DIAGNOSIS — J019 Acute sinusitis, unspecified: Secondary | ICD-10-CM | POA: Diagnosis not present

## 2023-08-08 DIAGNOSIS — K219 Gastro-esophageal reflux disease without esophagitis: Secondary | ICD-10-CM | POA: Diagnosis not present

## 2023-08-08 DIAGNOSIS — J45909 Unspecified asthma, uncomplicated: Secondary | ICD-10-CM | POA: Diagnosis not present

## 2023-08-08 DIAGNOSIS — K7460 Unspecified cirrhosis of liver: Secondary | ICD-10-CM | POA: Diagnosis not present

## 2023-08-08 DIAGNOSIS — E7800 Pure hypercholesterolemia, unspecified: Secondary | ICD-10-CM | POA: Diagnosis not present

## 2023-08-08 DIAGNOSIS — R600 Localized edema: Secondary | ICD-10-CM | POA: Diagnosis not present

## 2023-08-27 DIAGNOSIS — R310 Gross hematuria: Secondary | ICD-10-CM | POA: Diagnosis not present

## 2023-08-27 DIAGNOSIS — Z6841 Body Mass Index (BMI) 40.0 and over, adult: Secondary | ICD-10-CM | POA: Diagnosis not present

## 2023-08-27 DIAGNOSIS — N644 Mastodynia: Secondary | ICD-10-CM | POA: Diagnosis not present

## 2023-08-27 DIAGNOSIS — R8271 Bacteriuria: Secondary | ICD-10-CM | POA: Diagnosis not present

## 2023-08-27 DIAGNOSIS — N3000 Acute cystitis without hematuria: Secondary | ICD-10-CM | POA: Diagnosis not present

## 2023-08-27 DIAGNOSIS — M25559 Pain in unspecified hip: Secondary | ICD-10-CM | POA: Diagnosis not present

## 2023-09-10 ENCOUNTER — Ambulatory Visit
Admission: RE | Admit: 2023-09-10 | Discharge: 2023-09-10 | Disposition: A | Discharge status: Home / Self Care | Payer: Medicare HMO | Source: Ambulatory Visit | Attending: Family Medicine | Admitting: Family Medicine

## 2023-09-10 DIAGNOSIS — Z1231 Encounter for screening mammogram for malignant neoplasm of breast: Secondary | ICD-10-CM | POA: Diagnosis not present

## 2023-09-16 DIAGNOSIS — R300 Dysuria: Secondary | ICD-10-CM | POA: Diagnosis not present

## 2023-09-26 DIAGNOSIS — K7460 Unspecified cirrhosis of liver: Secondary | ICD-10-CM | POA: Diagnosis not present

## 2023-10-01 DIAGNOSIS — E041 Nontoxic single thyroid nodule: Secondary | ICD-10-CM | POA: Diagnosis not present

## 2023-10-01 DIAGNOSIS — J45909 Unspecified asthma, uncomplicated: Secondary | ICD-10-CM | POA: Diagnosis not present

## 2023-10-01 DIAGNOSIS — L853 Xerosis cutis: Secondary | ICD-10-CM | POA: Diagnosis not present

## 2023-10-01 DIAGNOSIS — K7581 Nonalcoholic steatohepatitis (NASH): Secondary | ICD-10-CM | POA: Diagnosis not present

## 2023-10-01 DIAGNOSIS — K219 Gastro-esophageal reflux disease without esophagitis: Secondary | ICD-10-CM | POA: Diagnosis not present

## 2023-10-01 DIAGNOSIS — R3129 Other microscopic hematuria: Secondary | ICD-10-CM | POA: Diagnosis not present

## 2023-10-01 DIAGNOSIS — K760 Fatty (change of) liver, not elsewhere classified: Secondary | ICD-10-CM | POA: Diagnosis not present

## 2023-10-01 DIAGNOSIS — R222 Localized swelling, mass and lump, trunk: Secondary | ICD-10-CM | POA: Diagnosis not present

## 2023-10-01 DIAGNOSIS — E7800 Pure hypercholesterolemia, unspecified: Secondary | ICD-10-CM | POA: Diagnosis not present

## 2023-10-01 DIAGNOSIS — E039 Hypothyroidism, unspecified: Secondary | ICD-10-CM | POA: Diagnosis not present

## 2023-10-01 DIAGNOSIS — N3946 Mixed incontinence: Secondary | ICD-10-CM | POA: Diagnosis not present

## 2023-10-01 DIAGNOSIS — E1169 Type 2 diabetes mellitus with other specified complication: Secondary | ICD-10-CM | POA: Diagnosis not present

## 2023-10-02 ENCOUNTER — Other Ambulatory Visit: Payer: Self-pay | Admitting: Family Medicine

## 2023-10-02 DIAGNOSIS — J4530 Mild persistent asthma, uncomplicated: Secondary | ICD-10-CM | POA: Diagnosis not present

## 2023-10-02 DIAGNOSIS — J301 Allergic rhinitis due to pollen: Secondary | ICD-10-CM | POA: Diagnosis not present

## 2023-10-02 DIAGNOSIS — J3089 Other allergic rhinitis: Secondary | ICD-10-CM | POA: Diagnosis not present

## 2023-10-02 DIAGNOSIS — J3081 Allergic rhinitis due to animal (cat) (dog) hair and dander: Secondary | ICD-10-CM | POA: Diagnosis not present

## 2023-10-02 DIAGNOSIS — R222 Localized swelling, mass and lump, trunk: Secondary | ICD-10-CM

## 2023-10-09 DIAGNOSIS — R350 Frequency of micturition: Secondary | ICD-10-CM | POA: Diagnosis not present

## 2023-10-09 DIAGNOSIS — R310 Gross hematuria: Secondary | ICD-10-CM | POA: Diagnosis not present

## 2023-10-24 ENCOUNTER — Ambulatory Visit
Admission: RE | Admit: 2023-10-24 | Discharge: 2023-10-24 | Disposition: A | Discharge status: Home / Self Care | Payer: Medicare HMO | Source: Ambulatory Visit | Attending: Family Medicine | Admitting: Family Medicine

## 2023-10-24 DIAGNOSIS — R222 Localized swelling, mass and lump, trunk: Secondary | ICD-10-CM

## 2023-10-24 DIAGNOSIS — N6331 Unspecified lump in axillary tail of the right breast: Secondary | ICD-10-CM | POA: Diagnosis not present

## 2023-11-07 DIAGNOSIS — M6289 Other specified disorders of muscle: Secondary | ICD-10-CM | POA: Diagnosis not present

## 2023-11-07 DIAGNOSIS — E65 Localized adiposity: Secondary | ICD-10-CM | POA: Diagnosis not present

## 2023-11-07 DIAGNOSIS — M25551 Pain in right hip: Secondary | ICD-10-CM | POA: Diagnosis not present

## 2023-11-12 DIAGNOSIS — K317 Polyp of stomach and duodenum: Secondary | ICD-10-CM | POA: Diagnosis not present

## 2023-11-12 DIAGNOSIS — Z860100 Personal history of colon polyps, unspecified: Secondary | ICD-10-CM | POA: Diagnosis not present

## 2023-11-12 DIAGNOSIS — K7460 Unspecified cirrhosis of liver: Secondary | ICD-10-CM | POA: Diagnosis not present

## 2023-11-25 ENCOUNTER — Other Ambulatory Visit: Payer: Self-pay | Admitting: Gastroenterology

## 2023-11-25 DIAGNOSIS — K7469 Other cirrhosis of liver: Secondary | ICD-10-CM

## 2023-11-29 ENCOUNTER — Ambulatory Visit
Admission: RE | Admit: 2023-11-29 | Discharge: 2023-11-29 | Disposition: A | Discharge status: Home / Self Care | Source: Ambulatory Visit | Attending: Gastroenterology | Admitting: Gastroenterology

## 2023-11-29 DIAGNOSIS — K7469 Other cirrhosis of liver: Secondary | ICD-10-CM

## 2023-11-29 DIAGNOSIS — K828 Other specified diseases of gallbladder: Secondary | ICD-10-CM | POA: Diagnosis not present

## 2023-12-05 DIAGNOSIS — R351 Nocturia: Secondary | ICD-10-CM | POA: Diagnosis not present

## 2023-12-05 DIAGNOSIS — N3941 Urge incontinence: Secondary | ICD-10-CM | POA: Diagnosis not present

## 2023-12-05 DIAGNOSIS — N3281 Overactive bladder: Secondary | ICD-10-CM | POA: Diagnosis not present

## 2023-12-05 DIAGNOSIS — M6281 Muscle weakness (generalized): Secondary | ICD-10-CM | POA: Diagnosis not present

## 2023-12-05 DIAGNOSIS — R3915 Urgency of urination: Secondary | ICD-10-CM | POA: Diagnosis not present

## 2023-12-05 DIAGNOSIS — M62838 Other muscle spasm: Secondary | ICD-10-CM | POA: Diagnosis not present

## 2023-12-05 DIAGNOSIS — R102 Pelvic and perineal pain: Secondary | ICD-10-CM | POA: Diagnosis not present

## 2023-12-12 ENCOUNTER — Other Ambulatory Visit (HOSPITAL_COMMUNITY): Payer: Self-pay | Admitting: General Surgery

## 2023-12-12 DIAGNOSIS — K8020 Calculus of gallbladder without cholecystitis without obstruction: Secondary | ICD-10-CM

## 2023-12-30 ENCOUNTER — Ambulatory Visit (HOSPITAL_COMMUNITY)
Admission: RE | Admit: 2023-12-30 | Discharge: 2023-12-30 | Disposition: A | Discharge status: Home / Self Care | Source: Ambulatory Visit | Attending: General Surgery | Admitting: General Surgery

## 2023-12-30 DIAGNOSIS — K8020 Calculus of gallbladder without cholecystitis without obstruction: Secondary | ICD-10-CM | POA: Diagnosis not present

## 2023-12-30 DIAGNOSIS — R932 Abnormal findings on diagnostic imaging of liver and biliary tract: Secondary | ICD-10-CM | POA: Diagnosis not present

## 2023-12-30 MED ORDER — TECHNETIUM TC 99M MEBROFENIN IV KIT
5.4500 | PACK | Freq: Once | INTRAVENOUS | Status: AC | PRN
  Administered 2023-12-30: 5.45 via INTRAVENOUS

## 2023-12-30 MED ORDER — MORPHINE SULFATE (PF) 2 MG/ML IV SOLN
INTRAVENOUS | Status: AC
  Filled 2023-12-30: qty 2

## 2023-12-30 MED ORDER — MORPHINE SULFATE (PF) 2 MG/ML IV SOLN
3.0000 mg | Freq: Once | INTRAVENOUS | Status: AC
  Administered 2023-12-30: 3 mg via INTRAVENOUS

## 2024-01-27 DIAGNOSIS — M7072 Other bursitis of hip, left hip: Secondary | ICD-10-CM | POA: Diagnosis not present

## 2024-01-27 DIAGNOSIS — K819 Cholecystitis, unspecified: Secondary | ICD-10-CM | POA: Diagnosis not present

## 2024-01-27 DIAGNOSIS — E65 Localized adiposity: Secondary | ICD-10-CM | POA: Diagnosis not present

## 2024-01-27 DIAGNOSIS — M25551 Pain in right hip: Secondary | ICD-10-CM | POA: Diagnosis not present

## 2024-02-13 ENCOUNTER — Ambulatory Visit: Admitting: Plastic Surgery

## 2024-02-13 ENCOUNTER — Encounter: Payer: Self-pay | Admitting: Plastic Surgery

## 2024-02-13 VITALS — BP 122/78 | HR 80 | Ht 63.0 in | Wt 270.2 lb

## 2024-02-13 DIAGNOSIS — E65 Localized adiposity: Secondary | ICD-10-CM

## 2024-02-13 DIAGNOSIS — Z6841 Body Mass Index (BMI) 40.0 and over, adult: Secondary | ICD-10-CM | POA: Diagnosis not present

## 2024-02-13 NOTE — Progress Notes (Signed)
 Referring Provider Rance Burrows, MD [phone] [address] [place],  [place] [address]   CC:  Chief Complaint  Patient presents with   Consult           Marissa [name] is an 58 y.o. female.  HPI: Marissa [name] is a 58 year old female who presents today for evaluation for panniculectomy.  She is sent by her primary care physician Dr. Leighton Punches.  Patient notes that she has ongoing rashes and pain from the excess skin and fat on her anterior abdominal wall.  Medically the patient has cirrhosis with portal hypertension recannulization through the periumbilical veins and thrombocytopenia.  Allergies  Allergen Reactions   Breo Ellipta [Fluticasone Furoate-Vilanterol] Anaphylaxis   Erythromycin Shortness Of Breath   Penicillins Shortness Of Breath    Has patient had a PCN reaction causing immediate rash, facial/tongue/throat swelling, SOB or lightheadedness with hypotension: Yes Has patient had a PCN reaction causing severe rash involving mucus membranes or skin necrosis: Yes Has patient had a PCN reaction that required hospitalization Unknown Has patient had a PCN reaction occurring within the last 10 years: No If all of the above answers are "NO", then may proceed with Cephalosporin use.    Quinine Derivatives Shortness Of Breath   Sulfa Antibiotics Shortness Of Breath    Outpatient Encounter Medications as of 02/13/2024  Medication Sig   Cranberry-Vitamin C-Vitamin E (CRANBERRY PLUS VITAMIN C PO) Take 1 tablet by mouth daily.    cyclobenzaprine  (FLEXERIL ) 10 MG tablet Take 1 tablet (10 mg total) by mouth 3 (three) times daily as needed for muscle spasms.   fexofenadine (ALLEGRA) 180 MG tablet Take 180 mg by mouth daily.    fluticasone (FLONASE) 50 MCG/ACT nasal spray Place 2 sprays into both nostrils daily.   furosemide  (LASIX ) 20 MG tablet TAKE 1 TABLET BY MOUTH EVERY MORNING AND 1 TAB AT NOON AS NEEDED FOR EDEMA   levothyroxine  (SYNTHROID ) 75 MCG tablet TAKE 1 TABLET EVERY  DAY BEFORE BREAKFAST   Magnesium 250 MG TABS Take 250 mg by mouth daily.    Menaquinone-7 (VITAMIN K2) 100 MCG CAPS 1 capsule   montelukast (SINGULAIR) 10 MG tablet Take 10 mg by mouth at bedtime.    Multiple Vitamins-Minerals (VITAMIN D3 COMPLETE) TABS 1 capsule   pantoprazole  (PROTONIX ) 40 MG tablet TAKE 1 TABLET BY MOUTH EVERY DAY   potassium chloride  (KLOR-CON  M10) 10 MEQ tablet TAKE 1 TABLET BY MOUTH TWICE A DAY WHEN TAKING LASIX    PROAIR  HFA 108 (90 BASE) MCG/ACT inhaler Inhale 1-2 puffs into the lungs every 6 (six) hours as needed for wheezing or shortness of breath.    traMADol  (ULTRAM ) 50 MG tablet Take 1 tablet (50 mg total) by mouth at bedtime as needed. (Patient taking differently: Take 50 mg by mouth at bedtime as needed. Takes 100 mg at night)   ferrous sulfate 325 (65 FE) MG tablet Take 325 mg by mouth every other day.   fluticasone (FLOVENT HFA) 110 MCG/ACT inhaler Inhale 1 puff into the lungs 2 (two) times daily.    OZEMPIC, 0.25 OR 0.5 MG/DOSE, 2 MG/3ML SOPN Inject 0.5 mg into the skin once a week.   Probiotic Product (PROBIOTIC DAILY PO) Take 1 capsule by mouth daily.    No facility-administered encounter medications on file as of 02/13/2024.     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   Thyroid  nodule     Past Surgical History:  Procedure Laterality Date   ABDOMINAL HYSTERECTOMY  2003   BSO   BIOPSY  04/14/2020   Procedure: BIOPSY;  Surgeon: Lanita Pitman, MD;  Location: [facility];  Service: Endoscopy;;   BIOPSY THYROID   11/2009   benign   carpel  tunnel  2004   rt hand   COLONOSCOPY WITH PROPOFOL  N/A 11/08/2016   Procedure: COLONOSCOPY WITH PROPOFOL ;  Surgeon: Lanita Pitman, MD;  Location: [facility];  Service: Endoscopy;  Laterality: N/A;   COLONOSCOPY WITH PROPOFOL  N/A 04/14/2020   Procedure: COLONOSCOPY WITH PROPOFOL ;  Surgeon: Lanita Pitman, MD;  Location: [facility];  Service: Endoscopy;  Laterality: N/A;   ESOPHAGOGASTRODUODENOSCOPY (EGD) WITH PROPOFOL  N/A 04/14/2020   Procedure: ESOPHAGOGASTRODUODENOSCOPY (EGD) WITH PROPOFOL ;  Surgeon: Lanita Pitman, MD;  Location: [facility];  Service: Endoscopy;  Laterality: N/A;   eye biospy  1979   dx with leukemia from this biospy   eye biospy  1979   rt eye, dx: leukemia   EYE SURGERY  1982   rt eye   NASAL SINUS SURGERY  1999   PLANTAR FASCIA SURGERY  2004, 2012   rt foot, left foot   POLYPECTOMY  04/14/2020   Procedure: POLYPECTOMY;  Surgeon: Lanita Pitman, MD;  Location: [facility];  Service: Endoscopy;;   RADICAL HYSTERECTOMY     SCLEROTHERAPY  04/14/2020   Procedure: Daryle Eon;  Surgeon: Lanita Pitman, MD;  Location: [facility];  Service: Endoscopy;;   skin cancer removal on shoulder  2/14   THYROID  LOBECTOMY Left 09/25/2013   Procedure: LEFT THYROID  LOBECTOMY;  Surgeon: Keitha Pata, MD;  Location: [facility];  Service: General;  Laterality: Left;    Family History  Problem Relation Age of Onset   Cancer Mother 57       LUNG   Diabetes Mother    Diabetes Father    Stroke Father    Heart disease Father    Vision loss Father        GLAUCOMA   Cancer Maternal Aunt        breast   Breast cancer Maternal Aunt    Diverticulosis Other    Colon cancer Neg Hx     Social History   Social History Narrative   Not on file     Review of Systems General: Denies fevers, chills, weight loss CV: Denies chest pain, shortness of breath, palpitations Abdomen: Excess skin of fat on the anterior abdominal wall  Physical Exam    02/13/2024   10:13 AM 12/30/2023     9:58 AM 12/30/2023    9:56 AM  Vitals with BMI  Height 5\' 3"     Weight 270 lbs 3 oz    BMI 47.88    Systolic 122 166 191  Diastolic 78 83 97  Pulse 80      General:  No acute distress,  Alert and oriented, Non-Toxic, Normal speech and affect Abdomen patient not examined on this visit Mammogram: January 2025 BI-RADS 2 Assessment/Plan Pannus: Patient with a  large pannus.  Under the best case scenarios she is not a candidate for panniculectomy based on weight alone.  Due to the very high complication rate of this procedure I do not perform panniculectomy is on patients with a BMI greater than 40.  That being said her thrombocytopenia and portal hypertension exclude her is a candidate for any type of surgery especially surgery resecting tissue around the umbilicus.  I discussed this at length with the patient.  She understands that she will not be a candidate for a panniculectomy due to her high surgical risk. 45 minutes spent reviewing the patient's chart, discussing the patient's care with her, and documenting. Teretha Ferguson 02/13/2024, 10:38 AM

## 2024-02-19 ENCOUNTER — Encounter: Admitting: Obstetrics and Gynecology

## 2024-02-20 ENCOUNTER — Other Ambulatory Visit (HOSPITAL_BASED_OUTPATIENT_CLINIC_OR_DEPARTMENT_OTHER): Payer: Self-pay | Admitting: Family Medicine

## 2024-02-20 DIAGNOSIS — E785 Hyperlipidemia, unspecified: Secondary | ICD-10-CM

## 2024-02-20 DIAGNOSIS — M79643 Pain in unspecified hand: Secondary | ICD-10-CM | POA: Diagnosis not present

## 2024-03-25 ENCOUNTER — Ambulatory Visit (HOSPITAL_COMMUNITY)
Admission: RE | Admit: 2024-03-25 | Discharge: 2024-03-25 | Disposition: A | Discharge status: Home / Self Care | Payer: Self-pay | Source: Ambulatory Visit | Attending: Family Medicine | Admitting: Family Medicine

## 2024-03-25 DIAGNOSIS — E785 Hyperlipidemia, unspecified: Secondary | ICD-10-CM | POA: Insufficient documentation

## 2024-04-21 DIAGNOSIS — M79642 Pain in left hand: Secondary | ICD-10-CM | POA: Diagnosis not present

## 2024-04-21 DIAGNOSIS — R768 Other specified abnormal immunological findings in serum: Secondary | ICD-10-CM | POA: Diagnosis not present

## 2024-04-21 DIAGNOSIS — Z6841 Body Mass Index (BMI) 40.0 and over, adult: Secondary | ICD-10-CM | POA: Diagnosis not present

## 2024-04-21 DIAGNOSIS — M79641 Pain in right hand: Secondary | ICD-10-CM | POA: Diagnosis not present

## 2024-04-21 DIAGNOSIS — M7918 Myalgia, other site: Secondary | ICD-10-CM | POA: Diagnosis not present

## 2024-04-21 DIAGNOSIS — D8989 Other specified disorders involving the immune mechanism, not elsewhere classified: Secondary | ICD-10-CM | POA: Diagnosis not present

## 2024-04-21 DIAGNOSIS — M2560 Stiffness of unspecified joint, not elsewhere classified: Secondary | ICD-10-CM | POA: Diagnosis not present

## 2024-04-21 DIAGNOSIS — M2540 Effusion, unspecified joint: Secondary | ICD-10-CM | POA: Diagnosis not present

## 2024-04-21 DIAGNOSIS — R700 Elevated erythrocyte sedimentation rate: Secondary | ICD-10-CM | POA: Diagnosis not present

## 2024-04-30 DIAGNOSIS — K7460 Unspecified cirrhosis of liver: Secondary | ICD-10-CM | POA: Diagnosis not present

## 2024-05-05 DIAGNOSIS — M8588 Other specified disorders of bone density and structure, other site: Secondary | ICD-10-CM | POA: Diagnosis not present

## 2024-05-05 DIAGNOSIS — E7800 Pure hypercholesterolemia, unspecified: Secondary | ICD-10-CM | POA: Diagnosis not present

## 2024-05-05 DIAGNOSIS — C9101 Acute lymphoblastic leukemia, in remission: Secondary | ICD-10-CM | POA: Diagnosis not present

## 2024-05-05 DIAGNOSIS — J45909 Unspecified asthma, uncomplicated: Secondary | ICD-10-CM | POA: Diagnosis not present

## 2024-05-05 DIAGNOSIS — K7460 Unspecified cirrhosis of liver: Secondary | ICD-10-CM | POA: Diagnosis not present

## 2024-05-05 DIAGNOSIS — E538 Deficiency of other specified B group vitamins: Secondary | ICD-10-CM | POA: Diagnosis not present

## 2024-05-05 DIAGNOSIS — E039 Hypothyroidism, unspecified: Secondary | ICD-10-CM | POA: Diagnosis not present

## 2024-05-05 DIAGNOSIS — K921 Melena: Secondary | ICD-10-CM | POA: Diagnosis not present

## 2024-05-05 DIAGNOSIS — E1169 Type 2 diabetes mellitus with other specified complication: Secondary | ICD-10-CM | POA: Diagnosis not present

## 2024-05-05 DIAGNOSIS — M87051 Idiopathic aseptic necrosis of right femur: Secondary | ICD-10-CM | POA: Diagnosis not present

## 2024-05-05 DIAGNOSIS — L989 Disorder of the skin and subcutaneous tissue, unspecified: Secondary | ICD-10-CM | POA: Diagnosis not present

## 2024-05-05 DIAGNOSIS — Z0000 Encounter for general adult medical examination without abnormal findings: Secondary | ICD-10-CM | POA: Diagnosis not present

## 2024-05-06 ENCOUNTER — Telehealth: Payer: Self-pay | Admitting: Hematology and Oncology

## 2024-05-12 DIAGNOSIS — M79642 Pain in left hand: Secondary | ICD-10-CM | POA: Diagnosis not present

## 2024-05-12 DIAGNOSIS — M79641 Pain in right hand: Secondary | ICD-10-CM | POA: Diagnosis not present

## 2024-05-12 DIAGNOSIS — M2560 Stiffness of unspecified joint, not elsewhere classified: Secondary | ICD-10-CM | POA: Diagnosis not present

## 2024-05-12 DIAGNOSIS — Z6841 Body Mass Index (BMI) 40.0 and over, adult: Secondary | ICD-10-CM | POA: Diagnosis not present

## 2024-05-12 DIAGNOSIS — R768 Other specified abnormal immunological findings in serum: Secondary | ICD-10-CM | POA: Diagnosis not present

## 2024-05-13 DIAGNOSIS — N3946 Mixed incontinence: Secondary | ICD-10-CM | POA: Diagnosis not present

## 2024-05-13 DIAGNOSIS — R350 Frequency of micturition: Secondary | ICD-10-CM | POA: Diagnosis not present

## 2024-05-14 ENCOUNTER — Inpatient Hospital Stay: Attending: Hematology and Oncology | Admitting: Hematology and Oncology

## 2024-05-14 ENCOUNTER — Encounter: Payer: Self-pay | Admitting: Hematology and Oncology

## 2024-05-14 VITALS — BP 124/44 | HR 90 | Temp 97.8°F | Resp 18 | Ht 63.0 in | Wt 261.6 lb

## 2024-05-14 DIAGNOSIS — E538 Deficiency of other specified B group vitamins: Secondary | ICD-10-CM | POA: Diagnosis not present

## 2024-05-14 DIAGNOSIS — D696 Thrombocytopenia, unspecified: Secondary | ICD-10-CM | POA: Insufficient documentation

## 2024-05-14 DIAGNOSIS — K7460 Unspecified cirrhosis of liver: Secondary | ICD-10-CM | POA: Insufficient documentation

## 2024-05-14 DIAGNOSIS — Z856 Personal history of leukemia: Secondary | ICD-10-CM | POA: Diagnosis not present

## 2024-05-14 NOTE — Assessment & Plan Note (Addendum)
 She has no clinical signs of cancer recurrence

## 2024-05-14 NOTE — Assessment & Plan Note (Addendum)
 Her progressive thrombocytopenia is likely due to her liver cirrhosis and splenomegaly, well-documented from CT imaging from 2023 Unfortunately, this is not a treatable condition unless her liver cirrhosis stabilized We discussed pathophysiology of sequestration of platelets with splenomegaly I do not believe her recent episode of hematochezia is related to this I plan to see her again in 3 months for further follow-up

## 2024-05-14 NOTE — Progress Notes (Signed)
 [facility] OFFICE PROGRESS NOTE  Patient Care Team: Leonel Cole, MD as PCP - General (Family Medicine) Leslee Reusing, MD as Consulting Physician (Ophthalmology) Lonn Hicks, MD as Consulting Physician (Hematology and Oncology) Georgia Blonder, MD as Consulting Physician (Obstetrics and Gynecology)  Assessment & Plan History of acute lymphoblastic leukemia (ALL) She has no clinical signs of cancer recurrence Thrombocytopenia (HCC) Her progressive thrombocytopenia is likely due to her liver cirrhosis and splenomegaly, well-documented from CT imaging from 2023 Unfortunately, this is not a treatable condition unless her liver cirrhosis stabilized We discussed pathophysiology of sequestration of platelets with splenomegaly I do not believe her recent episode of hematochezia is related to this I plan to see her again in 3 months for further follow-up  Vitamin B12 deficiency Her recent vitamin B12 level was adequate She will continue oral vitamin B12 supplement Cirrhosis of liver without ascites, unspecified hepatic cirrhosis type (HCC) She is known to have liver cirrhosis She has appointment to see a liver specialist at the end of September for medical management She had completed extensive workup with her rheumatologist last month and all her autoimmune screen were negative  Orders Placed This Encounter  Procedures   CBC with Differential (Cancer Center Only)    Standing Status:   Future    Expiration Date:   05/14/2025   CMP (Cancer Center only)    Standing Status:   Future    Expiration Date:   05/14/2025   HIV antibody (with reflex)    Standing Status:   Future    Expiration Date:   05/14/2025     Hicks Lonn, MD  INTERVAL HISTORY: she returns for surveillance follow-up for history of ALL and chronic thrombocytopenia She is here accompanied by her family member I have not seen her for 2 years Most recently, she had lab drawn with her primary care doctor  and her platelet count looks abnormally low On April 30, 2024, her platelet count came back 54, normal white blood cell count of 4.6 and normal hemoglobin of 13.7 Vitamin B-12 was normal at 279, INR 1.0 She had 1 episode of passage of tarry stool 3 weeks ago She denies epistaxis, hematuria or vaginal bleeding Denies recent alcohol intake She saw a rheumatologist end of July; she brought with her copies of test results and all her autoimmune screen was negative.  The date of the blood test was on July 29th, 2025 She is known to have liver cirrhosis with splenomegaly from CT imaging done in 2023 She has appointment to see her liver specialist at [facility] end of September She also complained of pressure wound/slow healing ulcer on her anterior abdominal wall  PHYSICAL EXAMINATION: ECOG PERFORMANCE STATUS: 1 - Symptomatic but completely ambulatory  Vitals:   05/14/24 1105  BP: (!) 124/44  Pulse: 90  Resp: 18  Temp: 97.8 F (36.6 C)  SpO2: 100%   Filed Weights   05/14/24 1105  Weight: 261 lb 9.6 oz (118.7 kg)   Limited examination is performed.  Noted pressure ulcer near the umbilicus that appears to be healing.  It looks dry  Relevant data reviewed during this visit included all test results from review of electronic records

## 2024-05-14 NOTE — Assessment & Plan Note (Addendum)
 She is known to have liver cirrhosis She has appointment to see a liver specialist at the end of September for medical management She had completed extensive workup with her rheumatologist last month and all her autoimmune screen were negative

## 2024-05-14 NOTE — Assessment & Plan Note (Addendum)
 Her recent vitamin B12 level was adequate She will continue oral vitamin B12 supplement

## 2024-05-19 DIAGNOSIS — M533 Sacrococcygeal disorders, not elsewhere classified: Secondary | ICD-10-CM | POA: Diagnosis not present

## 2024-05-19 DIAGNOSIS — D696 Thrombocytopenia, unspecified: Secondary | ICD-10-CM | POA: Diagnosis not present

## 2024-05-19 DIAGNOSIS — M25561 Pain in right knee: Secondary | ICD-10-CM | POA: Diagnosis not present

## 2024-05-19 DIAGNOSIS — T148XXA Other injury of unspecified body region, initial encounter: Secondary | ICD-10-CM | POA: Diagnosis not present

## 2024-05-19 DIAGNOSIS — M25551 Pain in right hip: Secondary | ICD-10-CM | POA: Diagnosis not present

## 2024-05-29 DIAGNOSIS — K7460 Unspecified cirrhosis of liver: Secondary | ICD-10-CM | POA: Diagnosis not present

## 2024-05-29 DIAGNOSIS — K921 Melena: Secondary | ICD-10-CM | POA: Diagnosis not present

## 2024-05-29 DIAGNOSIS — K649 Unspecified hemorrhoids: Secondary | ICD-10-CM | POA: Diagnosis not present

## 2024-05-29 DIAGNOSIS — Z860100 Personal history of colon polyps, unspecified: Secondary | ICD-10-CM | POA: Diagnosis not present

## 2024-05-29 DIAGNOSIS — R188 Other ascites: Secondary | ICD-10-CM | POA: Diagnosis not present

## 2024-06-06 DIAGNOSIS — Z23 Encounter for immunization: Secondary | ICD-10-CM | POA: Diagnosis not present

## 2024-06-22 ENCOUNTER — Other Ambulatory Visit: Payer: Self-pay | Admitting: Nurse Practitioner

## 2024-06-22 DIAGNOSIS — E119 Type 2 diabetes mellitus without complications: Secondary | ICD-10-CM | POA: Diagnosis not present

## 2024-06-22 DIAGNOSIS — K7581 Nonalcoholic steatohepatitis (NASH): Secondary | ICD-10-CM

## 2024-06-22 DIAGNOSIS — Z8379 Family history of other diseases of the digestive system: Secondary | ICD-10-CM | POA: Diagnosis not present

## 2024-06-22 DIAGNOSIS — R748 Abnormal levels of other serum enzymes: Secondary | ICD-10-CM

## 2024-06-22 DIAGNOSIS — K7469 Other cirrhosis of liver: Secondary | ICD-10-CM

## 2024-06-22 DIAGNOSIS — K766 Portal hypertension: Secondary | ICD-10-CM | POA: Diagnosis not present

## 2024-06-22 DIAGNOSIS — K8020 Calculus of gallbladder without cholecystitis without obstruction: Secondary | ICD-10-CM | POA: Diagnosis not present

## 2024-06-26 ENCOUNTER — Ambulatory Visit
Admission: RE | Admit: 2024-06-26 | Discharge: 2024-06-26 | Disposition: A | Discharge status: Home / Self Care | Source: Ambulatory Visit | Attending: Nurse Practitioner | Admitting: Nurse Practitioner

## 2024-06-26 DIAGNOSIS — R748 Abnormal levels of other serum enzymes: Secondary | ICD-10-CM

## 2024-06-26 DIAGNOSIS — K7469 Other cirrhosis of liver: Secondary | ICD-10-CM

## 2024-06-26 DIAGNOSIS — K7581 Nonalcoholic steatohepatitis (NASH): Secondary | ICD-10-CM

## 2024-06-26 DIAGNOSIS — K7460 Unspecified cirrhosis of liver: Secondary | ICD-10-CM | POA: Diagnosis not present

## 2024-06-26 MED ORDER — IOPAMIDOL (ISOVUE-300) INJECTION 61%
100.0000 mL | Freq: Once | INTRAVENOUS | Status: AC | PRN
  Administered 2024-06-26: 100 mL via INTRAVENOUS

## 2024-07-21 DIAGNOSIS — E1169 Type 2 diabetes mellitus with other specified complication: Secondary | ICD-10-CM | POA: Diagnosis not present

## 2024-07-21 DIAGNOSIS — K8020 Calculus of gallbladder without cholecystitis without obstruction: Secondary | ICD-10-CM | POA: Diagnosis not present

## 2024-07-21 DIAGNOSIS — E039 Hypothyroidism, unspecified: Secondary | ICD-10-CM | POA: Diagnosis not present

## 2024-07-21 DIAGNOSIS — K7460 Unspecified cirrhosis of liver: Secondary | ICD-10-CM | POA: Diagnosis not present

## 2024-07-21 DIAGNOSIS — Z6841 Body Mass Index (BMI) 40.0 and over, adult: Secondary | ICD-10-CM | POA: Diagnosis not present

## 2024-07-21 DIAGNOSIS — D696 Thrombocytopenia, unspecified: Secondary | ICD-10-CM | POA: Diagnosis not present

## 2024-07-29 DIAGNOSIS — H52203 Unspecified astigmatism, bilateral: Secondary | ICD-10-CM | POA: Diagnosis not present

## 2024-07-29 DIAGNOSIS — E119 Type 2 diabetes mellitus without complications: Secondary | ICD-10-CM | POA: Diagnosis not present

## 2024-07-30 DIAGNOSIS — M25511 Pain in right shoulder: Secondary | ICD-10-CM | POA: Diagnosis not present

## 2024-07-30 DIAGNOSIS — G8929 Other chronic pain: Secondary | ICD-10-CM | POA: Diagnosis not present

## 2024-07-30 DIAGNOSIS — M25561 Pain in right knee: Secondary | ICD-10-CM | POA: Diagnosis not present

## 2024-07-30 DIAGNOSIS — M25562 Pain in left knee: Secondary | ICD-10-CM | POA: Diagnosis not present

## 2024-08-04 ENCOUNTER — Other Ambulatory Visit: Payer: Self-pay | Admitting: Family Medicine

## 2024-08-04 DIAGNOSIS — Z1231 Encounter for screening mammogram for malignant neoplasm of breast: Secondary | ICD-10-CM

## 2024-08-05 DIAGNOSIS — D2272 Melanocytic nevi of left lower limb, including hip: Secondary | ICD-10-CM | POA: Diagnosis not present

## 2024-08-05 DIAGNOSIS — L2089 Other atopic dermatitis: Secondary | ICD-10-CM | POA: Diagnosis not present

## 2024-08-05 DIAGNOSIS — D485 Neoplasm of uncertain behavior of skin: Secondary | ICD-10-CM | POA: Diagnosis not present

## 2024-08-05 DIAGNOSIS — B079 Viral wart, unspecified: Secondary | ICD-10-CM | POA: Diagnosis not present

## 2024-08-05 DIAGNOSIS — Z85828 Personal history of other malignant neoplasm of skin: Secondary | ICD-10-CM | POA: Diagnosis not present

## 2024-08-05 DIAGNOSIS — D1801 Hemangioma of skin and subcutaneous tissue: Secondary | ICD-10-CM | POA: Diagnosis not present

## 2024-08-05 DIAGNOSIS — L821 Other seborrheic keratosis: Secondary | ICD-10-CM | POA: Diagnosis not present

## 2024-08-05 DIAGNOSIS — L570 Actinic keratosis: Secondary | ICD-10-CM | POA: Diagnosis not present

## 2024-08-05 DIAGNOSIS — L814 Other melanin hyperpigmentation: Secondary | ICD-10-CM | POA: Diagnosis not present

## 2024-08-10 ENCOUNTER — Inpatient Hospital Stay: Admitting: Hematology and Oncology

## 2024-08-10 ENCOUNTER — Inpatient Hospital Stay: Attending: Hematology and Oncology

## 2024-08-10 ENCOUNTER — Encounter: Payer: Self-pay | Admitting: Hematology and Oncology

## 2024-08-10 VITALS — BP 119/57 | HR 87 | Temp 97.8°F | Resp 18 | Ht 63.0 in | Wt 268.8 lb

## 2024-08-10 DIAGNOSIS — Z856 Personal history of leukemia: Secondary | ICD-10-CM | POA: Diagnosis not present

## 2024-08-10 DIAGNOSIS — D696 Thrombocytopenia, unspecified: Secondary | ICD-10-CM

## 2024-08-10 DIAGNOSIS — D6959 Other secondary thrombocytopenia: Secondary | ICD-10-CM | POA: Diagnosis not present

## 2024-08-10 DIAGNOSIS — R300 Dysuria: Secondary | ICD-10-CM | POA: Diagnosis not present

## 2024-08-10 DIAGNOSIS — K7460 Unspecified cirrhosis of liver: Secondary | ICD-10-CM | POA: Diagnosis not present

## 2024-08-10 LAB — CBC WITH DIFFERENTIAL (CANCER CENTER ONLY)
Abs Immature Granulocytes: 0.01 K/uL (ref 0.00–0.07)
Basophils Absolute: 0 K/uL (ref 0.0–0.1)
Basophils Relative: 0 %
Eosinophils Absolute: 0.1 K/uL (ref 0.0–0.5)
Eosinophils Relative: 2 %
HCT: 39.9 % (ref 36.0–46.0)
Hemoglobin: 13.9 g/dL (ref 12.0–15.0)
Immature Granulocytes: 0 %
Lymphocytes Relative: 23 %
Lymphs Abs: 1.2 K/uL (ref 0.7–4.0)
MCH: 31.5 pg (ref 26.0–34.0)
MCHC: 34.8 g/dL (ref 30.0–36.0)
MCV: 90.5 fL (ref 80.0–100.0)
Monocytes Absolute: 0.4 K/uL (ref 0.1–1.0)
Monocytes Relative: 8 %
Neutro Abs: 3.5 K/uL (ref 1.7–7.7)
Neutrophils Relative %: 67 %
Platelet Count: 71 K/uL — ABNORMAL LOW (ref 150–400)
RBC: 4.41 MIL/uL (ref 3.87–5.11)
RDW: 13.4 % (ref 11.5–15.5)
WBC Count: 5.3 K/uL (ref 4.0–10.5)
nRBC: 0 % (ref 0.0–0.2)

## 2024-08-10 LAB — CMP (CANCER CENTER ONLY)
ALT: 8 U/L (ref 0–44)
AST: 21 U/L (ref 15–41)
Albumin: 3.9 g/dL (ref 3.5–5.0)
Alkaline Phosphatase: 117 U/L (ref 38–126)
Anion gap: 5 (ref 5–15)
BUN: 13 mg/dL (ref 6–20)
CO2: 29 mmol/L (ref 22–32)
Calcium: 9 mg/dL (ref 8.9–10.3)
Chloride: 106 mmol/L (ref 98–111)
Creatinine: 0.67 mg/dL (ref 0.44–1.00)
GFR, Estimated: >60 mL/min (ref >60)
Glucose, Bld: 103 mg/dL — ABNORMAL HIGH (ref 70–99)
Potassium: 3.8 mmol/L (ref 3.5–5.1)
Sodium: 140 mmol/L (ref 135–145)
Total Bilirubin: 1.3 mg/dL — ABNORMAL HIGH (ref 0.0–1.2)
Total Protein: 6.4 g/dL — ABNORMAL LOW (ref 6.5–8.1)

## 2024-08-10 NOTE — Assessment & Plan Note (Addendum)
 The patient had childhood ALL and was treated successfully She has no signs of reoccurrence

## 2024-08-10 NOTE — Assessment & Plan Note (Addendum)
 Her progressive thrombocytopenia is likely due to her liver cirrhosis and splenomegaly, well-documented from CT imaging from 2023 I have reviewed CT imaging from October 2025 which show persistent splenomegaly and liver cirrhosis We discussed pathophysiology of sequestration of platelets with splenomegaly Platelet count from today is stable overall I will continue surveillance once a year

## 2024-08-10 NOTE — Progress Notes (Signed)
 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 She has no signs of reoccurrence Thrombocytopenia Her progressive thrombocytopenia is likely due to her liver cirrhosis and splenomegaly, well-documented from CT imaging from 2023 I have reviewed CT imaging from October 2025 which show persistent splenomegaly and liver cirrhosis We discussed pathophysiology of sequestration of platelets with splenomegaly Platelet count from today is stable overall I will continue surveillance once a year   Orders Placed This Encounter  Procedures   CBC with Differential (Cancer Center Only)    Standing Status:   Future    Expiration Date:   08/10/2025     Hicks Lonn, MD  INTERVAL HISTORY: she returns for surveillance follow-up for chronic thrombocytopenia in the setting of liver cirrhosis and splenomegaly Patient denies recent bleeding such as epistaxis, hematuria or hematochezia We reviewed medication list and discussed medication changes We discussed test results and future plan of care as outlined above  PHYSICAL EXAMINATION: ECOG PERFORMANCE STATUS: 0 - Asymptomatic  Vitals:   08/10/24 1241  BP: (!) 119/57  Pulse: 87  Resp: 18  Temp: 97.8 F (36.6 C)  SpO2: 97%   Lab Results  Component Value Date   WBC 5.3 08/10/2024   HGB 13.9 08/10/2024   HCT 39.9 08/10/2024   MCV 90.5 08/10/2024   PLT 71 (L) 08/10/2024   SUMMARY OF HEMATOLOGIC HISTORY: The patient had remote history of ALL as a child and was treated Subsequently, she was found to have progressive thrombocytopenia over the past 2 years On April 30, 2024, her platelet count came back 54, normal white blood  cell count of 4.6 and normal hemoglobin of 13.7 Vitamin B-12 was normal at 279, INR 1.0 She had 1 episode of passage of tarry stool 3 weeks ago She denies epistaxis, hematuria or vaginal bleeding Denies recent alcohol intake She has appointment to see her liver specialist at [facility] end of September I reviewed CT imaging from October 2025 which show signs of liver cirrhosis and splenomegaly

## 2024-08-13 LAB — MISC LABCORP TEST (SEND OUT): Labcorp test code: 83935

## 2024-09-08 DIAGNOSIS — E039 Hypothyroidism, unspecified: Secondary | ICD-10-CM | POA: Diagnosis not present

## 2024-09-08 DIAGNOSIS — Z6841 Body Mass Index (BMI) 40.0 and over, adult: Secondary | ICD-10-CM | POA: Diagnosis not present

## 2024-09-08 DIAGNOSIS — D696 Thrombocytopenia, unspecified: Secondary | ICD-10-CM | POA: Diagnosis not present

## 2024-09-08 DIAGNOSIS — K7460 Unspecified cirrhosis of liver: Secondary | ICD-10-CM | POA: Diagnosis not present

## 2024-09-08 DIAGNOSIS — K8020 Calculus of gallbladder without cholecystitis without obstruction: Secondary | ICD-10-CM | POA: Diagnosis not present

## 2024-09-08 DIAGNOSIS — E1169 Type 2 diabetes mellitus with other specified complication: Secondary | ICD-10-CM | POA: Diagnosis not present

## 2024-09-10 ENCOUNTER — Inpatient Hospital Stay: Admission: RE | Admit: 2024-09-10 | Discharge: 2024-09-10 | Attending: Family Medicine | Admitting: Family Medicine

## 2024-09-10 DIAGNOSIS — Z1231 Encounter for screening mammogram for malignant neoplasm of breast: Secondary | ICD-10-CM | POA: Diagnosis not present

## 2024-10-05 ENCOUNTER — Other Ambulatory Visit: Payer: Self-pay | Admitting: Gastroenterology

## 2024-10-06 ENCOUNTER — Encounter (HOSPITAL_COMMUNITY): Payer: Self-pay | Admitting: Gastroenterology

## 2024-10-13 ENCOUNTER — Other Ambulatory Visit: Payer: Self-pay | Admitting: Gastroenterology

## 2024-10-14 ENCOUNTER — Encounter (HOSPITAL_COMMUNITY): Payer: Self-pay | Admitting: Gastroenterology

## 2024-10-14 ENCOUNTER — Other Ambulatory Visit: Payer: Self-pay

## 2024-10-14 ENCOUNTER — Ambulatory Visit (HOSPITAL_COMMUNITY)
Admission: RE | Admit: 2024-10-14 | Discharge: 2024-10-14 | Disposition: A | Discharge status: Home / Self Care | Attending: Gastroenterology | Admitting: Gastroenterology

## 2024-10-14 ENCOUNTER — Encounter: Admission: RE | Payer: Self-pay

## 2024-10-14 ENCOUNTER — Ambulatory Visit (HOSPITAL_COMMUNITY): Admitting: Anesthesiology

## 2024-10-14 ENCOUNTER — Encounter (HOSPITAL_COMMUNITY)
Admission: RE | Disposition: A | Discharge status: Home / Self Care | Payer: Self-pay | Source: Home / Self Care | Attending: Gastroenterology

## 2024-10-14 DIAGNOSIS — K317 Polyp of stomach and duodenum: Secondary | ICD-10-CM | POA: Insufficient documentation

## 2024-10-14 DIAGNOSIS — Z87891 Personal history of nicotine dependence: Secondary | ICD-10-CM | POA: Diagnosis not present

## 2024-10-14 DIAGNOSIS — K644 Residual hemorrhoidal skin tags: Secondary | ICD-10-CM | POA: Diagnosis not present

## 2024-10-14 DIAGNOSIS — K219 Gastro-esophageal reflux disease without esophagitis: Secondary | ICD-10-CM | POA: Insufficient documentation

## 2024-10-14 DIAGNOSIS — K766 Portal hypertension: Secondary | ICD-10-CM | POA: Diagnosis not present

## 2024-10-14 DIAGNOSIS — K449 Diaphragmatic hernia without obstruction or gangrene: Secondary | ICD-10-CM | POA: Diagnosis not present

## 2024-10-14 DIAGNOSIS — D124 Benign neoplasm of descending colon: Secondary | ICD-10-CM | POA: Diagnosis not present

## 2024-10-14 DIAGNOSIS — E119 Type 2 diabetes mellitus without complications: Secondary | ICD-10-CM | POA: Insufficient documentation

## 2024-10-14 DIAGNOSIS — K3189 Other diseases of stomach and duodenum: Secondary | ICD-10-CM | POA: Diagnosis not present

## 2024-10-14 DIAGNOSIS — D123 Benign neoplasm of transverse colon: Secondary | ICD-10-CM | POA: Insufficient documentation

## 2024-10-14 DIAGNOSIS — K7460 Unspecified cirrhosis of liver: Secondary | ICD-10-CM | POA: Diagnosis not present

## 2024-10-14 DIAGNOSIS — Z1211 Encounter for screening for malignant neoplasm of colon: Secondary | ICD-10-CM | POA: Insufficient documentation

## 2024-10-14 SURGERY — COLONOSCOPY
Anesthesia: Monitor Anesthesia Care

## 2024-10-14 MED ORDER — SODIUM CHLORIDE 0.9 % IV SOLN: INTRAVENOUS | Status: DC | PRN

## 2024-10-14 MED ORDER — ONDANSETRON HCL 4 MG/2ML IJ SOLN
INTRAMUSCULAR | Status: DC | PRN
  Administered 2024-10-14: 4 mg via INTRAVENOUS

## 2024-10-14 MED ORDER — LIDOCAINE 2% (20 MG/ML) 5 ML SYRINGE
INTRAMUSCULAR | Status: DC | PRN
  Administered 2024-10-14: 80 mg via INTRAVENOUS

## 2024-10-14 MED ORDER — PROPOFOL 10 MG/ML IV BOLUS
INTRAVENOUS | Status: DC | PRN
  Administered 2024-10-14 (×3): 50 mg via INTRAVENOUS

## 2024-10-14 MED ORDER — SODIUM CHLORIDE 0.9 % IV SOLN
INTRAVENOUS | Status: AC | PRN
  Administered 2024-10-14: 500 mL via INTRAMUSCULAR

## 2024-10-14 MED ORDER — PROPOFOL 500 MG/50ML IV EMUL
INTRAVENOUS | Status: DC | PRN
  Administered 2024-10-14: 125 ug/kg/min via INTRAVENOUS

## 2024-10-14 NOTE — Op Note (Signed)
 [facility] Patient Name: Marissa [name] Procedure Date: 10/14/2024 MRN: [id number] Attending MD: Elsie Cree , MD, [id number] Date of Birth: [birth year] CSN: [id number] Age: 59 Admit Type: Outpatient Procedure:                Upper GI endoscopy Indications:              Follow-up of gastric polyps, cirrhosis Providers:                Elsie Cree, MD, Darleene Bare, RN, Haskel Chris, Technician Referring MD:              Medicines:                Monitored Anesthesia Care Complications:            No immediate complications. Estimated Blood Loss:     Estimated blood loss: none. Procedure:                Pre-Anesthesia Assessment:                           - Prior to the procedure, a History and Physical                            was performed, and patient medications and                            allergies were reviewed. The patient's tolerance of                            previous anesthesia was also reviewed. The risks                            and benefits of the procedure and the sedation                            options and risks were discussed with the patient.                            All questions were answered, and informed consent                            was obtained. Prior Anticoagulants: The patient has                            taken no anticoagulant or antiplatelet agents. ASA                            Grade Assessment: III - A patient with severe                            systemic disease. After reviewing the risks and  benefits, the patient was deemed in satisfactory                            condition to undergo the procedure.                           After obtaining informed consent, the endoscope was                            passed under direct vision. Throughout the                            procedure, the patient's blood pressure, pulse, and                             oxygen saturations were monitored continuously. The                            [id number]-[id number] ([id number]) Olympus endoscope was introduced                            through the mouth, and advanced to the second part                            of duodenum. The upper GI endoscopy was                            accomplished without difficulty. The patient                            tolerated the procedure well. Scope In: Scope Out: Findings:      The examined esophagus was normal. No varices.      Mild portal hypertensive gastropathy was found in the entire examined       stomach. Otherwise normal stomach; no varices.      Multiple medium sessile polyps with no stigmata of recent bleeding were       found in the gastric body, in the gastric antrum and in the prepyloric       region of the stomach. Biopsies were taken with a cold forceps for       histology.      The duodenal bulb, first portion of the duodenum and second portion of       the duodenum were normal. Impression:               - Normal esophagus.                           - Portal hypertensive gastropathy.                           - Multiple gastric polyps. Biopsied.                           - Normal duodenal bulb, first portion of the  duodenum and second portion of the duodenum. Moderate Sedation:      None Recommendation:           - Await pathology results.                           - Perform a colonoscopy today. Procedure Code(s):        --- Professional ---                           [phone], Esophagogastroduodenoscopy, flexible,                            transoral; with biopsy, single or multiple Diagnosis Code(s):        --- Professional ---                           K76.6, Portal hypertension                           K31.89, Other diseases of stomach and duodenum                           K31.7, Polyp of stomach and duodenum CPT copyright 2022 American Medical Association. All rights  reserved. The codes documented in this report are preliminary and upon coder review may  be revised to meet current compliance requirements. Elsie Cree, MD 10/14/2024 9:22:45 AM This report has been signed electronically. Number of Addenda: 0

## 2024-10-14 NOTE — Transfer of Care (Signed)
 Immediate Anesthesia Transfer of Care Note  Patient: Marissa [name]  Procedure(s) Performed: EGD (ESOPHAGOGASTRODUODENOSCOPY) COLONOSCOPY BIOPSY, SKIN, SUBCUTANEOUS TISSUE, OR MUCOUS MEMBRANE POLYPECTOMY, INTESTINE  Patient Location: PACU  Anesthesia Type:MAC  Level of Consciousness: awake and alert   Airway & Oxygen Therapy: Patient Spontanous Breathing and Patient connected to nasal cannula oxygen  Post-op Assessment: Report given to RN and Post -op Vital signs reviewed and stable  Post vital signs: Reviewed and stable  Last Vitals:  Vitals Value Taken Time  BP    Temp    Pulse    Resp    SpO2      Last Pain:  Vitals:   10/14/24 0732  TempSrc: Temporal  PainSc: 0-No pain         Complications: No notable events documented.

## 2024-10-14 NOTE — Op Note (Signed)
 [facility] Patient Name: Marissa [name] Procedure Date: 10/14/2024 MRN: [id number] Attending MD: Elsie Cree , MD, [id number] Date of Birth: 11-Aug-1966 CSN: [id number] Age: 59 Admit Type: Outpatient Procedure:                Colonoscopy Indications:              High risk colon cancer surveillance: Personal                            history of colonic polyps Providers:                Elsie Cree, MD, Darleene Bare, RN, Haskel Chris, Technician Referring MD:              Medicines:                Monitored Anesthesia Care Complications:            No immediate complications. Estimated Blood Loss:     Estimated blood loss: none. Procedure:                Pre-Anesthesia Assessment:                           - Prior to the procedure, a History and Physical                            was performed, and patient medications and                            allergies were reviewed. The patient's tolerance of                            previous anesthesia was also reviewed. The risks                            and benefits of the procedure and the sedation                            options and risks were discussed with the patient.                            All questions were answered, and informed consent                            was obtained. Prior Anticoagulants: The patient has                            taken no anticoagulant or antiplatelet agents. ASA                            Grade Assessment: III - A patient with severe                            systemic  disease. After reviewing the risks and                            benefits, the patient was deemed in satisfactory                            condition to undergo the procedure.                           - Prior to the procedure, a History and Physical                            was performed, and patient medications and                            allergies were reviewed. The  patient's tolerance of                            previous anesthesia was also reviewed. The risks                            and benefits of the procedure and the sedation                            options and risks were discussed with the patient.                            All questions were answered, and informed consent                            was obtained. Prior Anticoagulants: The patient has                            taken no anticoagulant or antiplatelet agents. ASA                            Grade Assessment: III - A patient with severe                            systemic disease. After reviewing the risks and                            benefits, the patient was deemed in satisfactory                            condition to undergo the procedure.                           After obtaining informed consent, the colonoscope                            was passed under direct vision. Throughout the  procedure, the patient's blood pressure, pulse, and                            oxygen saturations were monitored continuously. The                            [id number] ([id number]) Olympus colonoscope was                            introduced through the anus and advanced to the the                            cecum, identified by appendiceal orifice and                            ileocecal valve. The colonoscopy was performed                            without difficulty. The patient tolerated the                            procedure well. The quality of the bowel                            preparation was good. The ileocecal valve,                            appendiceal orifice, and rectum were photographed. Scope In: 8:55:38 AM Scope Out: 9:11:51 AM Scope Withdrawal Time: 0 hours 12 minutes 46 seconds  Total Procedure Duration: 0 hours 16 minutes 13 seconds  Findings:      Hemorrhoids were found on perianal exam.      A 7 mm polyp was found in the transverse  colon. The polyp was sessile.       The polyp was removed with a hot snare. Resection and retrieval were       complete.      A 3 mm polyp was found in the descending colon. The polyp was sessile.       The polyp was removed with a cold biopsy forceps. Resection and       retrieval were complete.      The exam was otherwise without abnormality on direct and retroflexion       views. Impression:               - Hemorrhoids found on perianal exam.                           - One 7 mm polyp in the transverse colon, removed                            with a hot snare. Resected and retrieved.                           - One 3 mm polyp in the descending colon, removed  with a cold biopsy forceps. Resected and retrieved.                           - The examination was otherwise normal on direct                            and retroflexion views.                           - The examination was otherwise normal. Moderate Sedation:      Not Applicable - Patient had care per Anesthesia. Recommendation:           - Patient has a contact number available for                            emergencies. The signs and symptoms of potential                            delayed complications were discussed with the                            patient. Return to normal activities tomorrow.                            Written discharge instructions were provided to the                            patient.                           - Discharge patient to home (via wheelchair).                           - Resume previous diet today.                           - Continue present medications.                           - Await pathology results.                           - Repeat colonoscopy (date not yet determined) for                            surveillance based on pathology results.                           - Return to GI clinic after studies are complete.                           -  Return to referring physician as previously                            scheduled. Procedure Code(s):        --- Professional ---  54614, Colonoscopy, flexible; with removal of                            tumor(s), polyp(s), or other lesion(s) by snare                            technique                           45380, 59, Colonoscopy, flexible; with biopsy,                            single or multiple Diagnosis Code(s):        --- Professional ---                           Z86.010, Personal history of colonic polyps                           D12.3, Benign neoplasm of transverse colon (hepatic                            flexure or splenic flexure)                           D12.4, Benign neoplasm of descending colon                           K64.9, Unspecified hemorrhoids CPT copyright 2022 American Medical Association. All rights reserved. The codes documented in this report are preliminary and upon coder review may  be revised to meet current compliance requirements. Elsie Cree, MD 10/14/2024 9:25:25 AM This report has been signed electronically. Number of Addenda: 0

## 2024-10-14 NOTE — Anesthesia Preprocedure Evaluation (Signed)
"                                    Anesthesia Evaluation  Patient identified by MRN, date of birth, ID band Patient awake    Reviewed: Allergy & Precautions, H&P , NPO status , Patient's Chart, lab work & pertinent test results  Airway Mallampati: II   Neck ROM: full    Dental   Pulmonary asthma , former smoker   breath sounds clear to auscultation       Cardiovascular negative cardio ROS  Rhythm:regular Rate:Normal     Neuro/Psych    GI/Hepatic hiatal hernia,GERD  ,,(+) Cirrhosis         Endo/Other  diabetes  Class 3 obesity  Renal/GU      Musculoskeletal   Abdominal   Peds  Hematology   Anesthesia Other Findings   Reproductive/Obstetrics                              Anesthesia Physical Anesthesia Plan  ASA: 2  Anesthesia Plan: MAC   Post-op Pain Management:    Induction: Intravenous  PONV Risk Score and Plan: 2 and Propofol  infusion and Treatment may vary due to age or medical condition  Airway Management Planned: Nasal Cannula  Additional Equipment:   Intra-op Plan:   Post-operative Plan:   Informed Consent: I have reviewed the patients History and Physical, chart, labs and discussed the procedure including the risks, benefits and alternatives for the proposed anesthesia with the patient or authorized representative who has indicated his/her understanding and acceptance.     Dental advisory given  Plan Discussed with: CRNA, Anesthesiologist and Surgeon  Anesthesia Plan Comments:         Anesthesia Quick Evaluation  "

## 2024-10-14 NOTE — Discharge Instructions (Signed)
YOU HAD AN ENDOSCOPIC PROCEDURE TODAY: Refer to the procedure report and other information in the discharge instructions given to you for any specific questions about what was found during the examination. If this information does not answer your questions, please call [facility] office at [phone] to clarify.   YOU SHOULD EXPECT: Some feelings of bloating in the abdomen. Passage of more gas than usual. Walking can help get rid of the air that was put into your GI tract during the procedure and reduce the bloating. If you had a lower endoscopy (such as a colonoscopy or flexible sigmoidoscopy) you may notice spotting of blood in your stool or on the toilet paper. Some abdominal soreness may be present for a day or two, also.  DIET: Your first meal following the procedure should be a light meal and then it is ok to progress to your normal diet. A half-sandwich or bowl of soup is an example of a good first meal. Heavy or fried foods are harder to digest and may make you feel nauseous or bloated. Drink plenty of fluids but you should avoid alcoholic beverages for 24 hours.    ACTIVITY: Your care partner should take you home directly after the procedure. You should plan to take it easy, moving slowly for the rest of the day. You can resume normal activity the day after the procedure however YOU SHOULD NOT DRIVE, use power tools, machinery or perform tasks that involve climbing or major physical exertion for 24 hours (because of the sedation medicines used during the test).   SYMPTOMS TO REPORT IMMEDIATELY: A gastroenterologist can be reached at any hour. Please call [phone]  for any of the following symptoms:  . Following lower endoscopy (colonoscopy, flexible sigmoidoscopy) Excessive amounts of blood in the stool  Significant tenderness, worsening of abdominal pains  Swelling of the abdomen that is new, acute  Fever of 100 or higher  . Following upper endoscopy (EGD, EUS, ERCP, esophageal  dilation) Vomiting of blood or coffee ground material  New, significant abdominal pain  New, significant chest pain or pain under the shoulder blades  Painful or persistently difficult swallowing  New shortness of breath  Black, tarry-looking or red, bloody stools  FOLLOW UP:  If any biopsies were taken you will be contacted by phone or by letter within the next 1-3 weeks. Call [phone]  if you have not heard about the biopsies in 3 weeks.  Please also call with any specific questions about appointments or follow up tests.  

## 2024-10-14 NOTE — H&P (Signed)
 [facility] H/P Note  Chief Complaint: cirrhosis; gastric polyps; personal history colonic polyps.  HPI: Marissa [name] is an 59 y.o. female.  Here for endoscopy and colonoscopy for above indications.  Is losing weight, intentionally.  No abdominal pain, nausea, vomiting, blood in stool.  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   Thyroid  nodule     Past Surgical History:  Procedure Laterality Date   ABDOMINAL HYSTERECTOMY  2003   BSO   BIOPSY  04/14/2020   Procedure: BIOPSY;  Surgeon: Donnald Charleston, MD;  Location: [facility];  Service: Endoscopy;;   BIOPSY THYROID   11/2009   benign   carpel tunnel  2004   rt hand   COLONOSCOPY WITH PROPOFOL  N/A 11/08/2016   Procedure: COLONOSCOPY WITH PROPOFOL ;  Surgeon: Charleston Donnald, MD;  Location: [facility];  Service: Endoscopy;  Laterality: N/A;   COLONOSCOPY WITH PROPOFOL  N/A 04/14/2020   Procedure: COLONOSCOPY WITH PROPOFOL ;  Surgeon: Donnald Charleston, MD;  Location: [facility];  Service: Endoscopy;  Laterality: N/A;   ESOPHAGOGASTRODUODENOSCOPY (EGD) WITH PROPOFOL  N/A 04/14/2020   Procedure: ESOPHAGOGASTRODUODENOSCOPY (EGD) WITH PROPOFOL ;  Surgeon: Donnald Charleston, MD;  Location: [facility];  Service: Endoscopy;  Laterality: N/A;   eye biospy  1979   dx with leukemia from this biospy   eye biospy  1979   rt eye, dx: leukemia   EYE SURGERY  1982   rt eye   NASAL SINUS SURGERY  1999   PLANTAR FASCIA SURGERY  2004, 2012   rt foot, left foot   POLYPECTOMY  04/14/2020   Procedure: POLYPECTOMY;  Surgeon:  Donnald Charleston, MD;  Location: [facility];  Service: Endoscopy;;   RADICAL HYSTERECTOMY     SCLEROTHERAPY  04/14/2020   Procedure: MATIAS;  Surgeon: Donnald Charleston, MD;  Location: [facility];  Service: Endoscopy;;   skin cancer removal on shoulder  2/14   THYROID  LOBECTOMY Left 09/25/2013   Procedure: LEFT THYROID  LOBECTOMY;  Surgeon: Krystal CHRISTELLA Spinner, MD;  Location: [facility];  Service: General;  Laterality: Left;    Medications Prior to Admission  Medication Sig Dispense Refill   Cranberry-Vitamin C-Vitamin E (CRANBERRY PLUS VITAMIN C PO) Take 1 tablet by mouth daily.      cyclobenzaprine  (FLEXERIL ) 10 MG tablet Take 1 tablet (10 mg total) by mouth 3 (three) times daily as needed for muscle spasms. 90 tablet 3   fexofenadine (ALLEGRA) 180 MG tablet Take 180 mg by mouth daily.      fluticasone (FLONASE) 50 MCG/ACT nasal spray Place 2 sprays into both nostrils daily.     levothyroxine  (SYNTHROID ) 75 MCG tablet TAKE 1 TABLET EVERY DAY BEFORE BREAKFAST 90 tablet 1   montelukast (SINGULAIR) 10 MG tablet Take 10 mg by mouth at bedtime.   5   Multiple Vitamins-Minerals (CENTRUM ADULT PO) Take 1 capsule by mouth daily.     pantoprazole  (PROTONIX ) 40 MG tablet TAKE 1 TABLET BY MOUTH  EVERY DAY 90 tablet 3   phentermine  (ADIPEX-P ) 37.5 MG tablet Take 37.5 mg by mouth daily before breakfast.     Psyllium Fiber 0.52 g CAPS 2 capsules with 8 ounces of liquid Orally twice a day     traMADol  (ULTRAM ) 50 MG tablet Take 1 tablet (50 mg total) by mouth at bedtime as needed. 30 tablet 0   vitamin B-12 (CYANOCOBALAMIN ) 500 MCG tablet Take 500 mcg by mouth daily.     ARNUITY ELLIPTA 100 MCG/ACT AEPB Inhale 1 puff into the lungs daily. (Patient not taking: Reported on 10/14/2024)     ferrous sulfate 325 (65 FE) MG tablet Take 325 mg by mouth every other day.     furosemide  (LASIX ) 20 MG tablet TAKE 1 TABLET BY MOUTH EVERY MORNING AND 1 TAB AT NOON AS NEEDED FOR EDEMA 180 tablet 1   hydrocortisone 2.5 % cream  Apply 1 Application topically daily as needed.     potassium chloride  (KLOR-CON  M10) 10 MEQ tablet TAKE 1 TABLET BY MOUTH TWICE A DAY WHEN TAKING LASIX  180 tablet 1   PROAIR  HFA 108 (90 BASE) MCG/ACT inhaler Inhale 1-2 puffs into the lungs every 6 (six) hours as needed for wheezing or shortness of breath.  (Patient not taking: Reported on 10/14/2024)  0    Allergies: Allergies[1]  Family History  Problem Relation Age of Onset   Cancer Mother 73       LUNG   Diabetes Mother    Diabetes Father    Stroke Father    Heart disease Father    Vision loss Father        GLAUCOMA   Cancer Maternal Aunt        breast   Breast cancer Maternal Aunt    Diverticulosis Other    Colon cancer Neg Hx     Social History:  reports that she quit smoking about 33 years ago. Her smoking use included cigarettes. She has never used smokeless tobacco. She reports current alcohol use. She reports that she does not use drugs.   ROS: As per HPI, all others negative   Pulse 75, temperature (!) 96.9 F (36.1 C), temperature source Temporal, resp. rate 12, height 5' 3 (1.6 m), weight 119.7 kg, last menstrual period 07/23/2002, SpO2 98%. General appearance: Obese but noticeably thinner over past several months HEENT:  [place]/AT, anicteric NECK:  Thick, supple LUNGS:  No visible distress CV:  No tachycardia ABD:  Redundant pannus; soft, non-tender NEURO:  No encephalopathy   No results found for this or any previous visit (from the past 48 hours). No results found.  Assessment/Plan   Cirrhosis.  Endoscopy needed for variceal surveillance. Hyperplastic gastric polyps. Personal history colon polyps.  Colonoscopy needed for polyp surveillance. Risks (bleeding, infection, bowel perforation that could require surgery, sedation-related changes in cardiopulmonary systems), benefits (identification and possible treatment of source of symptoms, exclusion of certain causes of symptoms), and alternatives (watchful  waiting, radiographic imaging studies, empiric medical treatment) of upper endoscopy (EGD) were explained to patient/family in detail and patient wishes to proceed.  Risks (bleeding, infection, bowel perforation that could require surgery, sedation-related changes in cardiopulmonary systems), benefits (identification and possible treatment of source of symptoms, exclusion of certain causes of symptoms), and alternatives (watchful waiting, radiographic imaging studies, empiric medical treatment) of colonoscopy were explained to patient/family in detail and patient wishes to proceed.   Marissa [name] 10/14/2024, 8:02 AM     [1]  Allergies Allergen Reactions   Ranell Beck [  Fluticasone Furoate-Vilanterol] Anaphylaxis   Erythromycin Shortness Of Breath   Penicillins Shortness Of Breath    Has patient had a PCN reaction causing immediate rash, facial/tongue/throat swelling, SOB or lightheadedness with hypotension: Yes Has patient had a PCN reaction causing severe rash involving mucus membranes or skin necrosis: Yes Has patient had a PCN reaction that required hospitalization Unknown Has patient had a PCN reaction occurring within the last 10 years: No If all of the above answers are NO, then may proceed with Cephalosporin use.    Quinine Derivatives Shortness Of Breath   Sulfa Antibiotics Shortness Of Breath

## 2024-10-14 NOTE — Anesthesia Postprocedure Evaluation (Signed)
"   Anesthesia Post Note  Patient: Marissa [name]  Procedure(s) Performed: EGD (ESOPHAGOGASTRODUODENOSCOPY) COLONOSCOPY BIOPSY, SKIN, SUBCUTANEOUS TISSUE, OR MUCOUS MEMBRANE POLYPECTOMY, INTESTINE     Patient location during evaluation: Endoscopy Anesthesia Type: MAC Level of consciousness: awake and alert Pain management: pain level controlled Vital Signs Assessment: post-procedure vital signs reviewed and stable Respiratory status: spontaneous breathing, nonlabored ventilation, respiratory function stable and patient connected to nasal cannula oxygen Cardiovascular status: stable and blood pressure returned to baseline Postop Assessment: no apparent nausea or vomiting Anesthetic complications: no   No notable events documented.  Last Vitals:  Vitals:   10/14/24 0930 10/14/24 0940  BP: 118/61 124/67  Pulse: 86 78  Resp: (!) 23 18  Temp:    SpO2: 98% 98%    Last Pain:  Vitals:   10/14/24 0940  TempSrc:   PainSc: 5                  Vonzell Lindblad S      "

## 2024-10-15 LAB — SURGICAL PATHOLOGY

## 2024-10-16 ENCOUNTER — Encounter (HOSPITAL_COMMUNITY): Payer: Self-pay | Admitting: Gastroenterology

## 2025-08-10 ENCOUNTER — Inpatient Hospital Stay: Admitting: Hematology and Oncology

## 2025-08-10 ENCOUNTER — Inpatient Hospital Stay
